# Patient Record
Sex: Female | Born: 1935
Health system: Southern US, Community
[De-identification: ages and names within clinical notes are randomized; demographics above are authoritative.]

## PROBLEM LIST (undated history)

## (undated) DIAGNOSIS — IMO0002 Reserved for concepts with insufficient information to code with codable children: Secondary | ICD-10-CM

## (undated) DIAGNOSIS — G4730 Sleep apnea, unspecified: Secondary | ICD-10-CM

## (undated) DIAGNOSIS — A048 Other specified bacterial intestinal infections: Secondary | ICD-10-CM

## (undated) DIAGNOSIS — K5790 Diverticulosis of intestine, part unspecified, without perforation or abscess without bleeding: Secondary | ICD-10-CM

## (undated) DIAGNOSIS — E039 Hypothyroidism, unspecified: Secondary | ICD-10-CM

## (undated) DIAGNOSIS — K219 Gastro-esophageal reflux disease without esophagitis: Secondary | ICD-10-CM

## (undated) DIAGNOSIS — I10 Essential (primary) hypertension: Secondary | ICD-10-CM

## (undated) DIAGNOSIS — K589 Irritable bowel syndrome without diarrhea: Secondary | ICD-10-CM

## (undated) DIAGNOSIS — J309 Allergic rhinitis, unspecified: Secondary | ICD-10-CM

## (undated) DIAGNOSIS — E782 Mixed hyperlipidemia: Secondary | ICD-10-CM

## (undated) DIAGNOSIS — G5600 Carpal tunnel syndrome, unspecified upper limb: Secondary | ICD-10-CM

## (undated) DIAGNOSIS — J45909 Unspecified asthma, uncomplicated: Secondary | ICD-10-CM

## (undated) DIAGNOSIS — K580 Irritable bowel syndrome with diarrhea: Secondary | ICD-10-CM

## (undated) DIAGNOSIS — M8580 Other specified disorders of bone density and structure, unspecified site: Secondary | ICD-10-CM

## (undated) DIAGNOSIS — R748 Abnormal levels of other serum enzymes: Secondary | ICD-10-CM

## (undated) DIAGNOSIS — R7989 Other specified abnormal findings of blood chemistry: Secondary | ICD-10-CM

## (undated) HISTORY — DX: Irritable bowel syndrome with diarrhea: K58.0

## (undated) HISTORY — DX: Hypothyroidism, unspecified: E03.9

## (undated) HISTORY — PX: UVULECTOMY: SHX2631

## (undated) HISTORY — DX: Gastro-esophageal reflux disease without esophagitis: K21.9

## (undated) HISTORY — DX: Essential (primary) hypertension: I10

## (undated) HISTORY — PX: DILATION AND CURETTAGE OF UTERUS: SHX78

## (undated) HISTORY — DX: Carpal tunnel syndrome, unspecified upper limb: G56.00

## (undated) HISTORY — PX: BREAST BIOPSY: SHX20

## (undated) HISTORY — DX: Other specified abnormal findings of blood chemistry: R79.89

## (undated) HISTORY — DX: Other specified disorders of bone density and structure, unspecified site: M85.80

## (undated) HISTORY — PX: TONSILLECTOMY AND ADENOIDECTOMY: SUR1326

## (undated) HISTORY — DX: Unspecified asthma, uncomplicated: J45.909

## (undated) HISTORY — DX: Irritable bowel syndrome, unspecified: K58.9

## (undated) HISTORY — DX: Mixed hyperlipidemia: E78.2

## (undated) HISTORY — DX: Other specified bacterial intestinal infections: A04.8

## (undated) HISTORY — PX: COLONOSCOPY: SHX174

## (undated) HISTORY — DX: Abnormal levels of other serum enzymes: R74.8

## (undated) HISTORY — DX: Diverticulosis of intestine, part unspecified, without perforation or abscess without bleeding: K57.90

## (undated) HISTORY — DX: Reserved for concepts with insufficient information to code with codable children: IMO0002

## (undated) HISTORY — DX: Sleep apnea, unspecified: G47.30

## (undated) HISTORY — PX: OTHER SURGICAL HISTORY: SHX169

## (undated) HISTORY — DX: Allergic rhinitis, unspecified: J30.9

---

## 1998-01-02 ENCOUNTER — Other Ambulatory Visit: Admission: RE | Admit: 1998-01-02 | Discharge: 1998-01-02 | Payer: Self-pay | Admitting: Obstetrics & Gynecology

## 1998-10-06 ENCOUNTER — Emergency Department (HOSPITAL_COMMUNITY): Admission: EM | Admit: 1998-10-06 | Discharge: 1998-10-06 | Payer: Self-pay | Admitting: Emergency Medicine

## 1999-02-13 ENCOUNTER — Other Ambulatory Visit: Admission: RE | Admit: 1999-02-13 | Discharge: 1999-02-13 | Payer: Self-pay | Admitting: Obstetrics & Gynecology

## 2000-10-27 ENCOUNTER — Other Ambulatory Visit: Admission: RE | Admit: 2000-10-27 | Discharge: 2000-10-27 | Payer: Self-pay | Admitting: Obstetrics & Gynecology

## 2001-12-18 ENCOUNTER — Other Ambulatory Visit: Admission: RE | Admit: 2001-12-18 | Discharge: 2001-12-18 | Payer: Self-pay | Admitting: Obstetrics & Gynecology

## 2003-02-15 ENCOUNTER — Other Ambulatory Visit: Admission: RE | Admit: 2003-02-15 | Discharge: 2003-02-15 | Payer: Self-pay | Admitting: Obstetrics & Gynecology

## 2004-01-31 ENCOUNTER — Encounter: Payer: Self-pay | Admitting: Internal Medicine

## 2004-04-24 ENCOUNTER — Encounter: Admission: RE | Admit: 2004-04-24 | Discharge: 2004-04-24 | Payer: Self-pay | Admitting: Obstetrics & Gynecology

## 2004-07-10 ENCOUNTER — Ambulatory Visit: Payer: Self-pay | Admitting: Internal Medicine

## 2004-07-24 ENCOUNTER — Ambulatory Visit: Payer: Self-pay | Admitting: Internal Medicine

## 2004-08-23 ENCOUNTER — Ambulatory Visit: Payer: Self-pay | Admitting: Internal Medicine

## 2004-10-19 ENCOUNTER — Ambulatory Visit: Payer: Self-pay | Admitting: Gastroenterology

## 2004-10-23 ENCOUNTER — Ambulatory Visit: Payer: Self-pay | Admitting: Internal Medicine

## 2004-10-31 ENCOUNTER — Ambulatory Visit: Payer: Self-pay | Admitting: Gastroenterology

## 2005-01-01 ENCOUNTER — Ambulatory Visit: Payer: Self-pay | Admitting: Internal Medicine

## 2005-02-15 ENCOUNTER — Ambulatory Visit: Payer: Self-pay | Admitting: Internal Medicine

## 2005-03-01 ENCOUNTER — Ambulatory Visit: Payer: Self-pay | Admitting: Internal Medicine

## 2005-06-19 ENCOUNTER — Other Ambulatory Visit: Admission: RE | Admit: 2005-06-19 | Discharge: 2005-06-19 | Payer: Self-pay | Admitting: Obstetrics & Gynecology

## 2005-09-05 ENCOUNTER — Ambulatory Visit: Payer: Self-pay | Admitting: Internal Medicine

## 2005-09-05 ENCOUNTER — Encounter: Admission: RE | Admit: 2005-09-05 | Discharge: 2005-09-05 | Payer: Self-pay | Admitting: Internal Medicine

## 2005-11-27 ENCOUNTER — Ambulatory Visit: Payer: Self-pay | Admitting: Internal Medicine

## 2005-12-22 ENCOUNTER — Emergency Department (HOSPITAL_COMMUNITY): Admission: EM | Admit: 2005-12-22 | Discharge: 2005-12-22 | Payer: Self-pay | Admitting: Family Medicine

## 2005-12-26 ENCOUNTER — Ambulatory Visit: Payer: Self-pay | Admitting: Internal Medicine

## 2006-06-19 ENCOUNTER — Ambulatory Visit: Payer: Self-pay | Admitting: Internal Medicine

## 2006-06-19 LAB — CONVERTED CEMR LAB
Basophils Relative: 0.8 % (ref 0.0–1.0)
Cholesterol: 168 mg/dL (ref 0–200)
HCT: 43.1 % (ref 36.0–46.0)
Hemoglobin: 14.2 g/dL (ref 12.0–15.0)
LDL Cholesterol: 90 mg/dL (ref 0–99)
MCHC: 33 g/dL (ref 30.0–36.0)
Monocytes Absolute: 0.4 10*3/uL (ref 0.2–0.7)
Neutro Abs: 3.4 10*3/uL (ref 1.4–7.7)
Neutrophils Relative %: 52.3 % (ref 43.0–77.0)
RBC: 4.61 M/uL (ref 3.87–5.11)
RDW: 12.9 % (ref 11.5–14.6)
WBC: 6.6 10*3/uL (ref 4.5–10.5)

## 2006-10-31 ENCOUNTER — Ambulatory Visit: Payer: Self-pay | Admitting: Internal Medicine

## 2006-11-05 ENCOUNTER — Ambulatory Visit: Payer: Self-pay | Admitting: Internal Medicine

## 2006-11-05 LAB — CONVERTED CEMR LAB
Basophils Absolute: 0 10*3/uL (ref 0.0–0.1)
Eosinophils Absolute: 0 10*3/uL (ref 0.0–0.6)
HCT: 42.5 % (ref 36.0–46.0)
Hemoglobin: 14.6 g/dL (ref 12.0–15.0)
Lymphocytes Relative: 8.2 % — ABNORMAL LOW (ref 12.0–46.0)
MCHC: 34.3 g/dL (ref 30.0–36.0)
MCV: 92.2 fL (ref 78.0–100.0)
Monocytes Relative: 4.8 % (ref 3.0–11.0)
Platelets: 307 10*3/uL (ref 150–400)
RBC: 4.61 M/uL (ref 3.87–5.11)

## 2006-11-06 ENCOUNTER — Encounter: Payer: Self-pay | Admitting: Internal Medicine

## 2006-11-24 DIAGNOSIS — G5600 Carpal tunnel syndrome, unspecified upper limb: Secondary | ICD-10-CM | POA: Insufficient documentation

## 2006-11-24 DIAGNOSIS — J45909 Unspecified asthma, uncomplicated: Secondary | ICD-10-CM

## 2006-12-09 ENCOUNTER — Ambulatory Visit: Payer: Self-pay | Admitting: Internal Medicine

## 2006-12-09 ENCOUNTER — Ambulatory Visit (HOSPITAL_BASED_OUTPATIENT_CLINIC_OR_DEPARTMENT_OTHER): Admission: RE | Admit: 2006-12-09 | Discharge: 2006-12-09 | Payer: Self-pay | Admitting: Internal Medicine

## 2006-12-16 ENCOUNTER — Ambulatory Visit: Payer: Self-pay | Admitting: Pulmonary Disease

## 2006-12-22 ENCOUNTER — Emergency Department (HOSPITAL_COMMUNITY): Admission: EM | Admit: 2006-12-22 | Discharge: 2006-12-22 | Payer: Self-pay | Admitting: Emergency Medicine

## 2006-12-24 ENCOUNTER — Ambulatory Visit: Payer: Self-pay | Admitting: Internal Medicine

## 2007-01-02 ENCOUNTER — Encounter: Admission: RE | Admit: 2007-01-02 | Discharge: 2007-02-09 | Payer: Self-pay | Admitting: Internal Medicine

## 2007-01-20 ENCOUNTER — Encounter: Payer: Self-pay | Admitting: Internal Medicine

## 2007-01-21 ENCOUNTER — Ambulatory Visit: Payer: Self-pay | Admitting: Internal Medicine

## 2007-01-21 DIAGNOSIS — IMO0002 Reserved for concepts with insufficient information to code with codable children: Secondary | ICD-10-CM

## 2007-01-25 ENCOUNTER — Encounter: Admission: RE | Admit: 2007-01-25 | Discharge: 2007-01-25 | Payer: Self-pay | Admitting: Internal Medicine

## 2007-01-28 ENCOUNTER — Telehealth (INDEPENDENT_AMBULATORY_CARE_PROVIDER_SITE_OTHER): Payer: Self-pay | Admitting: *Deleted

## 2007-01-28 DIAGNOSIS — R6889 Other general symptoms and signs: Secondary | ICD-10-CM | POA: Insufficient documentation

## 2007-02-05 ENCOUNTER — Encounter: Payer: Self-pay | Admitting: Internal Medicine

## 2007-02-06 ENCOUNTER — Telehealth (INDEPENDENT_AMBULATORY_CARE_PROVIDER_SITE_OTHER): Payer: Self-pay | Admitting: *Deleted

## 2007-02-09 ENCOUNTER — Telehealth (INDEPENDENT_AMBULATORY_CARE_PROVIDER_SITE_OTHER): Payer: Self-pay | Admitting: *Deleted

## 2007-02-18 ENCOUNTER — Telehealth (INDEPENDENT_AMBULATORY_CARE_PROVIDER_SITE_OTHER): Payer: Self-pay | Admitting: *Deleted

## 2007-02-19 ENCOUNTER — Encounter: Payer: Self-pay | Admitting: Internal Medicine

## 2007-03-18 ENCOUNTER — Encounter: Payer: Self-pay | Admitting: Internal Medicine

## 2007-03-31 ENCOUNTER — Ambulatory Visit: Payer: Self-pay | Admitting: Pulmonary Disease

## 2007-05-06 ENCOUNTER — Ambulatory Visit: Payer: Self-pay | Admitting: Pulmonary Disease

## 2007-05-14 ENCOUNTER — Ambulatory Visit: Payer: Self-pay | Admitting: Internal Medicine

## 2007-05-14 DIAGNOSIS — I10 Essential (primary) hypertension: Secondary | ICD-10-CM

## 2007-05-14 DIAGNOSIS — E039 Hypothyroidism, unspecified: Secondary | ICD-10-CM

## 2007-05-14 LAB — CONVERTED CEMR LAB
Cholesterol, target level: 200 mg/dL
LDL Goal: 130 mg/dL
TSH: 3.03 microintl units/mL (ref 0.35–5.50)

## 2007-05-15 ENCOUNTER — Encounter (INDEPENDENT_AMBULATORY_CARE_PROVIDER_SITE_OTHER): Payer: Self-pay | Admitting: *Deleted

## 2007-06-04 ENCOUNTER — Ambulatory Visit: Payer: Self-pay | Admitting: Internal Medicine

## 2007-06-09 ENCOUNTER — Encounter: Payer: Self-pay | Admitting: Internal Medicine

## 2007-06-09 DIAGNOSIS — G4730 Sleep apnea, unspecified: Secondary | ICD-10-CM | POA: Insufficient documentation

## 2007-07-03 ENCOUNTER — Encounter: Payer: Self-pay | Admitting: Internal Medicine

## 2007-07-06 ENCOUNTER — Telehealth (INDEPENDENT_AMBULATORY_CARE_PROVIDER_SITE_OTHER): Payer: Self-pay | Admitting: *Deleted

## 2007-07-24 ENCOUNTER — Ambulatory Visit: Payer: Self-pay | Admitting: Internal Medicine

## 2007-07-27 LAB — CONVERTED CEMR LAB
AST: 40 units/L — ABNORMAL HIGH (ref 0–37)
Cholesterol: 177 mg/dL (ref 0–200)
LDL Cholesterol: 96 mg/dL (ref 0–99)
Total CHOL/HDL Ratio: 3.7

## 2007-07-31 ENCOUNTER — Ambulatory Visit: Payer: Self-pay | Admitting: Internal Medicine

## 2007-07-31 DIAGNOSIS — E782 Mixed hyperlipidemia: Secondary | ICD-10-CM | POA: Insufficient documentation

## 2007-08-18 ENCOUNTER — Encounter (INDEPENDENT_AMBULATORY_CARE_PROVIDER_SITE_OTHER): Payer: Self-pay | Admitting: *Deleted

## 2007-09-03 ENCOUNTER — Ambulatory Visit: Payer: Self-pay | Admitting: Gastroenterology

## 2007-09-03 LAB — CONVERTED CEMR LAB
ALT: 21 units/L (ref 0–35)
AST: 27 units/L (ref 0–37)
Albumin: 4.1 g/dL (ref 3.5–5.2)
Alkaline Phosphatase: 68 units/L (ref 39–117)
Basophils Absolute: 0.1 10*3/uL (ref 0.0–0.1)
Basophils Relative: 0.9 % (ref 0.0–1.0)
Bilirubin, Direct: 0.1 mg/dL (ref 0.0–0.3)
Eosinophils Absolute: 0.1 10*3/uL (ref 0.0–0.6)
Eosinophils Relative: 1.4 % (ref 0.0–5.0)
Hemoglobin: 13.8 g/dL (ref 12.0–15.0)
Iron: 90 ug/dL (ref 42–145)
Lymphocytes Relative: 30.2 % (ref 12.0–46.0)
MCHC: 35.1 g/dL (ref 30.0–36.0)
Monocytes Absolute: 0.5 10*3/uL (ref 0.2–0.7)
Monocytes Relative: 7 % (ref 3.0–11.0)
Tissue Transglutaminase Ab, IgA: 0.4 units (ref <7)
Total Protein: 7.2 g/dL (ref 6.0–8.3)
Transferrin: 287.4 mg/dL (ref >=212.0)
Vitamin B-12: 315 pg/mL (ref 211–911)
WBC: 6.7 10*3/uL (ref 4.5–10.5)

## 2007-09-18 ENCOUNTER — Ambulatory Visit: Payer: Self-pay | Admitting: Gastroenterology

## 2007-12-07 ENCOUNTER — Ambulatory Visit: Payer: Self-pay | Admitting: Internal Medicine

## 2007-12-07 LAB — CONVERTED CEMR LAB
Albumin: 3.9 g/dL (ref 3.5–5.2)
Alkaline Phosphatase: 67 units/L (ref 39–117)
Bilirubin, Direct: 0.1 mg/dL (ref 0.0–0.3)
Cholesterol: 165 mg/dL (ref 0–200)
Creatinine, Ser: 0.8 mg/dL (ref 0.4–1.2)
GFR calc Af Amer: 90 mL/min
HDL: 45.3 mg/dL (ref >39.0)
Total Bilirubin: 0.7 mg/dL (ref 0.3–1.2)
Total CHOL/HDL Ratio: 3.6
Total Protein: 6.9 g/dL (ref 6.0–8.3)
VLDL: 28 mg/dL (ref 0–40)

## 2007-12-14 ENCOUNTER — Ambulatory Visit: Payer: Self-pay | Admitting: Internal Medicine

## 2007-12-24 ENCOUNTER — Telehealth: Payer: Self-pay | Admitting: Gastroenterology

## 2008-03-24 ENCOUNTER — Telehealth (INDEPENDENT_AMBULATORY_CARE_PROVIDER_SITE_OTHER): Payer: Self-pay | Admitting: *Deleted

## 2008-03-24 ENCOUNTER — Encounter: Payer: Self-pay | Admitting: Internal Medicine

## 2008-05-02 ENCOUNTER — Encounter (INDEPENDENT_AMBULATORY_CARE_PROVIDER_SITE_OTHER): Payer: Self-pay | Admitting: *Deleted

## 2008-05-30 ENCOUNTER — Telehealth (INDEPENDENT_AMBULATORY_CARE_PROVIDER_SITE_OTHER): Payer: Self-pay | Admitting: *Deleted

## 2008-06-07 ENCOUNTER — Ambulatory Visit: Payer: Self-pay | Admitting: Gastroenterology

## 2008-06-08 ENCOUNTER — Telehealth: Payer: Self-pay | Admitting: Gastroenterology

## 2008-06-22 ENCOUNTER — Telehealth: Payer: Self-pay | Admitting: Gastroenterology

## 2008-08-01 ENCOUNTER — Telehealth (INDEPENDENT_AMBULATORY_CARE_PROVIDER_SITE_OTHER): Payer: Self-pay | Admitting: *Deleted

## 2008-08-19 DIAGNOSIS — A048 Other specified bacterial intestinal infections: Secondary | ICD-10-CM

## 2008-08-19 HISTORY — DX: Other specified bacterial intestinal infections: A04.8

## 2008-08-24 ENCOUNTER — Telehealth (INDEPENDENT_AMBULATORY_CARE_PROVIDER_SITE_OTHER): Payer: Self-pay | Admitting: *Deleted

## 2008-08-25 ENCOUNTER — Telehealth (INDEPENDENT_AMBULATORY_CARE_PROVIDER_SITE_OTHER): Payer: Self-pay | Admitting: *Deleted

## 2008-08-26 ENCOUNTER — Ambulatory Visit: Payer: Self-pay | Admitting: Internal Medicine

## 2008-08-29 ENCOUNTER — Telehealth (INDEPENDENT_AMBULATORY_CARE_PROVIDER_SITE_OTHER): Payer: Self-pay | Admitting: *Deleted

## 2008-09-02 ENCOUNTER — Encounter: Payer: Self-pay | Admitting: Internal Medicine

## 2008-09-14 ENCOUNTER — Telehealth (INDEPENDENT_AMBULATORY_CARE_PROVIDER_SITE_OTHER): Payer: Self-pay | Admitting: *Deleted

## 2008-10-04 ENCOUNTER — Telehealth (INDEPENDENT_AMBULATORY_CARE_PROVIDER_SITE_OTHER): Payer: Self-pay | Admitting: *Deleted

## 2008-10-12 ENCOUNTER — Telehealth (INDEPENDENT_AMBULATORY_CARE_PROVIDER_SITE_OTHER): Payer: Self-pay | Admitting: *Deleted

## 2008-10-20 ENCOUNTER — Telehealth: Payer: Self-pay | Admitting: Gastroenterology

## 2008-10-21 ENCOUNTER — Ambulatory Visit: Payer: Self-pay | Admitting: Gastroenterology

## 2008-10-21 DIAGNOSIS — K589 Irritable bowel syndrome without diarrhea: Secondary | ICD-10-CM

## 2008-10-24 ENCOUNTER — Encounter: Payer: Self-pay | Admitting: Nurse Practitioner

## 2008-10-25 ENCOUNTER — Encounter: Payer: Self-pay | Admitting: Nurse Practitioner

## 2008-10-26 ENCOUNTER — Encounter: Payer: Self-pay | Admitting: Nurse Practitioner

## 2008-11-01 ENCOUNTER — Ambulatory Visit: Payer: Self-pay | Admitting: Internal Medicine

## 2008-11-02 LAB — CONVERTED CEMR LAB: TSH: 2.56 microintl units/mL (ref 0.35–5.50)

## 2008-11-03 ENCOUNTER — Encounter (INDEPENDENT_AMBULATORY_CARE_PROVIDER_SITE_OTHER): Payer: Self-pay | Admitting: *Deleted

## 2008-11-08 ENCOUNTER — Ambulatory Visit: Payer: Self-pay | Admitting: Gastroenterology

## 2008-11-08 DIAGNOSIS — K219 Gastro-esophageal reflux disease without esophagitis: Secondary | ICD-10-CM | POA: Insufficient documentation

## 2008-11-09 ENCOUNTER — Telehealth: Payer: Self-pay | Admitting: Gastroenterology

## 2008-11-14 ENCOUNTER — Encounter: Payer: Self-pay | Admitting: Gastroenterology

## 2008-11-14 ENCOUNTER — Ambulatory Visit: Payer: Self-pay | Admitting: Gastroenterology

## 2008-11-14 LAB — HM COLONOSCOPY

## 2008-11-16 ENCOUNTER — Encounter: Payer: Self-pay | Admitting: Gastroenterology

## 2008-11-22 ENCOUNTER — Telehealth: Payer: Self-pay | Admitting: Gastroenterology

## 2008-11-23 ENCOUNTER — Ambulatory Visit (HOSPITAL_COMMUNITY): Admission: RE | Admit: 2008-11-23 | Discharge: 2008-11-23 | Payer: Self-pay | Admitting: Gastroenterology

## 2008-11-28 ENCOUNTER — Encounter: Payer: Self-pay | Admitting: Gastroenterology

## 2008-12-05 ENCOUNTER — Telehealth: Payer: Self-pay | Admitting: Gastroenterology

## 2008-12-06 ENCOUNTER — Telehealth (INDEPENDENT_AMBULATORY_CARE_PROVIDER_SITE_OTHER): Payer: Self-pay | Admitting: *Deleted

## 2008-12-07 ENCOUNTER — Encounter: Payer: Self-pay | Admitting: Internal Medicine

## 2008-12-30 ENCOUNTER — Telehealth (INDEPENDENT_AMBULATORY_CARE_PROVIDER_SITE_OTHER): Payer: Self-pay | Admitting: *Deleted

## 2009-01-13 ENCOUNTER — Ambulatory Visit: Payer: Self-pay | Admitting: Internal Medicine

## 2009-01-13 DIAGNOSIS — R198 Other specified symptoms and signs involving the digestive system and abdomen: Secondary | ICD-10-CM

## 2009-01-27 ENCOUNTER — Telehealth (INDEPENDENT_AMBULATORY_CARE_PROVIDER_SITE_OTHER): Payer: Self-pay | Admitting: *Deleted

## 2009-02-10 ENCOUNTER — Telehealth (INDEPENDENT_AMBULATORY_CARE_PROVIDER_SITE_OTHER): Payer: Self-pay | Admitting: *Deleted

## 2009-03-08 ENCOUNTER — Telehealth: Payer: Self-pay | Admitting: Gastroenterology

## 2009-04-17 ENCOUNTER — Telehealth (INDEPENDENT_AMBULATORY_CARE_PROVIDER_SITE_OTHER): Payer: Self-pay | Admitting: *Deleted

## 2009-04-18 ENCOUNTER — Ambulatory Visit: Payer: Self-pay | Admitting: Internal Medicine

## 2009-04-18 DIAGNOSIS — J309 Allergic rhinitis, unspecified: Secondary | ICD-10-CM | POA: Insufficient documentation

## 2009-04-25 ENCOUNTER — Encounter (INDEPENDENT_AMBULATORY_CARE_PROVIDER_SITE_OTHER): Payer: Self-pay | Admitting: *Deleted

## 2009-04-25 LAB — CONVERTED CEMR LAB
Basophils Absolute: 0.1 10*3/uL (ref 0.0–0.1)
Basophils Relative: 0.8 % (ref 0.0–3.0)
Eosinophils Relative: 0.6 % (ref 0.0–5.0)
HCT: 40.3 % (ref 36.0–46.0)
Lymphs Abs: 2.2 10*3/uL (ref 0.7–4.0)
MCV: 94.6 fL (ref 78.0–100.0)
Monocytes Relative: 5.7 % (ref 3.0–12.0)
Neutro Abs: 4.5 10*3/uL (ref 1.4–7.7)
RDW: 13.3 % (ref 11.5–14.6)

## 2010-04-04 ENCOUNTER — Ambulatory Visit: Payer: Self-pay | Admitting: Internal Medicine

## 2010-04-12 ENCOUNTER — Telehealth (INDEPENDENT_AMBULATORY_CARE_PROVIDER_SITE_OTHER): Payer: Self-pay | Admitting: *Deleted

## 2010-04-20 ENCOUNTER — Telehealth: Payer: Self-pay | Admitting: Internal Medicine

## 2010-05-10 ENCOUNTER — Telehealth (INDEPENDENT_AMBULATORY_CARE_PROVIDER_SITE_OTHER): Payer: Self-pay | Admitting: *Deleted

## 2010-05-15 ENCOUNTER — Telehealth (INDEPENDENT_AMBULATORY_CARE_PROVIDER_SITE_OTHER): Payer: Self-pay | Admitting: *Deleted

## 2010-06-05 ENCOUNTER — Telehealth (INDEPENDENT_AMBULATORY_CARE_PROVIDER_SITE_OTHER): Payer: Self-pay | Admitting: *Deleted

## 2010-06-05 ENCOUNTER — Encounter: Payer: Self-pay | Admitting: Internal Medicine

## 2010-06-05 DIAGNOSIS — M949 Disorder of cartilage, unspecified: Secondary | ICD-10-CM

## 2010-06-05 DIAGNOSIS — M899 Disorder of bone, unspecified: Secondary | ICD-10-CM | POA: Insufficient documentation

## 2010-06-12 ENCOUNTER — Telehealth (INDEPENDENT_AMBULATORY_CARE_PROVIDER_SITE_OTHER): Payer: Self-pay | Admitting: *Deleted

## 2010-06-13 ENCOUNTER — Encounter: Payer: Self-pay | Admitting: Internal Medicine

## 2010-06-19 ENCOUNTER — Ambulatory Visit: Payer: Self-pay | Admitting: Internal Medicine

## 2010-06-25 LAB — CONVERTED CEMR LAB
AST: 32 units/L (ref 0–37)
Alkaline Phosphatase: 65 units/L (ref 39–117)
Bilirubin, Direct: 0.1 mg/dL (ref 0.0–0.3)
Total CHOL/HDL Ratio: 3
Total CK: 91 units/L (ref 7–177)
Triglycerides: 115 mg/dL (ref 0.0–149.0)

## 2010-06-26 ENCOUNTER — Ambulatory Visit: Payer: Self-pay | Admitting: Internal Medicine

## 2010-09-16 LAB — CONVERTED CEMR LAB
ALT: 17 units/L (ref 0–35)
Albumin: 4.3 g/dL (ref 3.5–5.2)
BUN: 14 mg/dL (ref 6–23)
Basophils Relative: 0.8 % (ref 0.0–3.0)
Bilirubin, Direct: 0.1 mg/dL (ref 0.0–0.3)
Eosinophils Absolute: 0 10*3/uL (ref 0.0–0.7)
GFR calc non Af Amer: 68.93 mL/min (ref >60)
Glucose, Bld: 95 mg/dL (ref 70–99)
HCT: 40.4 % (ref 36.0–46.0)
HDL: 49.7 mg/dL (ref >39.00)
Hemoglobin: 13.6 g/dL (ref 12.0–15.0)
Lymphs Abs: 2.3 10*3/uL (ref 0.7–4.0)
Monocytes Absolute: 0.4 10*3/uL (ref 0.1–1.0)
Monocytes Relative: 5.6 % (ref 3.0–12.0)
Neutro Abs: 3.8 10*3/uL (ref 1.4–7.7)
Neutrophils Relative %: 58.2 % (ref 43.0–77.0)
Platelets: 249 10*3/uL (ref 150.0–400.0)
RBC: 4.24 M/uL (ref 3.87–5.11)
Sodium: 143 meq/L (ref 135–145)
Total CHOL/HDL Ratio: 6
Total Protein: 7.2 g/dL (ref 6.0–8.3)
VLDL: 38.8 mg/dL (ref 0.0–40.0)

## 2010-09-18 ENCOUNTER — Other Ambulatory Visit: Payer: Self-pay | Admitting: Internal Medicine

## 2010-09-18 ENCOUNTER — Ambulatory Visit
Admission: RE | Admit: 2010-09-18 | Discharge: 2010-09-18 | Payer: Self-pay | Source: Home / Self Care | Attending: Internal Medicine | Admitting: Internal Medicine

## 2010-09-18 LAB — LIPID PANEL
Cholesterol: 192 mg/dL (ref 0–200)
HDL: 52 mg/dL (ref >39.00)
Triglycerides: 160 mg/dL — ABNORMAL HIGH (ref 0.0–149.0)

## 2010-09-18 LAB — HEPATIC FUNCTION PANEL
ALT: 19 U/L (ref 0–35)
AST: 26 U/L (ref 0–37)
Albumin: 4 g/dL (ref 3.5–5.2)
Alkaline Phosphatase: 57 U/L (ref 39–117)

## 2010-09-18 NOTE — Progress Notes (Signed)
Summary: Order request  Phone Note From Other Clinic   Summary of Call: Order Request for BMD, patient was at the breast center getting mammogram and requested Bone Density also.  Order faxed to [phone]  Shonna Chock CMA  June 05, 2010 2:52 PM   New Problems: OSTEOPENIA (ICD-733.90)   New Problems: OSTEOPENIA (ICD-733.90)

## 2010-09-18 NOTE — Progress Notes (Signed)
Summary: refill   Phone Note Refill Request Message from:  Fax from Pharmacy on May 10, 2010 11:57 AM  Refills Requested: Medication #1:  CARDIZEM CD 120 MG XR24H-CAP take 1 tab once daily [facility] - fax [phone]  Initial call taken by: Okey Regal Spring,  May 10, 2010 11:58 AM    Prescriptions: CARDIZEM CD 120 MG XR24H-CAP (DILTIAZEM HCL COATED BEADS) take 1 tab once daily  #90 Each x 3   Entered by:   Shonna Chock CMA   Authorized by:   Marga Melnick MD   Signed by:   Shonna Chock CMA on 05/10/2010   Method used:   Electronically to        [facility]* (retail)       [address]       [place], [place]  [address]       Ph: [phone]       Fax: [phone]   RxID:   [id number]

## 2010-09-18 NOTE — Progress Notes (Signed)
Summary: change med back  Phone Note Call from Patient   Caller: Patient Summary of Call: Pt states that she has found a way to take ZOCOR 40 MG TABLET and has been taking this med for the past few weeks with no adverse reaction. pt states that she is very hesitate to take the welchol due to some of the side effect it has list on the insert. pt also wanted to let dr hopper know that when previously on this med she was taking it in the morning which could have contribute to her adverse reaction. pls advise ok to change med back.............Marland KitchenFelecia Deloach CMA  April 20, 2010 10:17 AM    Follow-up for Phone Call        Instead of Welchol; continue Simvastatin 40 mg as she is doing. Check fasting labs after 10 weeks & see me 2-3 days later(272.4,995.20; lipids, hepatic panel, CPK) Follow-up by: Marga Melnick MD,  April 20, 2010 11:06 AM  Additional Follow-up for Phone Call Additional follow up Details #1::        pt aware, appt scheduled already............Marland KitchenFelecia Deloach CMA  April 20, 2010 11:49 AM     New/Updated Medications: ZOCOR 40 MG TABS (SIMVASTATIN) Take 1 tab at bedtime

## 2010-09-18 NOTE — Progress Notes (Signed)
Summary: refill  Phone Note Call from Patient Call back at Home Phone [phone]   Caller: Patient Summary of Call: pt left vm that she just received labs in mail but wanted to let dr hopper know that she was not fasting when labs were drawn. pt is however interesting in starting the welchol. dr hopper pls advise...........Marland KitchenFelecia Deloach CMA  April 12, 2010 10:54 AM   Follow-up for Phone Call        check FASTNG lipids, CK & hepatic panel 10 weeks after meds started(272.4,995.20) Follow-up by: Marga Melnick MD,  April 12, 2010 1:01 PM  Additional Follow-up for Phone Call Additional follow up Details #1::        patient is scheduled for lab [id number] -  patient said she didnt get rx for welchol wants called in to [facility] .Marland KitchenOkey Regal Spring  April 12, 2010 4:08 PM      Prescriptions: WELCHOL 3.75 GM PACK (COLESEVELAM HCL) 1 pack once daily in water  #1 month x 5   Entered by:   Shonna Chock CMA   Authorized by:   Marga Melnick MD   Signed by:   Shonna Chock CMA on 04/12/2010   Method used:   Electronically to        [facility]* (retail)       [address]       [place], [place]  [id number]       Ph: [phone]       Fax: [phone]   RxID:   [id number]

## 2010-09-18 NOTE — Progress Notes (Signed)
Summary: REFILL  Phone Note Refill Request Message from:  Fax from Pharmacy on June 12, 2010 11:22 AM  Refills Requested: Medication #1:  LEVOTHROID 25 MCG TABS 1 qd [facility] - FAX [phone]  Initial call taken by: Okey Regal Spring,  June 12, 2010 11:23 AM    Prescriptions: LEVOTHROID 25 MCG TABS (LEVOTHYROXINE SODIUM) 1 qd  #90 Each x 2   Entered by:   Shonna Chock CMA   Authorized by:   Marga Melnick MD   Signed by:   Shonna Chock CMA on 06/12/2010   Method used:   Electronically to        [facility]* (retail)       [address]       [place], [place]  [address]       Ph: [phone]       Fax: [phone]   RxID:   [id number]

## 2010-09-18 NOTE — Assessment & Plan Note (Signed)
Summary: med refill/cbs   Vital Signs:  Patient profile:   77 year old female Height:      64 inches (162.56 cm) Weight:      146.25 pounds (66.48 kg) BMI:     25.19 Temp:     98.2 degrees F (36.78 degrees C) oral Resp:     15 per minute BP sitting:   130 / 60  (left arm) Cuff size:   large  Vitals Entered By: Lucious Groves CMA (April 04, 2010 10:56 AM) CC: Med refill./kb, Lipid Management Is Patient Diabetic? No Pain Assessment Patient in pain? no      Comments Refill Lisinopril, patient is unsure about Diltiazem./kb   Primary Care Provider:  Marga Melnick, MD  CC:  Med refill./kb and Lipid Management.  History of Present Illness: Here for Medicare AWV: 1.Risk factors based on Past M, S, F history:Asthma;hypothyroidism; HTN; hyperlipidemia; IBS(chart updated) 2.Physical Activities: see data 3.Depression/mood:anxious about IBS  4.Hearing: Hearing decreased to whisper @ 6 ft, L> R 5.ADL's: no limitations 6.Fall Risk: denied 7.Home Safety: none  8.Height, weight, &visual acuity:wall chart read @ 6 ft 9.Counseling: none requested; POA / Living Will up to date 10.Labs ordered based on risk factors: see Orders 11 Referral Coordination:none requested ; I recommended Audiology evaluation 12.Care Plan: see Instructions 13. Cognitive Assessment: Orientation X 3 ; memory & recall excellent  ; "WORLD " spelled backwards; affect & mood normal. Hyperlipidemia Follow-Up      This is a 77 year old woman who presents for Hyperlipidemia follow-up.Statin D/Ced due to IBS flare.  Other symptoms include dypsnea due to asthma  and  occasional pedal edema.  The patient denies the following symptoms: chest pain/pressure, exercise intolerance, palpitations, and syncope.  Dietary compliance has been fair.  The patient reports exercising occasionally.  Adjunctive measures currently used by the patient include fiber.  Hypertension Follow-Up      The patient also presents for Hypertension  follow-up.  The patient reports urinary frequency and fatigue( improved with B12 by mouth), but denies lightheadedness and headaches.  Compliance with medications (by patient report) has been near 100%.  Adjunctive measures currently used by the patient include salt restriction.  BP not checked @ home.  Lipid Management History:      Positive NCEP/ATP III risk factors include female age 34 years old or older, early menopause without estrogen hormone replacement, and hypertension.  Negative NCEP/ATP III risk factors include non-diabetic, no family history for ischemic heart disease, non-tobacco-user status, no ASHD (atherosclerotic heart disease), no prior stroke/TIA, no peripheral vascular disease, and no history of aortic aneurysm.     Preventive Screening-Counseling & Management  Alcohol-Tobacco     Alcohol drinks/day: <1     Smoking Status: never  Caffeine-Diet-Exercise     Caffeine use/day: decaf coffee , 2 cups/ day     Diet Comments: heart healthy diet     Does Patient Exercise: yes     Type of exercise: walking      Times/week: 2X  Hep-HIV-STD-Contraception     Dental Visit-last 6 months yes     Sun Exposure-Excessive: no  Safety-Violence-Falls     Seat Belt Use: yes     Firearms in the Home: firearms in the home     Firearm Counseling: not indicated; uses recommended firearm safety measures     Smoke Detectors: yes     Violence in the Home: no risk noted     Sexual Abuse: no     Fall Risk:  none      Sexual History:  currently monogamous.        Drug Use:  never.        Blood Transfusions:  no.        Travel History:  none in > 12 months.    Current Medications (verified): 1)  Cardizem Cd 120 Mg Xr24h-Cap (Diltiazem Hcl Coated Beads) .... Take 1 Tab Once Daily 2)  Xopenex 1.25 Mg/92ml Nebu (Levalbuterol Hcl) .... Two Times A Day Prn 3)  Pulmicort 0.5 Mg/3ml Susp (Budesonide) .... Use 1 Ampule in Nebulizer Two Times A Day As Needed 4)  Lisinopril 10 Mg  Tabs  (Lisinopril) .Marland Kitchen.. 1 Once Daily **appointment Due** 5)  Levothroid 25 Mcg Tabs (Levothyroxine Sodium) .Marland Kitchen.. 1 Qd 6)  Proair Hfa 108 (90 Base) Mcg/act Aers (Albuterol Sulfate) .Marland Kitchen.. 1-2 Pufss Two Times A Day As Needed 7)  Symbicort 160-4.5 Mcg/act Aero (Budesonide-Formoterol Fumarate) .Marland Kitchen.. 1-2 Puffs Two Times A Day As Needed 8)  Calcium 500/d 500-200 Mg-Unit Tabs (Calcium Carbonate-Vitamin D) .Marland Kitchen.. 1 By Mouth Once Daily (When Patient Thinks About It) 9)  Coq10 100 Mg Caps (Coenzyme Q10) .Marland Kitchen.. 1 By Mouth Once Daily 10)  Vitamin D 1000 Unit Tabs (Cholecalciferol) .Marland Kitchen.. 1 By Mouth Once Daily  Allergies (verified): 1)  ! Zocor 2)  ! Lipitor 3)  ! Norvasc 4)  ! * Niaspan 5)  ! * Hctz  Past History:  Past Medical History: Asthma Hyperlipidemia: Framingham Study LDL goal = < 130. Hypertension Hypothyroidism elevated homocysteine levl with high normal C reactive protein sleep apnea, borderline, CPAP intolerant IBS ; +CloTest 10/2008 radiculopathy carpal tunnel syndrome Allergic rhinitis, Dr [place] Callas  Past Surgical History: breast biopsy D AND C Tonsillectomy colonoscopy 1995,2000,2006,2010; Endoscopy  2010: H pylori G 2 P 2 otic cysts uvulectomy age 77  Family History: mother: liver failure, htn; father: health hx unknown; M aunts :COPD; M aunt : HTN, CVA No FH of Colon Cancer:  Social History: Caffeine use/day:  decaf coffee , 2 cups/ day Does Patient Exercise:  yes Dental Care w/in 6 mos.:  yes Sun Exposure-Excessive:  no Seat Belt Use:  yes Fall Risk:  none Sexual History:  currently monogamous Drug Use:  never Blood Transfusions:  no  Review of Systems General:  Denies sleep disorder and weight loss. Eyes:  Complains of blurring; denies double vision and vision loss-both eyes; Ophth exam due. ENT:  Denies difficulty swallowing and hoarseness. GI:  Complains of constipation; denies diarrhea; Alternating  bowel changes. GU:  Denies discharge, dysuria, and hematuria. MS:   Complains of low back pain; denies joint pain, mid back pain, and thoracic pain. Derm:  Complains of lesion(s); denies dryness and hair loss; Scalp lesion which appeared 2 weeks ago   has shrunken  after slight bloody discharge; scab persists. Neuro:  Denies disturbances in coordination, numbness, and tingling. Endo:  Complains of cold intolerance; denies heat intolerance. Heme:  Denies abnormal bruising and bleeding. Allergy:  Dr [place] Callas treating asthma & allergies.  Physical Exam  General:  well-nourished,appears younger than age; alert,appropriate and cooperative throughout examination Head:  Normocephalic and atraumatic without obvious abnormalities. 4X4 mm eschar over crown Eyes:  No corneal or conjunctival inflammation noted. EOMI. Perrla.No lid lag Ears:  External ear exam shows no significant lesions or deformities.  Otoscopic examination reveals clear canals, tympanic membranes are intact bilaterally without bulging, retraction, inflammation or discharge. Hearing is grossly normal bilaterally. Nose:  External nasal examination shows no deformity or inflammation. Nasal mucosa  are pink and moist without lesions or exudates. Mouth:  Oral mucosa and oropharynx without lesions or exudates.  Teeth in good repair. Neck:  No deformities, masses, or tenderness noted. Lungs:  Normal respiratory effort, chest expands symmetrically. Lungs are clear to auscultation, no crackles or wheezes. Heart:  Normal rate and regular rhythm. S1 and S2 normal without gallop, murmur, click, rub. S4 Abdomen:  Bowel sounds positive,abdomen soft and non-tender without masses, organomegaly or hernias noted. Genitalia:  Dr Arlyce Dice Msk:  No deformity or scoliosis noted of thoracic or lumbar spine.   Pulses:  R and L carotid,radia,dorsalis pedis and posterior tibial pulses are full and equal bilaterally Extremities:  No clubbing, cyanosis, edema. Minor OA hand changes Neurologic:  alert & oriented X3 and DTRs symmetrical  and normal.   Skin:  See scalp  Cervical Nodes:  No lymphadenopathy noted Axillary Nodes:  No palpable lymphadenopathy Psych:  memory intact for recent and remote, normally interactive, good eye contact, not anxious appearing, and not depressed appearing.     Impression & Recommendations:  Problem # 1:  PREVENTIVE HEALTH CARE (ICD-V70.0)  Orders: [facility] -Subsequent Annual Wellness Visit [phone])  Problem # 2:  HYPERLIPIDEMIA (ICD-272.2)  Orders: Venipuncture (98119) TLB-Lipid Panel (80061-LIPID) TLB-Hepatic/Liver Function Pnl (80076-HEPATIC)  Problem # 3:  HYPERTENSION, ESSENTIAL NOS (ICD-401.9)  Her updated medication list for this problem includes:    Cardizem Cd 120 Mg Xr24h-cap (Diltiazem hcl coated beads) .Marland Kitchen... Take 1 tab once daily    Lisinopril 10 Mg Tabs (Lisinopril) .Marland Kitchen... 1 once daily  Orders: EKG w/ Interpretation (93000) Venipuncture (14782) TLB-BMP (Basic Metabolic Panel-BMET) (80048-METABOL)  Problem # 4:  HYPOTHYROIDISM (ICD-244.9)  Her updated medication list for this problem includes:    Levothroid 25 Mcg Tabs (Levothyroxine sodium) .Marland Kitchen... 1 qd  Orders: Venipuncture (95621) TLB-TSH (Thyroid Stimulating Hormone) (84443-TSH)  Problem # 5:  IRRITABLE BOWEL SYNDROME (ICD-564.1)  Orders: Venipuncture (30865) TLB-CBC Platelet - w/Differential (85025-CBCD)  Problem # 6:  ASTHMA (ICD-493.90) as per Dr [place] Callas Her updated medication list for this problem includes:    Xopenex 1.25 Mg/15ml Nebu (Levalbuterol hcl) .Marland Kitchen..Marland Kitchen Two times a day prn    Pulmicort 0.5 Mg/37ml Susp (Budesonide) ..... Use 1 ampule in nebulizer two times a day as needed    Proair Hfa 108 (90 Base) Mcg/act Aers (Albuterol sulfate) .Marland Kitchen... 1-2 pufss two times a day as needed    Symbicort 160-4.5 Mcg/act Aero (Budesonide-formoterol fumarate) .Marland Kitchen... 1-2 puffs two times a day as needed  Complete Medication List: 1)  Cardizem Cd 120 Mg Xr24h-cap (Diltiazem hcl coated beads) .... Take 1 tab once daily 2)   Xopenex 1.25 Mg/92ml Nebu (Levalbuterol hcl) .... Two times a day prn 3)  Pulmicort 0.5 Mg/38ml Susp (Budesonide) .... Use 1 ampule in nebulizer two times a day as needed 4)  Lisinopril 10 Mg Tabs (Lisinopril) .Marland Kitchen.. 1 once daily 5)  Levothroid 25 Mcg Tabs (Levothyroxine sodium) .Marland Kitchen.. 1 qd 6)  Proair Hfa 108 (90 Base) Mcg/act Aers (Albuterol sulfate) .Marland Kitchen.. 1-2 pufss two times a day as needed 7)  Symbicort 160-4.5 Mcg/act Aero (Budesonide-formoterol fumarate) .Marland Kitchen.. 1-2 puffs two times a day as needed 8)  Calcium 500/d 500-200 Mg-unit Tabs (Calcium carbonate-vitamin d) .Marland Kitchen.. 1 by mouth once daily (when patient thinks about it) 9)  Coq10 100 Mg Caps (Coenzyme q10) .Marland Kitchen.. 1 by mouth once daily 10)  Vitamin D 1000 Unit Tabs (Cholecalciferol) .Marland Kitchen.. 1 by mouth once daily  Lipid Assessment/Plan:      Based on NCEP/ATP III,  the patient's risk factor category is "0-1 risk factors".  The patient's lipid goals are as follows: Total cholesterol goal is 200; LDL cholesterol goal is 130; HDL cholesterol goal is 40; Triglyceride goal is 150.  Her LDL cholesterol goal has been met.    Patient Instructions: 1)  Trial of Align once daily as needed for IBS.Consider Audiology evaluation for hearing assessment. 2)  Schedule your mammogram & Bone Density as discussed. 3)  Check your Blood Pressure regularly. If it is above: 140/90 ON AVERAGE you should make an appointment. Prescriptions: LISINOPRIL 10 MG  TABS (LISINOPRIL) 1 once daily  #90 x 3   Entered by:   Lucious Groves CMA   Authorized by:   Marga Melnick MD   Signed by:   Lucious Groves CMA on 04/04/2010   Method used:   Print then Give to Patient   RxID:   [id number]

## 2010-09-18 NOTE — Letter (Signed)
Summary: [facility]  [facility]   Imported By: Lanelle Bal 07/02/2010 12:46:03  _____________________________________________________________________  External Attachment:    Type:   Image     Comment:   External Document

## 2010-09-18 NOTE — Assessment & Plan Note (Signed)
Summary: review lab/cbs   Vital Signs:  Patient profile:   77 year old female Weight:      145 pounds BMI:     24.98 Pulse rate:   72 / minute Resp:     16 per minute BP sitting:   128 / 70  (left arm) Cuff size:   large  Vitals Entered By: Shonna Chock CMA (June 26, 2010 2:56 PM) CC: Follow-up visit: discuss labs-copy given, Lipid Management   Primary Care Provider:  Marga Melnick, MD  CC:  Follow-up visit: discuss labs-copy given and Lipid Management.  History of Present Illness: Hyperlipidemia Follow-Up      This is a 77 year old woman who presents for Hyperlipidemia follow-up.  The patient reports muscle  cramps with stretching in am  and fatigue, but denies GI upset, abdominal pain, flushing, itching, constipation, and diarrhea.  Other symptoms include dypsnea from asthma  and  occasional palpitations.  The patient denies the following symptoms: chest pain/pressure, exercise intolerance, syncope, and pedal edema.  Compliance with medications (by patient report) has been near 100%.  Dietary compliance has been good.  The patient reports exercising 2-3 X per week.  Adjunctive measures currently used by the patient include ASA and fish oil supplements.   She is on Simvastatin 40 mg & Diltiazem 120 mg.  Lipid Management History:      Positive NCEP/ATP III risk factors include female age 62 years old or older, early menopause without estrogen hormone replacement, and hypertension.  Negative NCEP/ATP III risk factors include non-diabetic, no family history for ischemic heart disease, non-tobacco-user status, no ASHD (atherosclerotic heart disease), no prior stroke/TIA, no peripheral vascular disease, and no history of aortic aneurysm.     Current Medications (verified): 1)  Cardizem Cd 120 Mg Xr24h-Cap (Diltiazem Hcl Coated Beads) .... Take 1 Tab Once Daily 2)  Xopenex 1.25 Mg/47ml Nebu (Levalbuterol Hcl) .... Two Times A Day Prn 3)  Pulmicort 0.5 Mg/8ml Susp (Budesonide) .... Use  1 Ampule in Nebulizer Two Times A Day As Needed 4)  Lisinopril 10 Mg  Tabs (Lisinopril) .Marland Kitchen.. 1 Once Daily 5)  Levothroid 25 Mcg Tabs (Levothyroxine Sodium) .Marland Kitchen.. 1 Qd 6)  Proair Hfa 108 (90 Base) Mcg/act Aers (Albuterol Sulfate) .Marland Kitchen.. 1-2 Pufss Two Times A Day As Needed 7)  Symbicort 160-4.5 Mcg/act Aero (Budesonide-Formoterol Fumarate) .Marland Kitchen.. 1-2 Puffs Two Times A Day 8)  Calcium 500/d 500-200 Mg-Unit Tabs (Calcium Carbonate-Vitamin D) .Marland Kitchen.. 1 By Mouth Once Daily (When Patient Thinks About It) 9)  Coq10 100 Mg Caps (Coenzyme Q10) .Marland Kitchen.. 1 By Mouth Once Daily 10)  Vitamin D 1000 Unit Tabs (Cholecalciferol) .Marland Kitchen.. 1 By Mouth Once Daily 11)  Zocor 40 Mg Tabs (Simvastatin) .... Take 1 Tab At Bedtime  Allergies: 1)  ! Lipitor 2)  ! Norvasc 3)  ! * Niaspan 4)  ! * Hctz  Past History:  Past Medical History: Asthma Hyperlipidemia: Framingham Study LDL goal = < 130. NMR Lipoprofile 2005: LDL 69( 1233/852), TG  154. LDL goal = , 80. Hypertension Hypothyroidism elevated homocysteine level with high normal C reactive protein sleep apnea, borderline, CPAP intolerant IBS ; +CloTest 10/2008 radiculopathy Carpal tunnel syndrome Allergic rhinitis, Dr [place] Callas  Family History: mother: liver failure, htn; father: health hx unknown; M aunts :COPD; M aunt : HTN, CVA No FH of Colon Cancer: No premature MI  Physical Exam  General:  well-nourished; appears much younger than age; alert,appropriate and cooperative throughout examination Heart:  Normal rate and regular  rhythm. S1 and S2 normal without gallop, murmur, click, rub . Pulses:  R and L carotid,radial,dorsalis pedis and posterior tibial pulses are full and equal bilaterally Extremities:  No clubbing, cyanosis, edema.  Psych:  memory intact for recent and remote, normally interactive, and good eye contact. Focused & intelligent   Impression & Recommendations:  Problem # 1:  HYPERLIPIDEMIA (ICD-272.2)  The following medications were removed from the  medication list:    Zocor 40 Mg Tabs (Simvastatin) .Marland Kitchen... Take 1 tab at bedtime Her updated medication list for this problem includes:    Pravastatin Sodium 40 Mg Tabs (Pravastatin sodium) .Marland Kitchen... 1 at bedtime  Complete Medication List: 1)  Cardizem Cd 120 Mg Xr24h-cap (Diltiazem hcl coated beads) .... Take 1 tab once daily 2)  Xopenex 1.25 Mg/35ml Nebu (Levalbuterol hcl) .... Two times a day prn 3)  Pulmicort 0.5 Mg/57ml Susp (Budesonide) .... Use 1 ampule in nebulizer two times a day as needed 4)  Lisinopril 10 Mg Tabs (Lisinopril) .Marland Kitchen.. 1 once daily 5)  Levothroid 25 Mcg Tabs (Levothyroxine sodium) .Marland Kitchen.. 1 qd 6)  Proair Hfa 108 (90 Base) Mcg/act Aers (Albuterol sulfate) .Marland Kitchen.. 1-2 pufss two times a day as needed 7)  Symbicort 160-4.5 Mcg/act Aero (Budesonide-formoterol fumarate) .Marland Kitchen.. 1-2 puffs two times a day 8)  Calcium 500/d 500-200 Mg-unit Tabs (Calcium carbonate-vitamin d) .Marland Kitchen.. 1 by mouth once daily (when patient thinks about it) 9)  Coq10 100 Mg Caps (Coenzyme q10) .Marland Kitchen.. 1 by mouth once daily 10)  Vitamin D 1000 Unit Tabs (Cholecalciferol) .Marland Kitchen.. 1 by mouth once daily 11)  Pravastatin Sodium 40 Mg Tabs (Pravastatin sodium) .Marland Kitchen.. 1 at bedtime  Lipid Assessment/Plan:      Based on NCEP/ATP III, the patient's risk factor category is "2 or more risk factors and a calculated 10 year CAD risk of < 20%".  The patient's lipid goals are as follows: Total cholesterol goal is 200; LDL cholesterol goal is 130; HDL cholesterol goal is 40; Triglyceride goal is 150.  Her LDL cholesterol goal has been met.    Patient Instructions: 1)  Please schedule a follow-up appointment in 3 months. 2)  Hepatic Panel prior to visit, ICD-9:995.20 3)  Lipid Panel prior to visit, ICD-9:272.4 Prescriptions: PRAVASTATIN SODIUM 40 MG TABS (PRAVASTATIN SODIUM) 1 at bedtime  #90 x 1   Entered and Authorized by:   Marga Melnick MD   Signed by:   Marga Melnick MD on 06/26/2010   Method used:   Print then Give to Patient   RxID:    [id number]    Orders Added: 1)  Est. Patient Level III [14782]

## 2010-09-18 NOTE — Progress Notes (Signed)
Summary: Refill Request  Phone Note Refill Request Call back at [phone] Message from:  Pharmacy on May 15, 2010 11:06 AM  Refills Requested: Medication #1:  ZOCOR 40 MG TABS Take 1 tab at bedtime.   Dosage confirmed as above?Dosage Confirmed   Brand Name Necessary? No   Supply Requested: 1 month   Last Refilled: 10/14/2008 [facility]  Next Appointment Scheduled: 11.1.11 for labs Initial call taken by: Harold Barban,  May 15, 2010 11:07 AM    Prescriptions: ZOCOR 40 MG TABS (SIMVASTATIN) Take 1 tab at bedtime  #90 x 0   Entered by:   Shonna Chock CMA   Authorized by:   Marga Melnick MD   Signed by:   Shonna Chock CMA on 05/15/2010   Method used:   Electronically to        [facility]* (retail)       [address]       [place], [place]  [address]       Ph: [phone]       Fax: [phone]   RxID:   [id number]

## 2010-09-25 ENCOUNTER — Ambulatory Visit (INDEPENDENT_AMBULATORY_CARE_PROVIDER_SITE_OTHER): Payer: Medicare Other | Admitting: Internal Medicine

## 2010-09-25 ENCOUNTER — Encounter: Payer: Self-pay | Admitting: Internal Medicine

## 2010-09-25 DIAGNOSIS — E785 Hyperlipidemia, unspecified: Secondary | ICD-10-CM

## 2010-09-25 DIAGNOSIS — I10 Essential (primary) hypertension: Secondary | ICD-10-CM

## 2010-10-04 NOTE — Assessment & Plan Note (Signed)
Summary: 3 MONTH FOLLOWUP///[facility]  Nurse Visit   Vital Signs:  Patient profile:   77 year old female Weight:      144.2 pounds Pulse rate:   72 / minute Resp:     14 per minute BP sitting:   124 / 72  (left arm) Cuff size:   large  Vitals Entered By: Shonna Chock CMA (September 25, 2010 10:23 AM)  Primary Care Provider:  Marga Melnick, MD  CC:  3 month follow-up (copy of labs given)  and Lipid Management.  History of Present Illness:  Hyperlipidemia Follow-Up      This is a 77 year old woman who presents for Hyperlipidemia follow-up. Labs reviewed & risks discussed. The patient denies muscle aches, GI upset, abdominal pain, flushing, itching, constipation, diarrhea, and fatigue.  Other symptoms include dypsnea.  The patient denies the following symptoms: chest pain/pressure, exercise intolerance, palpitations, syncope, and pedal edema.  Compliance with medications (by patient report) has been near 100%.  Dietary compliance has been good.  The patient reports exercising occasionally.  Adjunctive measures currently used by the patient include fiber & CoQ 10.    Lipid Management History:      Positive NCEP/ATP III risk factors include female age 29 years old or older, early menopause without estrogen hormone replacement, and hypertension.  Negative NCEP/ATP III risk factors include non-diabetic, no family history for ischemic heart disease, non-tobacco-user status, no ASHD (atherosclerotic heart disease), no prior stroke/TIA, no peripheral vascular disease, and no history of aortic aneurysm.      Past History:  Past Medical History: Asthma Hyperlipidemia: Framingham Study LDL goal = < 130. NMR Lipoprofile 2005: LDL 69( 1233/852), TG  154. LDL goal = <100. Hypertension Hypothyroidism elevated homocysteine level with high normal C reactive protein sleep apnea, borderline, CPAP intolerant IBS ; +CloTest 10/2008 radiculopathy Carpal tunnel syndrome Allergic rhinitis, Dr   Callas   Physical Exam  General:  Appears younger than age,well-nourished; alert,appropriate and cooperative throughout examination Heart:  Normal rate and regular rhythm. S1 and S2 normal without gallop, murmur, click, rub. S4 Pulses:  R and L carotid,radial,dorsalis pedis and posterior tibial pulses are full and equal bilaterally Extremities:  No clubbing, cyanosis, edema. Minor OA finger changes Psych:  memory intact for recent and remote, normally interactive, and good eye contact. Focused & motivated    Impression & Recommendations:  Problem # 1:  HYPERTENSION, ESSENTIAL NOS (ICD-401.9) controlled Her updated medication list for this problem includes:    Cardizem Cd 120 Mg Xr24h-cap (Diltiazem hcl coated beads) .Marland Kitchen... Take 1 tab once daily    Lisinopril 10 Mg Tabs (Lisinopril) .Marland Kitchen... 1 by mouth once daily  Problem # 2:  HYPERLIPIDEMIA (ICD-272.2) improved Her updated medication list for this problem includes:    Pravastatin Sodium 40 Mg Tabs (Pravastatin sodium) .Marland Kitchen... 1 at bedtime  Complete Medication List: 1)  Cardizem Cd 120 Mg Xr24h-cap (Diltiazem hcl coated beads) .... Take 1 tab once daily 2)  Xopenex 1.25 Mg/98ml Nebu (Levalbuterol hcl) .... Two times a day prn 3)  Pulmicort 0.5 Mg/61ml Susp (Budesonide) .... Use 1 ampule in nebulizer two times a day as needed 4)  Levothroid 25 Mcg Tabs (Levothyroxine sodium) .Marland Kitchen.. 1 qd 5)  Proair Hfa 108 (90 Base) Mcg/act Aers (Albuterol sulfate) .Marland Kitchen.. 1-2 pufss two times a day as needed 6)  Symbicort 160-4.5 Mcg/act Aero (Budesonide-formoterol fumarate) .Marland Kitchen.. 1-2 puffs two times a day 7)  Calcium 630mg   .... 1/2 by mouth two times a day 8)  Coq10 100 Mg Caps (Coenzyme q10) .Marland Kitchen.. 1 by mouth once daily 9)  Vitamin D 1000 Unit Tabs (Cholecalciferol) .Marland Kitchen.. 1 by mouth once daily 10)  Pravastatin Sodium 40 Mg Tabs (Pravastatin sodium) .Marland Kitchen.. 1 at bedtime 11)  Probiotic 10-50 Billion  .Marland Kitchen.. 1 by mouth once daily 12)  Flonase 50 Mcg/act Susp  (Fluticasone propionate) .... 2 sprays once daily 13)  Lisinopril 10 Mg Tabs (Lisinopril) .Marland Kitchen.. 1 by mouth once daily 14)  Levocetirizine Dihydrochloride 5 Mg Tabs (Levocetirizine dihydrochloride) .Marland Kitchen.. 1 by mouth at bedtime 15)  Vitamin B-12 1000 Mcg Tabs (Cyanocobalamin) .Marland Kitchen.. 1 by mouth once daily  Lipid Assessment/Plan:      Based on NCEP/ATP III, the patient's risk factor category is "2 or more risk factors and a calculated 10 year CAD risk of < 20%".  The patient's lipid goals are as follows: Total cholesterol goal is 200; LDL cholesterol goal is 130; HDL cholesterol goal is 40; Triglyceride goal is 150.  Her LDL cholesterol goal has been met.     Patient Instructions: 1)  Avoid High Fructose Corn Syrup sugar as discused. 2)  Check your Blood Pressure regularly. If it is above:  or as needed.  CC: 3 month follow-up (copy of labs given) , Lipid Management   Current Medications (verified): 1)  Cardizem Cd 120 Mg Xr24h-Cap (Diltiazem Hcl Coated Beads) .... Take 1 Tab Once Daily 2)  Xopenex 1.25 Mg/85ml Nebu (Levalbuterol Hcl) .... Two Times A Day Prn 3)  Pulmicort 0.5 Mg/35ml Susp (Budesonide) .... Use 1 Ampule in Nebulizer Two Times A Day As Needed 4)  Levothroid 25 Mcg Tabs (Levothyroxine Sodium) .Marland Kitchen.. 1 Qd 5)  Proair Hfa 108 (90 Base) Mcg/act Aers (Albuterol Sulfate) .Marland Kitchen.. 1-2 Pufss Two Times A Day As Needed 6)  Symbicort 160-4.5 Mcg/act Aero (Budesonide-Formoterol Fumarate) .Marland Kitchen.. 1-2 Puffs Two Times A Day 7)  Calcium 630mg  .... 1/2 By Mouth Two Times A Day 8)  Coq10 100 Mg Caps (Coenzyme Q10) .Marland Kitchen.. 1 By Mouth Once Daily 9)  Vitamin D 1000 Unit Tabs (Cholecalciferol) .Marland Kitchen.. 1 By Mouth Once Daily 10)  Pravastatin Sodium 40 Mg Tabs (Pravastatin Sodium) .Marland Kitchen.. 1 At Bedtime 11)  Probiotic 10-50 Billion .Marland Kitchen.. 1 By Mouth Once Daily 12)  Flonase 50 Mcg/act Susp (Fluticasone Propionate) .... 2 Sprays Once Daily 13)  Lisinopril 10 Mg Tabs (Lisinopril) .Marland Kitchen.. 1 By Mouth Once Daily 14)  Levocetirizine  Dihydrochloride 5 Mg Tabs (Levocetirizine Dihydrochloride) .Marland Kitchen.. 1 By Mouth At Bedtime 15)  Vitamin B-12 1000 Mcg Tabs (Cyanocobalamin) .Marland Kitchen.. 1 By Mouth Once Daily  Allergies: 1)  ! Lipitor 2)  ! Norvasc 3)  ! * Niaspan 4)  ! * Hctz  Orders Added: 1)  Est. Patient Level III [52841]

## 2010-10-05 ENCOUNTER — Encounter: Payer: Self-pay | Admitting: Internal Medicine

## 2010-10-30 NOTE — Consult Note (Signed)
Summary: [facility]  [facility]   Imported By: Maryln Gottron 10/23/2010 15:41:10  _____________________________________________________________________  External Attachment:    Type:   Image     Comment:   External Document

## 2010-12-18 ENCOUNTER — Other Ambulatory Visit: Payer: Self-pay | Admitting: Internal Medicine

## 2011-01-01 NOTE — Assessment & Plan Note (Signed)
[facility]                                 ON-CALL NOTE   JESSIECA, RHEM                       MRN:          [id number]  DATE:11/18/2008                            DOB:          12-12-1930    Mr. Ed Parkerson called regarding his wife, Valerie Decker who is a  patient of Dr. Norval Gable with IBS.  She is having severe diarrhea  today.  She was started on a Prevpac several days ago.  Recent  colonoscopy apparently was normal.   She was instructed to begin Imodium 2 tablets every 6 hours as needed,  provided that she does not get distended.  She was also instructed to  begin Florastor one tablet daily.     Barbette Hair. Arlyce Dice, MD,FACG  Electronically Signed    RDK/MedQ  DD: 11/18/2008  DT: 11/19/2008  Job #: [id number]   cc:   Vania Rea. Jarold Motto, MD, Caleen Essex, FAGA

## 2011-01-01 NOTE — Assessment & Plan Note (Signed)
[facility]                             PULMONARY OFFICE NOTE   YANNET, RINCON                     MRN:          [id number]  DATE:05/06/2007                            DOB:          1930-10-16    SUBJECTIVE:  Ms. Gilmore comes in today for followup after starting on  CPAP.  The patient has quite a few complaints about this and in fact has  not been using the CPAP.  She was most troubled by the headgear messing  up her hair but also feels a little bit of claustrophobia associated  with the mask.  In the interim the patient has started using wedge  pillows in order to stay on her side and has also used Breathe Right  strips and feels that this has helped her sleep.  She is a little better  rested, feels that she has improved sleep at night.   PHYSICAL EXAMINATION:  Blood pressure is 146/76, pulse 71, temperature  97.7, weight is 143 pounds, O2 saturation on room air is 97%.  There is  no evidence of skin breakdown or partial necrosis from the CPAP mask.   IMPRESSION:  Moderate obstructive sleep apnea.  The patient having great  difficulty with continuous positive airway pressure that primarily is  centered around inconveniences to her and her hairdo.  Patient is really  uncertain whether she wants her hair to look like it does each morning  whenever she arises.  She feels that she is doing better with the wedge  and Breathe Right strips and this may be reducing her apnea-hypopnea  index to some degree.  I have also re-discussed with her the  cardiovascular health benefits of treating her obstructive sleep apnea  as well.  I think at this point in time the patient really needs to  decide whether continuous positive airway pressure is something that she  can stick with long term.  I am more than willing to work with her with  respect to desensitization as well as optimizing pressure.  We have  really not done a lot to trouble shoot her device as I  am just seeing  her back after continuous positive airway pressure initiation.  The  patient is unsure whether she really wants to stick with the continuous  positive airway pressure or not.  Also discussed with her briefly about  the possibility of oral appliance.   PLAN:  The patient is going to think about whether she can stick with  CPAP long term and get with me within the next 1 week.  If she decides  to stick with CPAP, we can work on desensitization and other things.  If  she decides to discontinue the CPAP, she will either stick with her  wedge and Breathe Right strips versus considering oral appliance.     Barbaraann Share, MD,FCCP  Electronically Signed    [facility]/MedQ  DD: 05/07/2007  DT: 05/07/2007  Job #: [id number]   cc:   Titus Dubin. Alwyn Ren, MD,FACP,FCCP

## 2011-01-01 NOTE — Assessment & Plan Note (Signed)
[facility]                         GASTROENTEROLOGY OFFICE NOTE   NAME:MARTINAnica, Alcaraz                     MRN:          [id number]  DATE:09/18/2007                            DOB:          May 01, 1931    [name] is 100% better on a high-fiber diet with Citrucel.  She is  avoiding nonabsorbable carbohydrates and is using Lactaid  supplementation with good response.  She continues to have three soft  bowel movements each morning, despite taking daily Citrucel.  All of her  lab data was unremarkable, including celiac panel.   I have asked [name] to continue her current regimen, but have added  Pamine 2 mg after breakfast and twice a day as tolerated for her  diverticulosis and IBS.  I will see her on a p.r.n. basis in the future  as needed.  She will continue her medical followup with Dr. Alwyn Ren as  previously planned.  She is up to date on her colonoscopy and endoscopic  exams.     Vania Rea. Jarold Motto, MD, Caleen Essex, FAGA  Electronically Signed    DRP/MedQ  DD: 09/18/2007  DT: 09/18/2007  Job #: [id number]   cc:   Titus Dubin. Alwyn Ren, MD,FACP,FCCP

## 2011-01-01 NOTE — Assessment & Plan Note (Signed)
[facility]                             PULMONARY OFFICE NOTE   NAME:Valerie Decker, Valerie Decker                     MRN:          [id number]  DATE:03/31/2007                            DOB:          [birth year]    HISTORY OF PRESENT ILLNESS:  Patient is a 77 year old female whom I have  been asked to see for obstructive sleep apnea.  Patient has undergone  nocturnal polysomnography on December 09, 2006, where she was found to have  an apnea hypopnea index of 29 events per hour, and O2 desaturation as  low as 86%.  The patient typically goes to bed between 10 and midnight,  and gets up between 8 and 9 to start her day.  She is not rested upon  arising.  She has been noted to have snoring, as well as choking  arousals, and pauses in her breathing during sleep.  The patient is very  fatigued during the day, and has decreased alertness with periods of  inactivity.  She will often take a nap during the day, and may sleep for  2 to 3 hours.  She has definite sleepiness while driving.   PAST MEDICAL HISTORY:  1. Hypertension.  2. History of asthma.  3. History of dyslipidemia.  4. History of hypothyroidism, which is controlled on medications.   CURRENT MEDICATIONS:  Include:  1. Armour Thyroid 30 mcg 1 daily.  2. Vytorin 10/20 daily.  3. Diltiazem 120 daily.  4. Folic acid 1 mg daily.  5. Xopenex nebulizer 1.25 mg p.r.n.  6. Pulmicort 1 puff b.i.d.  7. Celebrex 100 mg daily.   PATIENT HAS NO KNOWN DRUG ALLERGIES.   SOCIAL HISTORY:  She has never smoked.  She is married and has children.   FAMILY HISTORY:  Noncontributory in 1st-degree relatives.   REVIEW OF SYSTEMS:  As per history of present illness.  Also, see  patient intake form documented in the chart.   PHYSICAL EXAMINATION:  GENERAL:  She is a well-developed female in no  acute distress.  Blood pressure is 128/82.  Pulse 87.  Temperature is 97.7.  She is 143  pounds, and is 5 feet 5 inches tall.  Her  O2 saturation on room air is  95%.  HEENT:  Pupils equal, round, and reactive to light and accommodation.  Extraocular muscles are intact.  Nares are narrowed, right greater than  left, but patent.  Oropharynx does show what appears to be a trimmed  uvula and palate.  NECK:  Supple without JVD or lymphadenopathy.  There is no palpable  thyromegaly.  CHEST:  Totally clear.  CARDIAC:  Exam reveals regular rate and rhythm.  No murmurs, rubs, or  gallops.  ABDOMEN:  Soft and nontender with good bowel sounds.  GENITAL EXAM:  Not done, and not indicated.  RECTAL EXAM:  Not done, and not indicated.  BREAST EXAM:  Not done, and not indicated.  LOWER EXTREMITIES:  Without edema.  Pulses are intact distally.  NEUROLOGIC:  Alert and oriented with no obvious motor deficits.   IMPRESSION:  Moderate obstructive sleep apnea with definite  poor sleep  efficiency and daytime sleepiness.  At this point in time, I really  think her best options are to consider either an oral appliance or  possibly CPAP.  She is agreeable to trying CPAP at this time.   PLAN:  1. We will initiate CPAP at 10 cm of water pressure.  She will      ultimately need pressure optimization.  2. Work on weight loss.  3. The patient will follow up in 4 weeks, or sooner if there are      problems.     Barbaraann Share, MD,FCCP  Electronically Signed    [facility]MC/MedQ  DD: 05/07/2007  DT: 05/07/2007  Job #: [id number]   cc:   Titus Dubin. Alwyn Ren, MD,FACP,FCCP

## 2011-01-01 NOTE — Assessment & Plan Note (Signed)
[facility]                         GASTROENTEROLOGY OFFICE NOTE   Valerie Decker, Valerie Decker                     MRN:          [id number]  DATE:09/03/2007                            DOB:          1930/10/09    HISTORY:  Valerie Decker is a 77 year old white female referred for  evaluation of abdominal gas, bloating, alternating diarrhea and  constipation.   Valerie Decker has a long history of IBS and diverticulosis E. coli.  Her last  colonoscopy was in March 2006.  It was a fairly unremarkable exam.  For  years, she has had alternating diarrhea and constipation, now mostly has  incomplete rectal emptying with several bowel movements in the morning,  gas and bloating, all made worse by recent increase in p.o. lactose  products.  She does also use a lot of breath mints that may be  contributing to her symptoms.  She has had no melena or hematochezia and  recently had negative Hemoccult cards.  She denies upper GI or  hepatobiliary complaints.  She does complain of a lot of abdominal gas,  but denies passage of foul-smelling flatus, anorexia or weight loss.  She has had no fever, chills or other systemic complaints.   PAST MEDICAL HISTORY:  1. Remarkable for hypertensive cardiovascular disease.  2. Hypercholesterolemia.  3. Chronic thyroid dysfunction.  4. Asthmatic bronchitis.  5. She has had previous hemorrhoidectomy.  6. Removal of benign breast lumps in the late 1950s.  7. She recently had a complete checkup by Dr. Alwyn Ren on July 31, 2007.  Other medical problems listed at that time were history of      carpal tunnel syndrome and sleep apnea.  At that time, liver      function tests were normal except for SGOT of 40.   MEDICATIONS:  1. Thyroid replacement 30 mg a day.  2. Diltiazem 120 mg a day.  3. Folic acid 1 mg a day.  4. Calcium 600 mg a day.  5. Xopenex nebulizer twice a day.  6. Pulmicort twice a day.  7. Celebrex 200 mg a day.  8.  Coenzyme daily.  9. Fish oil 1,000 mg a day.  10.Simvastatin 40 mg at bedtime.   DRUG ALLERGIES:  DENIES.   FAMILY HISTORY:  Remarkable for a mother who apparently died of  alcoholic cirrhosis.  There are no known gastrointestinal malignancies  in her family.   SOCIAL HISTORY:  She is on her second marriage and lives with her  husband.  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  She  does not smoke.  Uses ethanol in the form of wine with dinner, but  denies problems with ethanol dependency or abuse.   REVIEW OF SYSTEMS:  Remarkable for arthralgias in her hands, chronic  insomnia, low-grade peripheral edema, sciatica related to degenerative  disk disease of the spine, history of asthma, shortness of breath with  exertion.  Otherwise, review of systems noncontributory.   PHYSICAL EXAMINATION:  GENERAL:  She is a healthy-appearing attractive  white female in no distress.  I could not appreciate  stigmata of chronic  liver disease.  VITAL SIGNS:  She is 5 feet 5 inches, weighs 144 pounds, blood pressure  154/76, pulse 80 and regular.  CHEST:  Clear.  HEART:  She is in a regular rhythm without murmurs, rubs or gallops.  ABDOMEN:  I could not appreciate hepatosplenomegaly, abdominal masses or  tenderness.  Bowel sounds were normal.  EXTREMITIES:  Unremarkable.  MENTAL STATUS:  Clear.  RECTAL:  Deferred.   ASSESSMENT:  1. Irritable bowel syndrome with alternating diarrhea and      constipation, gas and bloating.  2. History of lactose intolerance and probable malabsorption of      nondigestible carbohydrates such as sorbitol and fructose.  3. Rule out celiac disease.  4. Hypertensive cardiovascular disease with hypercholesterolemia.  5. History of asthmatic bronchitis.  6. Chronic thyroid dysfunction on thyroid replacement therapy.  7. Status post hemorrhoidectomy.   RECOMMENDATIONS:  1. Trial of high-fiber diet with daily Citrucel and liberal p.o.      fluids.   2. Avoidance of sorbitol and fructose in her diet.  3. Lactaid use with major lactose products.  4. Check celiac panel and follow up liver function tests, although her      slight increase in liver enzyme elevation is probably from her      simvastatin.  She does have a vague family history of liver      disease, however, it may need further hepatic workup.  5. Continue other medications as per Dr. Alwyn Ren.    Vania Rea. Jarold Motto, MD, Caleen Essex, FAGA  Electronically Signed   DRP/MedQ  DD: 09/03/2007  DT: 09/03/2007  Job #: [id number]   cc:   Titus Dubin. Alwyn Ren, MD,FACP,FCCP

## 2011-01-01 NOTE — Assessment & Plan Note (Signed)
[facility]                        [place] OFFICE NOTE   NAME:[name], [name]                     MRN:          [id number]  DATE:12/24/2006                            DOB:          March 02, 1931    Valerie Decker was seen Dec 24, 2006 to evaluate sciatica.  She has  intermittent pain extending from the right posterior thigh into the calf  over the last 2 weeks.  She has been taking up to 3 ibuprofen at a time  with some relief, but unfortunately this caused some diarrhea.   She was seen in the [facility] on May 6 and given  hydrocodone and a prescription for steroids.  She declined to fill the  steroids because she had several courses in the recent past for her  reactive airway disease.   She denies fever, chills, sweats or weight loss.  She has had no  herpetic rash.  She has no urinary or stool incontinence.  There has  been no inguinal paresthesia.   On exam, weight is essentially stable at 147, pulse is 56, respiratory  rate 15 and blood pressure 138/78.  With tiptoe walking she limps on the  right; she is able to complete heel walking without difficulty.  Deep  tendon reflexes are equal.  She has negative straight leg raising  bilaterally.  Strength and range of motion is good.  She is able to sit  up on the table without help.   She does have some sciatica without evidence of ruptured disk.   Gabapentin 100 mg every 8 hours will be recommended, titrating the dose  up 3 every 8 hours if needed. Generic muscle relaxant at bedtime will be  Rxed PRN.  Because of the severity of the pain, physical therapy is  recommended and will be arranged.     Titus Dubin. Alwyn Ren, MD,FACP,FCCP  Electronically Signed    [facility]/MedQ  DD: 12/24/2006  DT: 12/24/2006  Job #: [id number]

## 2011-01-04 NOTE — Procedures (Signed)
Valerie Decker, Valerie Decker NO.:  [id number]   MEDICAL RECORD NO.:  [id number]          PATIENT TYPE:  OUT   LOCATION:  SLEEP CENTER                 FACILITY[facility]   PHYSICIAN:  Barbaraann Share, MD,FCCPDATE OF BIRTH:  November 29, 1930   DATE OF STUDY:  12/09/2006                            NOCTURNAL POLYSOMNOGRAM   REFERRING PHYSICIAN:   INDICATION FOR STUDY:  Hypersomnia with sleep apnea.   EPWORTH SLEEPINESS SCORE:  Seven.   SLEEP ARCHITECTURE:  The patient had total sleep time of 298 minutes  with adequate slow-wave sleep for age and decreased REM.  Sleep onset  latency was prolonged at 39 minutes, and REM onset was fairly rapid.  Sleep efficiency was very decreased at 65%.   RESPIRATORY DATA:  The patient was found to have 62 hypopneas and 93  apneas for an apnea/hypopnea index of 29 events per hour.  The events  were not positional, and there was light to moderate snoring noted  throughout.  The patient did not meet split-night protocol secondary to  the majority of her events occurring after 1 a.m.  This did not leave  enough time for adequate titration.   OXYGEN DATA:  The patient had O2 desaturation as low as 86% with her  obstructive events.   CARDIAC DATA:  No clinically significant cardiac arrhythmias were noted.   MOVEMENT-PARASOMNIA:  The patient was found to have 9 leg jerks with  very little sleep disruption.   IMPRESSION/RECOMMENDATION:  Moderate obstructive sleep apnea/hypopnea  syndrome with an apnea/hypopnea index of 29 events per hour and O2  desaturation as low as 86%.  Treatment for this degree of sleep apnea  should focus on weight loss alone if applicable, upper airway surgery,  or appliance, and also continuous positive airway pressure.  Clinical  correlation is suggested.      Barbaraann Share, MD,FCCP  Diplomate, American Board of Sleep  Medicine  Electronically Signed     KMC/MEDQ  D:  12/16/2006 16:20:16  T:  12/16/2006  18:47:54  Job:  [id number]

## 2011-01-10 ENCOUNTER — Other Ambulatory Visit: Payer: Self-pay | Admitting: Dermatology

## 2011-03-18 ENCOUNTER — Telehealth: Payer: Self-pay | Admitting: Internal Medicine

## 2011-03-18 MED ORDER — LEVOTHYROXINE SODIUM 25 MCG PO TABS: 25.0000 ug | ORAL_TABLET | Freq: Every day | ORAL | Status: DC

## 2011-03-18 NOTE — Telephone Encounter (Signed)
Sent in

## 2011-03-20 ENCOUNTER — Other Ambulatory Visit: Payer: Self-pay | Admitting: Internal Medicine

## 2011-04-09 ENCOUNTER — Encounter: Payer: Self-pay | Admitting: Internal Medicine

## 2011-04-09 ENCOUNTER — Ambulatory Visit (INDEPENDENT_AMBULATORY_CARE_PROVIDER_SITE_OTHER): Payer: Medicare Other | Admitting: Internal Medicine

## 2011-04-09 DIAGNOSIS — D179 Benign lipomatous neoplasm, unspecified: Secondary | ICD-10-CM

## 2011-04-09 DIAGNOSIS — E039 Hypothyroidism, unspecified: Secondary | ICD-10-CM

## 2011-04-09 DIAGNOSIS — F3289 Other specified depressive episodes: Secondary | ICD-10-CM

## 2011-04-09 DIAGNOSIS — IMO0002 Reserved for concepts with insufficient information to code with codable children: Secondary | ICD-10-CM

## 2011-04-09 DIAGNOSIS — R5383 Other fatigue: Secondary | ICD-10-CM

## 2011-04-09 DIAGNOSIS — R5381 Other malaise: Secondary | ICD-10-CM

## 2011-04-09 DIAGNOSIS — G4730 Sleep apnea, unspecified: Secondary | ICD-10-CM

## 2011-04-09 DIAGNOSIS — F329 Major depressive disorder, single episode, unspecified: Secondary | ICD-10-CM

## 2011-04-09 DIAGNOSIS — M792 Neuralgia and neuritis, unspecified: Secondary | ICD-10-CM

## 2011-04-09 LAB — CBC WITH DIFFERENTIAL/PLATELET
Basophils Absolute: 0.1 10*3/uL (ref 0.0–0.1)
Basophils Relative: 0.8 % (ref 0.0–3.0)
Eosinophils Absolute: 0.1 10*3/uL (ref 0.0–0.7)
Hemoglobin: 14.2 g/dL (ref 12.0–15.0)
Lymphocytes Relative: 32 % (ref 12.0–46.0)
MCHC: 33.5 g/dL (ref 30.0–36.0)
MCV: 95.7 fl (ref 78.0–100.0)
Monocytes Absolute: 0.4 10*3/uL (ref 0.1–1.0)
Neutro Abs: 4.6 10*3/uL (ref 1.4–7.7)
Neutrophils Relative %: 60.8 % (ref 43.0–77.0)
RBC: 4.44 Mil/uL (ref 3.87–5.11)
RDW: 14.4 % (ref 11.5–14.6)

## 2011-04-09 LAB — HEPATIC FUNCTION PANEL
Albumin: 4.2 g/dL (ref 3.5–5.2)
Total Bilirubin: 0.7 mg/dL (ref 0.3–1.2)

## 2011-04-09 LAB — TSH: TSH: 2.09 u[IU]/mL (ref 0.35–5.50)

## 2011-04-09 LAB — BASIC METABOLIC PANEL
BUN: 15 mg/dL (ref 6–23)
Creatinine, Ser: 0.8 mg/dL (ref 0.4–1.2)
GFR: 73.73 mL/min (ref >60.00)

## 2011-04-09 MED ORDER — CITALOPRAM HYDROBROMIDE 20 MG PO TABS: 20.0000 mg | ORAL_TABLET | Freq: Every day | ORAL | Status: DC

## 2011-04-09 NOTE — Patient Instructions (Signed)

## 2011-04-09 NOTE — Progress Notes (Signed)
  Subjective:    Patient ID: Valerie Decker, female    DOB: 1931-04-03, 77 y.o.   MRN: [id number]  HPI  #1 Depression: Onset:1 month ago w/o trigger; she is concerned about her son who lost his job. No marital issues Anxiety:yes Loss of interest (Anhedonia):yes Panic attacks:no but having crying spells Insomnia:variable Anorexia:no Fatigue:yes Neurologic signs/symptoms: no  numbness and tingling, weakness Endocrinologic signs and symptoms: no  weight change, vision change, bowel changes (on Probiotic), skin/hair,/nail changes Family history of mental health issues, alcoholism or drug abuse:mother alcoholism Medications/efficacy:no treatment  #2"LUMP"  Onset:2 nights ago L biceps area Trigger/injury:no Pain, redness swelling:no Constitutional: Fever, chills, sweats:no Heme: Abnormal bruising or clotting, lymphadenopathy:no Treatment/response:none           Review of Systems she has had intolerance to  cold. During this period time she has had intermittent sharp headaches which last seconds; these have not been treated.     Objective:   Physical Exam  Gen.:  well-nourished; in no acute distress Eyes: Extraocular motion intact; no lid lag or proptosis Neck:  No deformities, thyromegaly, masses, or tenderness noted.   Supple with full range of motion without pain.  Heart: Normal rhythm and rate without significant murmur, gallop, or extra heart sounds Lungs: Chest clear to auscultation without rales,rales, wheezes Neuro:Deep tendon reflexes are equal and within normal limits; no tremor  Skin: Warm and dry without significant lesions or rashes; no onycholysis. She has a pea-sized lipoma over the left biceps which transilluminates Abdomen: bowel sounds normal, soft and non-tender without masses, organomegaly or hernias noted.  No guarding or rebound . Aorta palpable w/o AAA or bruits Lymphatic: No lymphadenopathy is noted about the head, neck, axilla Psych: Normally communicative  and interactive; slightly tearful         Assessment & Plan:  #1 lipoma left biceps area; no organomegaly or lymphadenopathy present  #2 fatigue   #3 depression without specific trigger  #4 headache, neuralgia type.  Plan: See orders and recommendations. Pathophysiology of serotonin deficiency discussed.

## 2011-05-17 ENCOUNTER — Other Ambulatory Visit: Payer: Self-pay | Admitting: Internal Medicine

## 2011-06-17 ENCOUNTER — Encounter: Payer: Medicare Other | Admitting: Internal Medicine

## 2011-06-18 ENCOUNTER — Other Ambulatory Visit: Payer: Self-pay | Admitting: Internal Medicine

## 2011-06-18 ENCOUNTER — Ambulatory Visit (INDEPENDENT_AMBULATORY_CARE_PROVIDER_SITE_OTHER): Payer: Medicare Other | Admitting: Internal Medicine

## 2011-06-18 ENCOUNTER — Encounter: Payer: Self-pay | Admitting: Internal Medicine

## 2011-06-18 ENCOUNTER — Telehealth: Payer: Self-pay

## 2011-06-18 DIAGNOSIS — R040 Epistaxis: Secondary | ICD-10-CM

## 2011-06-18 DIAGNOSIS — I10 Essential (primary) hypertension: Secondary | ICD-10-CM

## 2011-06-18 LAB — CBC WITH DIFFERENTIAL/PLATELET
Basophils Relative: 0.6 % (ref 0.0–3.0)
Eosinophils Relative: 0.2 % (ref 0.0–5.0)
HCT: 42.2 % (ref 36.0–46.0)
Hemoglobin: 14 g/dL (ref 12.0–15.0)
Lymphs Abs: 2 10*3/uL (ref 0.7–4.0)
MCV: 96.8 fl (ref 78.0–100.0)
Monocytes Relative: 4.4 % (ref 3.0–12.0)
Neutro Abs: 5.1 10*3/uL (ref 1.4–7.7)
RBC: 4.36 Mil/uL (ref 3.87–5.11)
WBC: 7.6 10*3/uL (ref 4.5–10.5)

## 2011-06-18 MED ORDER — LISINOPRIL 10 MG PO TABS: 10.0000 mg | ORAL_TABLET | Freq: Every day | ORAL | Status: DC

## 2011-06-18 MED ORDER — LEVOTHYROXINE SODIUM 25 MCG PO TABS: 25.0000 ug | ORAL_TABLET | Freq: Every day | ORAL | Status: DC

## 2011-06-18 NOTE — Progress Notes (Signed)
Addended by: Marga Melnick F on: 06/18/2011 11:00 AM   Modules accepted: Orders

## 2011-06-18 NOTE — Telephone Encounter (Signed)
Tried to contact Pt several times line is busy will try again later.

## 2011-06-18 NOTE — Progress Notes (Signed)
Addended byMarga Melnick F on: 06/18/2011 05:15 PM   Modules accepted: Orders

## 2011-06-18 NOTE — Telephone Encounter (Signed)
I put in a referral for ENT evaluation. If the nosebleeds persists tonight, go to the [facility]  urgent care on [facility].

## 2011-06-18 NOTE — Telephone Encounter (Signed)
RXs sent.

## 2011-06-18 NOTE — Patient Instructions (Signed)
Use Eucerin or another non perfumed  moisturizing agent  twice a day  for the drying. Blood Pressure Goal  Ideally is an AVERAGE < 135/85. This AVERAGE should be calculated from @ least 5-7 BP readings taken @ different times of day on different days of week. You should not respond to isolated BP readings , but rather the AVERAGE for that week  See your Ophthalmologist

## 2011-06-18 NOTE — Progress Notes (Signed)
  Subjective:    Patient ID: Valerie Decker, female    DOB: 20-Jul-1931, 77 y.o.   MRN: [id number]  HPI EPISTAXIS in context of CHRONIC HYPERTENSION: Epistaxis woke her this am  & lasted 15 min. No trigger or injury.EMT checked her & called for F/U.  Disease Monitoring  Blood pressure : 178/76 as per EMT. BP @ drug store 130s/80s typically  Chest pain: no,    Dyspnea: no   Claudication: no but am cramping  Medication compliance: yes, but ACE-I missed 10/28 due to nausea &  Loose stool with frequency Medication Side Effects  Lightheadedness: no   Urinary frequency: no   Edema: no    Preventitive Healthcare:  Exercise: no   Diet Pattern: no plan  Salt Restriction: yes      Review of Systems she denies any personal or family history or bleeding or clotting dyscrasias. Specifically she has not had hemoptysis, hematemesis, melena, rectal bleeding, or abnormal bruising or bleeding. She denies any difficulty with wound healing.     Objective:   Physical Exam Gen.: Healthy and well-nourished in appearance. Alert, appropriate and cooperative throughout exam. Eyes: No corneal or conjunctival inflammation noted. Pupils equal round but small.  Nose: External nasal exam reveals no deformity or inflammation. Nasal mucosa are dry w/o bleeding. No lesions or exudates noted.  Mouth: Oral mucosa and oropharynx reveal no lesions or exudates. Teeth in good repair. S/P uvulectomyLungs: Normal respiratory effort; chest expands symmetrically. Lungs are clear to auscultation without rales, wheezes, or increased work of breathing. Heart: Normal rate and rhythm. Normal S1 and S2. No gallop, click, or rub. S 4 with slurring murmur. Abdomen: Bowel sounds normal; abdomen soft and nontender. No masses, organomegaly or hernias noted. Aorta palpable w/o AAA                                                                                    Musculoskeletal/extremities:  No clubbing, cyanosis, edema, or significant  deformity noted.  Nail health  good. Vascular: Carotid, radial artery, dorsalis pedis and  posterior tibial pulses are full and equal. No bruits present. Neurologic: Alert and oriented x3.  Skin: Intact without suspicious lesions or rashes. Lymph: No cervical, axillary  lymphadenopathy present. Psych: Mood and affect are normal. Normally interactive                                                                                            Assessment & Plan:  #1 epistaxis, self-limited. No histologic or clinical evidence of bleeding or clotting dyscrasias  #2 hypertension, controlled by history  #3 pinpoint pupils; ophthalmologic followup indicated  Plan: see orders and recommendations.

## 2011-06-18 NOTE — Telephone Encounter (Signed)
Pt was seen in office today. Pt's husband left message of c/o pt having another nose bleed when they got home today and feeling bad. They would like a call back today to know what to do. Please Advise

## 2011-06-18 NOTE — Telephone Encounter (Signed)
Discuss with patient husband

## 2011-08-19 ENCOUNTER — Emergency Department (HOSPITAL_COMMUNITY)
Admission: EM | Admit: 2011-08-19 | Discharge: 2011-08-19 | Disposition: A | Discharge status: Home / Self Care | Payer: Medicare Other | Attending: Emergency Medicine | Admitting: Emergency Medicine

## 2011-08-19 ENCOUNTER — Encounter (HOSPITAL_COMMUNITY): Payer: Self-pay | Admitting: *Deleted

## 2011-08-19 ENCOUNTER — Other Ambulatory Visit: Payer: Self-pay

## 2011-08-19 DIAGNOSIS — R197 Diarrhea, unspecified: Secondary | ICD-10-CM | POA: Insufficient documentation

## 2011-08-19 DIAGNOSIS — J45909 Unspecified asthma, uncomplicated: Secondary | ICD-10-CM | POA: Insufficient documentation

## 2011-08-19 DIAGNOSIS — R42 Dizziness and giddiness: Secondary | ICD-10-CM | POA: Insufficient documentation

## 2011-08-19 DIAGNOSIS — K589 Irritable bowel syndrome without diarrhea: Secondary | ICD-10-CM | POA: Insufficient documentation

## 2011-08-19 DIAGNOSIS — R110 Nausea: Secondary | ICD-10-CM | POA: Insufficient documentation

## 2011-08-19 DIAGNOSIS — E039 Hypothyroidism, unspecified: Secondary | ICD-10-CM | POA: Insufficient documentation

## 2011-08-19 DIAGNOSIS — I10 Essential (primary) hypertension: Secondary | ICD-10-CM | POA: Insufficient documentation

## 2011-08-19 DIAGNOSIS — K219 Gastro-esophageal reflux disease without esophagitis: Secondary | ICD-10-CM | POA: Insufficient documentation

## 2011-08-19 DIAGNOSIS — E782 Mixed hyperlipidemia: Secondary | ICD-10-CM | POA: Insufficient documentation

## 2011-08-19 DIAGNOSIS — R002 Palpitations: Secondary | ICD-10-CM | POA: Insufficient documentation

## 2011-08-19 LAB — COMPREHENSIVE METABOLIC PANEL
ALT: 15 U/L (ref 0–35)
Alkaline Phosphatase: 67 U/L (ref 39–117)
BUN: 10 mg/dL (ref 6–23)
CO2: 25 mEq/L (ref 19–32)
GFR calc Af Amer: >90 mL/min (ref >90)
GFR calc non Af Amer: 81 mL/min — ABNORMAL LOW (ref >90)
Glucose, Bld: 108 mg/dL — ABNORMAL HIGH (ref 70–99)
Potassium: 3.9 mEq/L (ref 3.5–5.1)
Total Bilirubin: 0.3 mg/dL (ref 0.3–1.2)
Total Protein: 7.4 g/dL (ref 6.0–8.3)

## 2011-08-19 LAB — CBC
Hemoglobin: 14 g/dL (ref 12.0–15.0)
MCH: 31 pg (ref 26.0–34.0)
MCV: 92.7 fL (ref 78.0–100.0)
RBC: 4.51 MIL/uL (ref 3.87–5.11)
WBC: 8.5 10*3/uL (ref 4.0–10.5)

## 2011-08-19 LAB — URINALYSIS, ROUTINE W REFLEX MICROSCOPIC
Ketones, ur: NEGATIVE mg/dL
Nitrite: NEGATIVE
Protein, ur: NEGATIVE mg/dL
Urobilinogen, UA: 0.2 mg/dL (ref 0.0–1.0)
pH: 5.5 (ref 5.0–8.0)

## 2011-08-19 LAB — DIFFERENTIAL
Eosinophils Absolute: 0 10*3/uL (ref 0.0–0.7)
Lymphocytes Relative: 25 % (ref 12–46)
Lymphs Abs: 2.2 10*3/uL (ref 0.7–4.0)
Monocytes Relative: 6 % (ref 3–12)
Neutrophils Relative %: 68 % (ref 43–77)

## 2011-08-19 LAB — URINE MICROSCOPIC-ADD ON

## 2011-08-19 MED ORDER — ONDANSETRON HCL 4 MG/2ML IJ SOLN
4.0000 mg | Freq: Once | INTRAMUSCULAR | Status: AC
  Administered 2011-08-19: 4 mg via INTRAVENOUS
  Filled 2011-08-19: qty 2

## 2011-08-19 MED ORDER — SODIUM CHLORIDE 0.9 % IV BOLUS (SEPSIS)
1000.0000 mL | Freq: Once | INTRAVENOUS | Status: AC
  Administered 2011-08-19: 1000 mL via INTRAVENOUS

## 2011-08-19 NOTE — ED Notes (Signed)
Pt multiple complaints. HTN, intermittent all over body burning sensation, and a unknown ingested substance after washing and a ice scream scooper in the dishwasher

## 2011-08-19 NOTE — ED Provider Notes (Signed)
History     CSN: [id number]  Arrival date & time 08/19/11  4098   First MD Initiated Contact with Patient 08/19/11 [phone]      Chief Complaint  Patient presents with  . Hypertension    (Consider location/radiation/quality/duration/timing/severity/associated sxs/prior treatment) HPI Comments: Patient reports lightheadedness, palpitations, abdominal discomfort, nausea, diarrhea, and "hot flashes" x 4 days.  States the symptoms began 4 days ago with lightheadedness whenever she stood up.  She has also had intermittent palpitations, lasting 20-30 seconds at a time, described as "fluttering."  States her abdomen has been "queasy" without pain and has had multiple loose stools every day.  Notes she has not been able to urinate very much today, which is unusual for her.  Pt is concerned because she ate ice cream 4 days ago off of a scoop that is not dishwasher safe but had been put through the dishwasher and formed a black film over it.    Patient is a 77 y.o. female presenting with hypertension. The history is provided by the patient.  Hypertension Pertinent negatives include no chest pain, coughing, fever or vomiting.    Past Medical History  Diagnosis Date  . Disorder of bone and cartilage, unspecified   . Allergic rhinitis, cause unspecified   . Esophageal reflux   . Irritable bowel syndrome   . Mixed hyperlipidemia   . Unspecified sleep apnea   . Unspecified essential hypertension   . Unspecified hypothyroidism   . Neuralgia, neuritis, and radiculitis, unspecified   . Carpal tunnel syndrome   . Unspecified asthma     History reviewed. No pertinent past surgical history.  History reviewed. No pertinent family history.  History  Substance Use Topics  . Smoking status: Never Smoker   . Smokeless tobacco: Not on file  . Alcohol Use: Yes     Wine    OB History    Grav Para Term Preterm Abortions TAB SAB Ect Mult Living                  Review of Systems    Constitutional: Negative for fever, activity change and appetite change.  Respiratory: Negative for cough and shortness of breath.   Cardiovascular: Negative for chest pain.  Gastrointestinal: Negative for vomiting.  Genitourinary: Negative for dysuria and frequency.  All other systems reviewed and are negative.    Allergies  Amlodipine besylate; Atorvastatin; and Niacin  Home Medications   Current Outpatient Rx  Name Route Sig Dispense Refill  . SYMBICORT IN Inhalation Inhale 2 puffs into the lungs 2 (two) times daily. Symbicort-strength unknown     . CALCIUM 600 PO Oral Take by mouth. 1/2 by mouth two times daily      . CARDIZEM CD 120 MG PO CP24  TAKE 1 CAPSULE EVERY DAY. 90 each 1  . VITAMIN D 1000 UNITS PO TABS Oral Take 1,000 Units by mouth daily.      Marland Kitchen CITALOPRAM HYDROBROMIDE 20 MG PO TABS Oral Take 20 mg by mouth daily as needed. For depression     . COQ10 100 MG PO CAPS Oral Take by mouth daily.      . B-12 1000 MCG PO CAPS Oral Take by mouth daily. Chewable    . FLUTICASONE PROPIONATE 50 MCG/ACT NA SUSP Nasal Place 2 sprays into the nose 2 (two) times daily.     Marland Kitchen LEVALBUTEROL HCL 1.25 MG/3ML IN NEBU Nebulization Take 1 ampule by nebulization 2 (two) times daily.      Marland Kitchen  LEVOCETIRIZINE DIHYDROCHLORIDE 5 MG PO TABS Oral Take 5 mg by mouth every evening.      Marland Kitchen LEVOTHYROXINE SODIUM 25 MCG PO TABS Oral Take 1 tablet (25 mcg total) by mouth daily. 90 tablet 2    Due for fasting labs and Cpx for further refills  . LISINOPRIL 10 MG PO TABS Oral Take 1 tablet (10 mg total) by mouth daily. 90 tablet 2  . PRAVASTATIN SODIUM 40 MG PO TABS  TAKE  ONE TABLET BY MOUTH NIGHTLY AT BEDTIME 90 tablet 2  . PROBIOTIC PO Oral Take by mouth daily.        BP 169/84  Pulse 75  Temp(Src) 98.4 F (36.9 C) (Oral)  Resp 18  SpO2 98%  Physical Exam  Nursing note and vitals reviewed. Constitutional: She is oriented to person, place, and time. She appears well-developed and well-nourished.   HENT:  Head: Normocephalic and atraumatic.  Neck: Neck supple.  Cardiovascular: Normal rate, regular rhythm, normal heart sounds and normal pulses.   Pulmonary/Chest: Breath sounds normal. No respiratory distress. She has no wheezes. She has no rales. She exhibits no tenderness.  Abdominal: Soft. Bowel sounds are normal. She exhibits no distension and no mass. There is generalized tenderness. There is no rebound and no guarding.  Musculoskeletal: She exhibits no edema and no tenderness.  Neurological: She is alert and oriented to person, place, and time.  Psychiatric: She has a normal mood and affect.    ED Course  Procedures (including critical care time)  8:00 AM Per tech, Thayer Ohm, patient gave urine sample by clean catch.  However, in and out cath was accidentally marked as completed in the system.    Pt also seen by Dr Denton Lank.   Labs Reviewed  COMPREHENSIVE METABOLIC PANEL - Abnormal; Notable for the following:    Glucose, Bld 108 (*)    GFR calc non Af Amer 81 (*)    All other components within normal limits  URINALYSIS, ROUTINE W REFLEX MICROSCOPIC - Abnormal; Notable for the following:    Bilirubin Urine SMALL (*)    Leukocytes, UA SMALL (*)    All other components within normal limits  URINE MICROSCOPIC-ADD ON - Abnormal; Notable for the following:    Casts HYALINE CASTS (*)    All other components within normal limits  CBC  DIFFERENTIAL  TROPONIN I  URINE CULTURE     Date: 08/19/2011  Rate: 67  Rhythm: normal sinus rhythm  QRS Axis: normal  Intervals: normal  ST/T Wave abnormalities: normal  Conduction Disutrbances:none  Narrative Interpretation:   Old EKG Reviewed: none available   No results found.   1. Lightheaded   2. Diarrhea       MDM  Healthy appearing 77 year old with diarrhea, lightheadedness, occasional palpitations.  Workup unremarkable.  Urine sent for culture.  Discussed all results and plan with patient.  Patient d/c home with PCP follow  up.          Rise Patience, [place] 08/19/11 1049

## 2011-08-19 NOTE — ED Notes (Signed)
Pt c/o HTN, and a intermittent burning sensation all over her body. Pt also concerned about unknown (Black stuff) ingested substance from a ice cream scooper.

## 2011-08-21 ENCOUNTER — Encounter: Payer: Self-pay | Admitting: Internal Medicine

## 2011-08-21 ENCOUNTER — Ambulatory Visit (INDEPENDENT_AMBULATORY_CARE_PROVIDER_SITE_OTHER): Payer: Medicare Other | Admitting: Internal Medicine

## 2011-08-21 DIAGNOSIS — Z0000 Encounter for general adult medical examination without abnormal findings: Secondary | ICD-10-CM

## 2011-08-21 DIAGNOSIS — M8580 Other specified disorders of bone density and structure, unspecified site: Secondary | ICD-10-CM

## 2011-08-21 DIAGNOSIS — M899 Disorder of bone, unspecified: Secondary | ICD-10-CM

## 2011-08-21 NOTE — Progress Notes (Signed)
Subjective:    Patient ID: Valerie Decker, female    DOB: 06-12-1931, 78 y.o.   MRN: [id number]  HPI Medicare Wellness Visit:  The following psychosocial & medical history were reviewed as required by Medicare.   Social history: caffeine: decaffeinated coffee , alcohol:  Glass of wine with meals ,  tobacco use : never  & exercise : no.   Home & personal  safety / fall risk: no issues, activities of daily living: no limitations  , seatbelt use : yes , and smoke alarm employment : yes .  Power of Attorney/Living Will status : in place  Vision ( as recorded per Nurse) & Hearing  evaluation :  Ophth seen 1 month ago; no issues. Decreased hearing to whisper @ 6 ft. Orientation :oriented x3 , memory & recall :good, spelling  testing: excellent ,and mood & affect : normal . Depression / anxiety: no issues Travel history : 45 [place] , immunization status :Shingles needed , transfusion history:  no, and preventive health surveillance ( colonoscopies, BMD , etc as per protocol/ SOC): up to date, Dental care:  Seen every 6 mos . Chart reviewed &  Updated. Active issues reviewed & addressed.       Review of Systems Emergency room records 08/19/2011 were reviewed. She was seen with a constellation of symptoms attributed to possible  "viral infection". The symptoms included nausea, loose to watery stool, dizziness, and marked hypertension with systolic blood pressure up to 409. She received IV fluids; no new medications were prescribed. Her BUN was actually 10 and creatinine 0.7. GFR was minimally reduced at 81. Glucose was minimally elevated at 108. All other labs were normal. EKG revealed small Q waves inferiorly and laterally with no ischemic ST-T wave changes.     Objective:   Physical Exam Gen.: Healthy and well-nourished in appearance. Alert, appropriate and cooperative throughout exam.Appears younger than stated age  Head: Normocephalic without obvious abnormalities Eyes: No corneal or  conjunctival inflammation noted. Pupils equal round reactive to light and accommodation.  Extraocular motion intact w/o nystagmus. Ears: External  ear exam reveals no significant lesions or deformities. Canals :some wax blaterally.  Nose: External nasal exam reveals no deformity or inflammation. Nasal mucosa are pink and moist. No lesions or exudates noted. Mouth: Oral mucosa and oropharynx reveal no lesions or exudates. Teeth in good repair. Neck: No deformities, masses, or tenderness noted. Range of motion &. Thyroid normal. Lungs: Normal respiratory effort; chest expands symmetrically. Lungs are clear to auscultation without rales, wheezes, or increased work of breathing. Heart: Normal rate and rhythm. Normal S1 and S2. No gallop, click, or rub. No murmur. Abdomen: Bowel sounds normal; abdomen soft and nontender. No masses, organomegaly or hernias noted.Aorta palpable ; no AAA  Genitalia: Dr Arlyce Dice  .                                                                                   Musculoskeletal/extremities: No deformity or scoliosis noted of  the thoracic or lumbar spine. No clubbing, cyanosis, edema, or deformity noted. Range of motion  normal .Tone & strength  normal.Joints normal. Nail health  good. Vascular: Carotid, radial artery, dorsalis pedis  and  posterior tibial pulses are full and equal. No bruits present. Neurologic: Alert and oriented x3. Deep tendon reflexes symmetrical and normal.          Skin: Intact without suspicious lesions or rashes. Lymph: No cervical, axillary lymphadenopathy present. Psych: Mood and affect are normal. Normally interactive                                                                                         Assessment & Plan:  #1 Medicare Wellness Exam; criteria met ; data entered  #2 probable viral gastroenteritis, essentially resolved. No significant abnormalities on labs as noted #3 Problem List reviewed ; Assessment/ Recommendations  made Plan: see Orders

## 2011-08-21 NOTE — ED Provider Notes (Signed)
Medical screening examination/treatment/procedure(s) were conducted as a shared visit with non-physician practitioner(s) and myself.  I personally evaluated the patient during the encounter   Suzi Roots, MD 08/21/11 [phone]

## 2011-08-21 NOTE — Patient Instructions (Signed)
Preventive Health Care: Exercise  30-45  minutes a day, 3-4 days a week. Walking is especially valuable in preventing Osteoporosis. Eat a low-fat diet with lots of fruits and vegetables, up to 7-9 servings per day. Consume less than 30 grams of sugar per day from foods & drinks with High Fructose Corn Syrup as # 1,2,3 or #4 on label. Please consider Audiology evaluation to assess the hearing deficit suggested on exam.

## 2011-08-22 LAB — VITAMIN D 25 HYDROXY (VIT D DEFICIENCY, FRACTURES): Vit D, 25-Hydroxy: 71 ng/mL (ref 30–89)

## 2011-09-17 ENCOUNTER — Other Ambulatory Visit: Payer: Self-pay | Admitting: Internal Medicine

## 2011-09-17 NOTE — Telephone Encounter (Signed)
Lipid/Hep 272.4/995.20  

## 2011-10-08 ENCOUNTER — Encounter: Payer: Self-pay | Admitting: Internal Medicine

## 2011-11-18 ENCOUNTER — Other Ambulatory Visit: Payer: Self-pay | Admitting: Internal Medicine

## 2011-12-18 ENCOUNTER — Other Ambulatory Visit: Payer: Self-pay | Admitting: Allergy

## 2011-12-18 ENCOUNTER — Other Ambulatory Visit: Payer: Self-pay | Admitting: Internal Medicine

## 2011-12-18 ENCOUNTER — Ambulatory Visit
Admission: RE | Admit: 2011-12-18 | Discharge: 2011-12-18 | Disposition: A | Discharge status: Home / Self Care | Payer: Medicare Other | Source: Ambulatory Visit | Attending: Allergy | Admitting: Allergy

## 2011-12-18 DIAGNOSIS — J45909 Unspecified asthma, uncomplicated: Secondary | ICD-10-CM

## 2011-12-18 NOTE — Telephone Encounter (Signed)
Refill done.  

## 2012-01-04 ENCOUNTER — Emergency Department (HOSPITAL_COMMUNITY)
Admission: EM | Admit: 2012-01-04 | Discharge: 2012-01-04 | Disposition: A | Discharge status: Home / Self Care | Payer: Medicare Other | Attending: Emergency Medicine | Admitting: Emergency Medicine

## 2012-01-04 ENCOUNTER — Emergency Department (HOSPITAL_COMMUNITY): Payer: Medicare Other

## 2012-01-04 ENCOUNTER — Encounter (HOSPITAL_COMMUNITY): Payer: Self-pay | Admitting: Neurology

## 2012-01-04 ENCOUNTER — Emergency Department (INDEPENDENT_AMBULATORY_CARE_PROVIDER_SITE_OTHER)
Admission: EM | Admit: 2012-01-04 | Discharge: 2012-01-04 | Disposition: A | Discharge status: Nursing Home (Includes ICF) | Payer: Medicare Other | Source: Home / Self Care | Attending: Emergency Medicine | Admitting: Emergency Medicine

## 2012-01-04 ENCOUNTER — Encounter (HOSPITAL_COMMUNITY): Payer: Self-pay

## 2012-01-04 DIAGNOSIS — E039 Hypothyroidism, unspecified: Secondary | ICD-10-CM | POA: Insufficient documentation

## 2012-01-04 DIAGNOSIS — K589 Irritable bowel syndrome without diarrhea: Secondary | ICD-10-CM | POA: Insufficient documentation

## 2012-01-04 DIAGNOSIS — W07XXXA Fall from chair, initial encounter: Secondary | ICD-10-CM | POA: Insufficient documentation

## 2012-01-04 DIAGNOSIS — R110 Nausea: Secondary | ICD-10-CM | POA: Insufficient documentation

## 2012-01-04 DIAGNOSIS — E782 Mixed hyperlipidemia: Secondary | ICD-10-CM | POA: Insufficient documentation

## 2012-01-04 DIAGNOSIS — Z79899 Other long term (current) drug therapy: Secondary | ICD-10-CM | POA: Insufficient documentation

## 2012-01-04 DIAGNOSIS — S060X0A Concussion without loss of consciousness, initial encounter: Secondary | ICD-10-CM | POA: Insufficient documentation

## 2012-01-04 DIAGNOSIS — I10 Essential (primary) hypertension: Secondary | ICD-10-CM | POA: Insufficient documentation

## 2012-01-04 DIAGNOSIS — R413 Other amnesia: Secondary | ICD-10-CM

## 2012-01-04 DIAGNOSIS — R51 Headache: Secondary | ICD-10-CM | POA: Insufficient documentation

## 2012-01-04 DIAGNOSIS — F29 Unspecified psychosis not due to a substance or known physiological condition: Secondary | ICD-10-CM | POA: Insufficient documentation

## 2012-01-04 DIAGNOSIS — R55 Syncope and collapse: Secondary | ICD-10-CM

## 2012-01-04 DIAGNOSIS — K219 Gastro-esophageal reflux disease without esophagitis: Secondary | ICD-10-CM | POA: Insufficient documentation

## 2012-01-04 DIAGNOSIS — J45909 Unspecified asthma, uncomplicated: Secondary | ICD-10-CM | POA: Insufficient documentation

## 2012-01-04 DIAGNOSIS — W19XXXA Unspecified fall, initial encounter: Secondary | ICD-10-CM

## 2012-01-04 LAB — DIFFERENTIAL
Basophils Absolute: 0 10*3/uL (ref 0.0–0.1)
Eosinophils Absolute: 0.1 10*3/uL (ref 0.0–0.7)
Eosinophils Relative: 1 % (ref 0–5)
Lymphocytes Relative: 17 % (ref 12–46)
Lymphs Abs: 1.8 10*3/uL (ref 0.7–4.0)
Neutrophils Relative %: 76 % (ref 43–77)

## 2012-01-04 LAB — BASIC METABOLIC PANEL
CO2: 26 mEq/L (ref 19–32)
Calcium: 9.2 mg/dL (ref 8.4–10.5)
GFR calc non Af Amer: 61 mL/min — ABNORMAL LOW (ref >90)
Glucose, Bld: 108 mg/dL — ABNORMAL HIGH (ref 70–99)
Potassium: 3.6 mEq/L (ref 3.5–5.1)
Sodium: 133 mEq/L — ABNORMAL LOW (ref 135–145)

## 2012-01-04 LAB — CBC
MCV: 92.3 fL (ref 78.0–100.0)
Platelets: 204 10*3/uL (ref 150–400)
RBC: 4.39 MIL/uL (ref 3.87–5.11)
RDW: 14.7 % (ref 11.5–15.5)
WBC: 10.6 10*3/uL — ABNORMAL HIGH (ref 4.0–10.5)

## 2012-01-04 MED ORDER — OXYCODONE-ACETAMINOPHEN 5-325 MG PO TABS
1.0000 | ORAL_TABLET | Freq: Once | ORAL | Status: AC
  Administered 2012-01-04: 1 via ORAL
  Filled 2012-01-04: qty 1

## 2012-01-04 MED ORDER — ONDANSETRON HCL 4 MG/2ML IJ SOLN
4.0000 mg | Freq: Once | INTRAMUSCULAR | Status: AC
  Administered 2012-01-04: 4 mg via INTRAVENOUS
  Filled 2012-01-04: qty 2

## 2012-01-04 MED ORDER — ONDANSETRON HCL 4 MG/2ML IJ SOLN
4.0000 mg | Freq: Once | INTRAMUSCULAR | Status: AC
  Administered 2012-01-04: 4 mg via INTRAVENOUS

## 2012-01-04 MED ORDER — SODIUM CHLORIDE 0.9 % IV SOLN: Freq: Once | INTRAVENOUS | Status: DC

## 2012-01-04 MED ORDER — OXYCODONE-ACETAMINOPHEN 5-325 MG PO TABS: 1.0000 | ORAL_TABLET | ORAL | Status: AC | PRN

## 2012-01-04 MED ORDER — ONDANSETRON HCL 4 MG/2ML IJ SOLN
INTRAMUSCULAR | Status: AC
  Filled 2012-01-04: qty 2

## 2012-01-04 NOTE — ED Notes (Signed)
Pt a x 4. C/o headache 4/10. Unsure details around fall, doesn't remember what happened after fall. Remembers husband driving to [facility]. No injuries noted, skin intact. No neuro deficits.

## 2012-01-04 NOTE — Discharge Instructions (Signed)
Your CAT scan showed a small nodule in the right lung. You should have a CAT scan of your chest repeated in one year to make sure that the nodule is not growing. Dr. Alwyn Ren in order that for you.  Concussion and Brain Injury A blow or jolt to the head can disrupt the normal function of the brain. This type of brain injury is often called a "concussion" or a "closed head injury." Concussions are usually not life-threatening. Even so, the effects of a concussion can be serious.  CAUSES  A concussion is caused by a blunt blow to the head. The blow might be direct or indirect as described below.  Direct blow (running into another player during a soccer game, being hit in a fight, or hitting your head on a hard surface).   Indirect blow (when your head moves rapidly and violently back and forth like in a car crash).  SYMPTOMS  The brain is very complex. Every head injury is different. Some symptoms may appear right away. Other symptoms may not show up for days or weeks after the concussion. The signs of concussion can be hard to notice. Early on, problems may be missed by patients, family members, and caregivers. You may look fine even though you are acting or feeling differently.  These symptoms are usually temporary, but may last for days, weeks, or even longer. Symptoms include:  Mild headaches that will not go away.   Having more trouble than usual with:   Remembering things.   Paying attention or concentrating.   Organizing daily tasks.   Making decisions and solving problems.   Slowness in thinking, acting, speaking, or reading.   Getting lost or easily confused.   Feeling tired all the time or lacking energy (fatigue).   Feeling drowsy.   Sleep disturbances.   Sleeping more than usual.   Sleeping less than usual.   Trouble falling asleep.   Trouble sleeping (insomnia).   Loss of balance or feeling lightheaded or dizzy.   Nausea or vomiting.   Numbness or tingling.     Increased sensitivity to:   Sounds.   Lights.   Distractions.  Other symptoms might include:  Vision problems or eyes that tire easily.   Diminished sense of taste or smell.   Ringing in the ears.   Mood changes such as feeling sad, anxious, or listless.   Becoming easily irritated or angry for little or no reason.   Lack of motivation.  DIAGNOSIS  Your caregiver can usually diagnose a concussion or mild brain injury based on your description of your injury and your symptoms.  Your evaluation might include:  A brain scan to look for signs of injury to the brain. Even if the test shows no injury, you may still have a concussion.   Blood tests to be sure other problems are not present.  TREATMENT   People with a concussion need to be examined and evaluated. Most people with concussions are treated in an emergency department, urgent care, or clinic. Some people must stay in the hospital overnight for further treatment.   Your caregiver will send you home with important instructions to follow. Be sure to carefully follow them.   Tell your caregiver if you are already taking any medicines (prescription, over-the-counter, or natural remedies), or if you are drinking alcohol or taking illegal drugs. Also, talk with your caregiver if you are taking blood thinners (anticoagulants) or aspirin. These drugs may increase your chances of complications. All  of this is important information that may affect treatment.   Only take over-the-counter or prescription medicines for pain, discomfort, or fever as directed by your caregiver.  PROGNOSIS  How fast people recover from brain injury varies from person to person. Although most people have a good recovery, how quickly they improve depends on many factors. These factors include how severe their concussion was, what part of the brain was injured, their age, and how healthy they were before the concussion.  Because all head injuries are  different, so is recovery. Most people with mild injuries recover fully. Recovery can take time. In general, recovery is slower in older persons. Also, persons who have had a concussion in the past or have other medical problems may find that it takes longer to recover from their current injury. Anxiety and depression may also make it harder to adjust to the symptoms of brain injury. HOME CARE INSTRUCTIONS  Return to your normal activities slowly, not all at once. You must give your body and brain enough time for recovery.  Get plenty of sleep at night, and rest during the day. Rest helps the brain to heal.   Avoid staying up late at night.   Keep the same bedtime hours on weekends and weekdays.   Take daytime naps or rest breaks when you feel tired.   Limit activities that require a lot of thought or concentration (brain or cognitive rest). This includes:   Homework or job-related work.   Watching TV.   Computer work.   Avoid activities that could lead to a second brain injury, such as contact or recreational sports, until your caregiver says it is okay. Even after your brain injury has healed, you should protect yourself from having another concussion.   Ask your caregiver when you can return to your normal activities such as driving, bicycling, or operating heavy equipment. Your ability to react may be slower after a brain injury.   Talk with your caregiver about when you can return to work or school.   Inform your teachers, school nurse, school counselor, coach, Event organiser, or work Production designer, theatre/television/film about your injury, symptoms, and restrictions. They should be instructed to report:   Increased problems with attention or concentration.   Increased problems remembering or learning new information.   Increased time needed to complete tasks or assignments.   Increased irritability or decreased ability to cope with stress.   Increased symptoms.   Take only those medicines that your  caregiver has approved.   Do not drink alcohol until your caregiver says you are well enough to do so. Alcohol and certain other drugs may slow your recovery and can put you at risk of further injury.   If it is harder than usual to remember things, write them down.   If you are easily distracted, try to do one thing at a time. For example, do not try to watch TV while fixing dinner.   Talk with family members or close friends when making important decisions.   Keep all follow-up appointments. Repeated evaluation of your symptoms is recommended for your recovery.  PREVENTION  Protect your head from future injury. It is very important to avoid another head or brain injury before you have recovered. In rare cases, another injury has lead to permanent brain damage, brain swelling, or death. Avoid injuries by using:  Seatbelts when riding in a car.   Alcohol only in moderation.   A helmet when biking, skiing, skateboarding, skating, or doing  similar activities.   Safety measures in your home.   Remove clutter and tripping hazards from floors and stairways.   Use grab bars in bathrooms and handrails by stairs.   Place non-slip mats on floors and in bathtubs.   Improve lighting in dim areas.  SEEK MEDICAL CARE IF:  A head injury can cause lingering symptoms. You should seek medical care if you have any of the following symptoms for more than 3 weeks after your injury or are planning to return to sports:  Chronic headaches.   Dizziness or balance problems.   Nausea.   Vision problems.   Increased sensitivity to noise or light.   Depression or mood swings.   Anxiety or irritability.   Memory problems.   Difficulty concentrating or paying attention.   Sleep problems.   Feeling tired all the time.  SEEK IMMEDIATE MEDICAL CARE IF:  You have had a blow or jolt to the head and you (or your family or friends) notice:  Severe or worsening headaches.   Weakness (even if only  in one hand or one leg or one part of the face), numbness, or decreased coordination.   Repeated vomiting.   Increased sleepiness or passing out.   One black center of the eye (pupil) is larger than the other.   Convulsions (seizures).   Slurred speech.   Increasing confusion, restlessness, agitation, or irritability.   Lack of ability to recognize people or places.   Neck pain.   Difficulty being awakened.   Unusual behavior changes.   Loss of consciousness.  Older adults with a brain injury may have a higher risk of serious complications such as a blood clot on the brain. Headaches that get worse or an increase in confusion are signs of this complication. If these signs occur, see a caregiver right away. MAKE SURE YOU:   Understand these instructions.   Will watch your condition.   Will get help right away if you are not doing well or get worse.  FOR MORE INFORMATION  Several groups help people with brain injury and their families. They provide information and put people in touch with local resources. These include support groups, rehabilitation services, and a variety of health care professionals. Among these groups, the Brain Injury Association (BIA, www.biausa.org) has a Secretary/administrator that gathers scientific and educational information and works on a national level to help people with brain injury.  Document Released: 10/26/2003 Document Revised: 07/25/2011 Document Reviewed: 03/23/2008 [facility] Patient Information 2012 [place], [place].   Home Safety and Preventing Falls Falls are a leading cause of injury and while they affect all age groups, falls have greater short-term and long-term impact on older age groups. However, falls should not be a part of life or aging. It is possible for individuals and their families to use preventive measures to significantly decrease the likelihood that anyone, especially an older adult, will fall. There are many simple measures which  can make your home safer with respect to preventing falls. The following actions can help reduce falls among all members of your family and are especially important as you age, when your balance, lower limb strength, coordination, and eyesight may be declining. The use of preventive measures will help to reduce you and your family's risk of falls and serious medical consequences. OUTDOORS  Repair cracks and edges of walkways and driveways.   Remove high doorway thresholds and trim shrubbery on the main path into your home.   Ensure there is good  outside lighting at main entrances and along main walkways.   Clear walkways of tools, rocks, debris, and clutter.   Check that handrails are not broken and are securely fastened. Both sides of steps should have handrails.   In the garage, be attentive to and clean up grease or oil spills on the cement. This can make the surface extremely slippery.   In winter, have leaves, snow, and ice cleared regularly.   Use sand or salt on walkways during winter months.  BATHROOM  Install grab bars by the toilet and in the tub and shower.   Use non-skid mats or decals in the tub or shower.   If unable to easily stand unsupported while showering, place a plastic non slip stool in the shower to sit on when needed.   Install night lights.   Keep floors dry and clean up all water on the floor immediately.   Remove soap buildup in tub or shower on a regular basis.   Secure bath mats with non-slip, double-sided rug tape.   Remove tripping hazards from the floors.  BEDROOMS  Install night lights.   Do not use oversized bedding.   Make sure a bedside light is easy to reach.   Keep a telephone by your bedside.   Make sure that you can get in and out of your bed easily.   Have a firm chair, with side arms, to use for getting dressed.   Remove clutter from around closets.   Store clothing, bed coverings, and other household items where you can  reach them comfortably.   Remove tripping hazards from the floor.  LIVING AREAS AND STAIRWAYS  Turn on lights to avoid having to walk through dark areas.   Keep lighting uniform in each room. Place brighter lightbulbs in darker areas, including stairways.   Replace lightbulbs that burn out in stairways immediately.   Arrange furniture to provide for clear pathways.   Keep furniture in the same place.   Eliminate or tape down electrical cables in high traffic areas.   Place handrails on both sides of stairways. Use handrails when going up or down stairs.   Most falls occur on the top or bottom 3 steps.   Fix any loose handrails. Make sure handrails on both sides of the stairways are as long as the stairs.   Remove all walkway obstacles.   Coil or tape electrical cords off to the side of walking areas and out of the way. If using many extension cords, have an electrician put in a new wall outlet to reduce or eliminate them.   Make sure spills are cleaned up quickly and allow time for drying before walking on freshly cleaned floors.   Firmly attach carpet with non-skid or two-sided tape.   Keep frequently used items within easy reach.   Remove tripping hazards such as throw rugs and clutter in walkways. Never leave objects on stairs.   Get rid of throw rugs elsewhere if possible.   Eliminate uneven floor surfaces.   Make sure couches and chairs are easy to get into and out of.   Check carpeting to make sure it is firmly attached along stairs.   Make repairs to worn or loose carpet promptly.   Select a carpet pattern that does not visually hide the edge of steps.   Avoid placing throw rugs or scatter rugs at the top or bottom of stairways, or properly secure with carpet tape to prevent slippage.   Have an  electrician put in a light switch at the top and bottom of the stairs.   Get light switches that glow.   Avoid the following practices: hurrying, inattention,  obscured vision, carrying large loads, and wearing slip-on shoes.   Be aware of all pets.  KITCHEN  Place items that are used frequently, such as dishes and food, within easy reach.   Keep handles on pots and pans toward the center of the stove. Use back burners when possible.   Make sure spills are cleaned up quickly and allow time for drying.   Avoid walking on wet floors.   Avoid hot utensils and knives.   Position shelves so they are not too high or low.   Place commonly used objects within easy reach.   If necessary, use a sturdy step stool with a grab bar when reaching.   Make sure electrical cables are out of the way.   Do not use floor polish or wax that makes floors slippery.  OTHER HOME FALL PREVENTION STRATEGIES  Wear low heel or rubber sole shoes that are supportive and fit well.   Wear closed toe shoes.   Know and watch for side effects of medications. Have your caregiver or pharmacist look at all your medicines, even over-the-counter medicines. Some medicines can make you sleepy or dizzy.   Exercise regularly. Exercise makes you stronger and improves your balance and coordination.   Limit use of alcohol.   Use eyeglasses if necessary and keep them clean. Have your vision checked every year.   Organize your household in a manner that minimizes the need to walk distances when hurried, or go up and down stairs unnecessarily. For example, have a phone placed on at least each floor of your home. If possible, have a phone beside each sitting or lying area where you spend the most time at home. Keep emergency numbers posted at all phones.   Use non-skid floor wax.   When using a ladder, make sure:   The base is firm.   All ladder feet are on level ground.   The ladder is angled against the wall properly.   When climbing a ladder, face the ladder and hold the ladder rungs firmly.   If reaching, always keep your hips and body weight centered between the rails.    When using a stepladder, make sure it is fully opened and both spreaders are firmly locked.   Do not climb a closed stepladder.   Avoid climbing beyond the second step from the top of a stepladder and the 4th rung from the top of an extension ladder.   Learn and use mobility aids as needed.   Change positions slowly. Arise slowly from sitting and lying positions. Sit on the edge of your bed before getting to your feet.   If you have a history of falls, ask someone to add color or contrast paint or tape to grab bars and handrails in your home.   If you have a history of falls, ask someone to place contrasting color strips on first and last steps.   Install an electrical emergency response system if you need one, and know how to use it.   If you have a medical or other condition that causes you to have limited physical strength, it is important that you reach out to family and friends for occasional help.  FOR CHILDREN:  If young children are in the home, use safety gates. At the top of stairs use screw-mounted  gates; use pressure-mounted gates for the bottom of the stairs and doorways between rooms.   Young children should be taught to descend stairs on their stomachs, feet first, and later using the handrail.   Keep drawers fully closed to prevent them from being climbed on or pulled out entirely.   Move chairs, cribs, beds and other furniture away from windows.   Consider installing window guards on windows ground floor and up, unless they are emergency fire exits. Make sure they have easy release mechanisms.   Consider installing special locks that only allow the window to be opened to a certain height.   Never rely on window screens to prevent falls.   Never leave babies alone on changing tables, beds or sofas. Use a changing table that has a restraining strap.   When a child can pull to a standing position, the crib mattress should be adjusted to its lowest position. There  should be at least 26 inches between the top rails of the crib drop side and the mattress. Toys, bumper pads, and other objects that can be used as steps to climb out should be removed from the crib.   On bunk beds never allow a child under age 47 to sleep on the top bunk. For older children, if the upper bunk is not against a wall, use guard rails on both sides. No matter how old a child is, keep the guard rails in place on the top bunk since children roll during sleep. Do not permit horseplay on bunks.   Grass and soil surfaces beneath backyard playground equipment should be replaced with hardwood chips, shredded wood mulch, sand, pea gravel, rubber, crushed stone, or another safer material at depths of at least 9 to 12 inches.   When riding bikes or using skates, skateboards, skis, or snowboards, require children to wear helmets. Look for those that have stickers stating that they meet or exceed safety standards.   Vertical posts or pickets in deck, balcony, and stairway railings should be no more than 3 1/2 inches apart if a young baby will have access to the area. The space between horizontal rails or bars, and between the floor and the first horizontal rail or bar, should be no more than 3 1/2 inches.  Document Released: 07/26/2002 Document Revised: 07/25/2011 Document Reviewed: 05/25/2009 [facility] Patient Information 2012 [place], [place].  Pulmonary Nodule A pulmonary (lung) nodule is small, round growth in the lung. The size of a pulmonary nodule can be as small as a pencil eraser (1/5 inch or 4 millmeters) to a little bigger than your biggest toenail (1 inch or 25 millimeters). A pulmonary nodule is usually an unplanned finding. It may be found on a chest X-ray or a computed tomography (CT) scan when you have imaging tests of your lungs done. When a pulmonary nodule is found, tests will be done to determine if the nodule is benign (not cancerous) or malignant (cancerous). Follow-up treatment or  testing is based on the size of the pulmonary nodule and your risk of getting lung cancer.  CAUSES Causes of pulmonary nodules can vary.  Benign pulmonary nodules  can be caused from different things. Some of these things include:  Infection. This can be a common cause of a benign pulmonary nodule. The infection may be active (a current infection) or an old infection that is no longer active. Three types of infections can cause a pulmonary nodule. These are:   Bacterial Infection.   Fungal infection.  Viral Infections.   Hematoma. This is a bruise in the lung. A hematoma can happen from an injury to your chest.   Some common diseases can lead to benign pulmonary nodules. For example, rheumatoid arthritis can be a cause of a pulmonary nodule.   Other unusual things can cause a benign pulmonary nodule. These can include:   Having had tuberculosis.   Rare diseases, such as a lung cyst.  Malignant pulmonary nodules.  These are cancerous growths. The cancer may have:  Started in the lung. Some lung cancers first detected as a pulmonary nodule.   Spread to the lung from cancer somewhere else in the body. This is called metastatic cancer.   Certain risk factors make a cancerous pulmonary nodule more likely. They include:   Age. As people get older, a pulmonary nodule is more likely to be cancerous.   Cancer history. If one of your immediate family members has had cancer, you have a higher risk of developing cancer.   Smoking. This includes people who currently smoke and those who have quit.  DIAGNOSIS To diagnose whether a pulmonary nodule is benign or malignant, a variety of tests will be done. This includes things such as:  Health history. Questions regarding your current health, past health, and family health will be asked.   Blood tests. Results of blood work can show:   Tumor markers for cancer.   Any type of infection.   A skin test called a tuberculin (TB) test may be  done. This test can tell if you have been exposed to the germ that causes tuberculosis.   Imaging tests. These take pictures of your lungs. Types of imaging tests include:   Chest X-ray. This can help in several ways. An X-ray gives a close-up look at the pulmonary nodule. A new X-ray can be compared with any X-rays you have had in the past.    Computed tomography  (CT) scan. This test shows smaller pulmonary nodules more clearly than an X-ray.   Positron emission tomography  (PET) scan. This is a test that uses a radioactive substance to identify a pulmonary nodule. A safe amount of radioactive substance is injected into the blood stream. Then, the scan takes a picture of the pulmonary nodule. A malignant pulmonary nodule will absorb the substance faster than a benign pulmonary nodule. The radioactive substance is eliminated from your body in your urine.   Biopsy.  This removes a tiny piece of the pulmonary nodule so it can be checked under a microscope. Medicine will be given to help keep you relaxed and pain free when a biopsy is done. Types of biopsies include:   Bronchoscopy . This is a surgical procedure. It can be used for pulmonary nodules that are close to the airways in the lung. It uses a scope (a thin tube) with a tiny camera and light on the end. The scope is put in the windpipe. Your caregiver can then see inside the lung. A tiny tool put through the scope is used to take a small sample of the pulmonary nodule tissue.   Transthoracic needle aspiration . This method is used if the pulmonary nodule is far away from the air passages in the lung. A long, thin needle is put through the chest into the lung nodule. A CT scan is done at the same time which can make it easier to locate the pulmonary nodule.   Surgical lung biopsy . This is a surgical procedure in which the  pulmonary nodule is removed. This is usually recommended when the pulmonary nodule is most likely malignant or a biopsy  cannot be obtained by either bronchoscopy or transthoracic needle aspiration.  PULMONARY NODULE FOLLOW-UP RECOMMENDATIONS The frequency of pulmonary nodule follow-up is based on your risk factors and size of the pulmonary nodule. If your caregiver suspects the pulmonary nodule is cancerous or the pulmonary nodule changes during any of the follow-up CT scans, additional testing or biopsies will be done.   If you have no or low risk of getting lung cancer (non-smoker, no personal cancer history), recommended follow-up is based on the following pulmonary nodule size:   A pulmonary nodule that is < 4 mm does not require any follow-up.   A pulmonary nodule that is 4 to 6 mm should be re-imaged by CT scan in 12 months.   A pulmonary nodule that is 6 to 8 mm should be re-imaged by CT scan at 6 to 12 months and then again at 18 to 24 months if no change in size.   A pulmonary nodule > 8 mm in size should be followed closely and re-imaged by CT scan at 3, 9, and 24 months.    If you are at risk of getting lung cancer (current or former smoker, family history of cancer), recommended follow-up is based on the following pulmonary nodule size:   A pulmonary nodule that is < 4 mm in size should be re-imaged by CT scan in 12 months.   A pulmonary nodule that is 4 to 6 mm in size should be re-imaged by CT scan at 6 to 12 months and again at 18 to 24 months.   A pulmonary nodule that is 6 to 8 mm in size should be re-imaged by CT scan at 3, 9, and 24 months.   A pulmonary nodule > 8 mm in size should be followed closely and re-imaged by CT scan at 3, 9, and 24 months.  SEEK MEDICAL CARE IF: While waiting for test results to determine what type of pulmonary nodule you have, be sure to contact your caregiver if you:  Have trouble breathing when you are active.   Feel sick or unusually tired.   Do not feel like eating.   Lose weight without trying to.   Develop chills or night sweats.   Mild or  moderate fevers generally have no long-term effects and often do not require treatment. There are a few exceptions (see below).  SEEK IMMEDIATE MEDICAL CARE IF:  You cannot catch your breath or you begin wheezing.   You cannot stop coughing.   You cough up blood.   You feel like you are going to pass out or become dizzy.   You have sudden chest pain.   You have a fever or persistent symptoms for more than 72 hours.   You have a fever and your symptoms suddenly get worse.  MAKE SURE YOU   Understand these instructions.   Will watch your condition.   Will get help right away if you are not doing well or get worse.  Document Released: 06/02/2009 Document Revised: 07/25/2011 Document Reviewed: 06/02/2009 [facility] Patient Information 2012 [place], [place].  Acetaminophen; Oxycodone tablets What is this medicine? ACETAMINOPHEN; OXYCODONE (a set a MEE noe fen; ox i KOE done) is a pain reliever. It is used to treat mild to moderate pain. This medicine may be used for other purposes; ask your health care provider or pharmacist if you have questions. What should  I tell my health care provider before I take this medicine? They need to know if you have any of these conditions: -brain tumor -Crohn's disease, inflammatory bowel disease, or ulcerative colitis -drink more than 3 alcohol containing drinks per day -drug abuse or addiction -head injury -heart or circulation problems -kidney disease or problems going to the bathroom -liver disease -lung disease, asthma, or breathing problems -an unusual or allergic reaction to acetaminophen, oxycodone, other opioid analgesics, other medicines, foods, dyes, or preservatives -pregnant or trying to get pregnant -breast-feeding How should I use this medicine? Take this medicine by mouth with a full glass of water. Follow the directions on the prescription label. Take your medicine at regular intervals. Do not take your medicine more often than  directed. Talk to your pediatrician regarding the use of this medicine in children. Special care may be needed. Patients over 26 years old may have a stronger reaction and need a smaller dose. Overdosage: If you think you have taken too much of this medicine contact a poison control center or emergency room at once. NOTE: This medicine is only for you. Do not share this medicine with others. What if I miss a dose? If you miss a dose, take it as soon as you can. If it is almost time for your next dose, take only that dose. Do not take double or extra doses. What may interact with this medicine? -alcohol or medicines that contain alcohol -antihistamines -barbiturates like amobarbital, butalbital, butabarbital, methohexital, pentobarbital, phenobarbital, thiopental, and secobarbital -benztropine -drugs for bladder problems like solifenacin, trospium, oxybutynin, tolterodine, hyoscyamine, and methscopolamine -drugs for breathing problems like ipratropium and tiotropium -drugs for certain stomach or intestine problems like propantheline, homatropine methylbromide, glycopyrrolate, atropine, belladonna, and dicyclomine -general anesthetics like etomidate, ketamine, nitrous oxide, propofol, desflurane, enflurane, halothane, isoflurane, and sevoflurane -medicines for depression, anxiety, or psychotic disturbances -medicines for pain like codeine, morphine, pentazocine, buprenorphine, butorphanol, nalbuphine, tramadol, and propoxyphene -medicines for sleep -muscle relaxants -naltrexone -phenothiazines like perphenazine, thioridazine, chlorpromazine, mesoridazine, fluphenazine, prochlorperazine, promazine, and trifluoperazine -scopolamine -trihexyphenidyl This list may not describe all possible interactions. Give your health care provider a list of all the medicines, herbs, non-prescription drugs, or dietary supplements you use. Also tell them if you smoke, drink alcohol, or use illegal drugs. Some  items may interact with your medicine. What should I watch for while using this medicine? Tell your doctor or health care professional if your pain does not go away, if it gets worse, or if you have new or a different type of pain. You may develop tolerance to the medicine. Tolerance means that you will need a higher dose of the medication for pain relief. Tolerance is normal and is expected if you take this medicine for a long time. Do not suddenly stop taking your medicine because you may develop a severe reaction. Your body becomes used to the medicine. This does NOT mean you are addicted. Addiction is a behavior related to getting and using a drug for a nonmedical reason. If you have pain, you have a medical reason to take pain medicine. Your doctor will tell you how much medicine to take. If your doctor wants you to stop the medicine, the dose will be slowly lowered over time to avoid any side effects. You may get drowsy or dizzy. Do not drive, use machinery, or do anything that needs mental alertness until you know how this medicine affects you. Do not stand or sit up quickly, especially if you are an older  patient. This reduces the risk of dizzy or fainting spells. Alcohol may interfere with the effect of this medicine. Avoid alcoholic drinks. The medicine will cause constipation. Try to have a bowel movement at least every 2 to 3 days. If you do not have a bowel movement for 3 days, call your doctor or health care professional. Do not take Tylenol (acetaminophen) or medicines that have acetaminophen with this medicine. Too much acetaminophen can be very dangerous. Many nonprescription medicines contain acetaminophen. Always read the labels carefully to avoid taking more acetaminophen. What side effects may I notice from receiving this medicine? Side effects that you should report to your doctor or health care professional as soon as possible: -allergic reactions like skin rash, itching or hives,  swelling of the face, lips, or tongue -breathing difficulties, wheezing -confusion -light headedness or fainting spells -severe stomach pain -yellowing of the skin or the whites of the eyes Side effects that usually do not require medical attention (report to your doctor or health care professional if they continue or are bothersome): -dizziness -drowsiness -nausea -vomiting This list may not describe all possible side effects. Call your doctor for medical advice about side effects. You may report side effects to FDA at 1-800-FDA-1088. Where should I keep my medicine? Keep out of the reach of children. This medicine can be abused. Keep your medicine in a safe place to protect it from theft. Do not share this medicine with anyone. Selling or giving away this medicine is dangerous and against the law. Store at room temperature between 20 and 25 degrees C (68 and 77 degrees F). Keep container tightly closed. Protect from light. Flush any unused medicines down the toilet. Do not use the medicine after the expiration date. NOTE: This sheet is a summary. It may not cover all possible information. If you have questions about this medicine, talk to your doctor, pharmacist, or health care provider.  2012, [employer]. (07/04/2008 10:01:21 AM)

## 2012-01-04 NOTE — ED Provider Notes (Addendum)
History     CSN: [id number]  Arrival date & time 01/04/12  1245   First MD Initiated Contact with Patient 01/04/12 1302      Chief Complaint  Patient presents with  . Head Injury  . Nausea  . Altered Mental Status    (Consider location/radiation/quality/duration/timing/severity/associated sxs/prior treatment) HPI Comments: Patient had a sudden syncopal episode at 12:30 PM today. Husband states he heard a "thud " found her on the carpeted floor of the closet, confused. Patient has complete amnesia for the event, and does not remember anything from yesterday.  Husband states that the patient was her normal self up until he heard her fall. He thinks she may have gotten her foot entangled in a piece of clothing, but is not sure. Patient currently complains of nausea. No headache, chest pain, neck pain, back pain, abdominal pain, shortness of breath. No dysarthria, extremity weakness facial droop. Patient denies taking any extra medications. Denies any excessive alcohol intake. No illicit drug use. Husband denies presence of any sedative hypnotics or other sedatives, narcotics in the house. Patient has a history of hypertension and asthma. No history of stroke, diabetes, arrhythmia. She is not on any blood thinners. No recent history of travel, prolonged immobilization, history of DVT or PE, calf swelling, tenderness.   ROS as noted in HPI. All other ROS negative.   Patient is a 78 y.o. female presenting with syncope. The history is provided by the patient. No language interpreter was used.  Loss of Consciousness This is a new problem. The current episode started less than 1 hour ago. The problem has not changed since onset.Pertinent negatives include no chest pain, no abdominal pain and no headaches. The symptoms are aggravated by nothing. The symptoms are relieved by nothing. She has tried nothing for the symptoms. The treatment provided no relief.    Past Medical History  Diagnosis Date    . Osteopenia   . Allergic rhinitis, cause unspecified   . Esophageal reflux   . Irritable bowel syndrome   . Mixed hyperlipidemia   . Unspecified sleep apnea     marginal; intolerant to CPAP  . Unspecified essential hypertension   . Unspecified hypothyroidism   . Neuralgia, neuritis, and radiculitis, unspecified   . Carpal tunnel syndrome   . Unspecified asthma   . Diverticulosis     Past Surgical History  Procedure Date  . Facial plastic surgery   . Tonsillectomy and adenoidectomy     ? uvulectomy  . Colonoscopy     Tics    Family History  Problem Relation Age of Onset  . Heart failure Mother     liver disease  . Stroke Maternal Aunt   . COPD Maternal Aunt   . Lung cancer Maternal Aunt   . Asthma Maternal Grandfather     History  Substance Use Topics  . Smoking status: Never Smoker   . Smokeless tobacco: Not on file  . Alcohol Use: 4.2 oz/week    7 Glasses of wine per week     Wine    OB History    Grav Para Term Preterm Abortions TAB SAB Ect Mult Living                  Review of Systems  Cardiovascular: Positive for syncope. Negative for chest pain.  Gastrointestinal: Negative for abdominal pain.  Neurological: Negative for headaches.    Allergies  Amlodipine besylate; Atorvastatin; and Niacin  Home Medications   Current Outpatient Rx  Name Route Sig Dispense Refill  . ALBUTEROL SULFATE HFA 108 (90 BASE) MCG/ACT IN AERS Inhalation Inhale 2 puffs into the lungs as needed.      . BUDESONIDE 0.5 MG/2ML IN SUSP Nebulization Take 0.5 mg by nebulization as needed.      [place] IN Inhalation Inhale 2 puffs into the lungs 2 (two) times daily. Symbicort-strength unknown     . CALCIUM 600 PO Oral Take by mouth. 1/2 by mouth two times daily      . CARDIZEM CD 120 MG PO CP24  TAKE 1 CAPSULE EVERY DAY. 90 each 1  . VITAMIN D 1000 UNITS PO TABS Oral Take 1,000 Units by mouth daily.      Marland Kitchen CITALOPRAM HYDROBROMIDE 20 MG PO TABS Oral Take 20 mg by mouth  daily as needed. For depression     . COQ10 100 MG PO CAPS Oral Take by mouth daily.      . B-12 1000 MCG PO CAPS Oral Take by mouth daily. Chewable    . FLUTICASONE PROPIONATE 50 MCG/ACT NA SUSP Nasal Place 2 sprays into the nose 2 (two) times daily.     Marland Kitchen LEVALBUTEROL HCL 1.25 MG/3ML IN NEBU Nebulization Take 1 ampule by nebulization 2 (two) times daily.      Marland Kitchen LEVOCETIRIZINE DIHYDROCHLORIDE 5 MG PO TABS Oral Take 5 mg by mouth every evening.      Marland Kitchen LEVOTHYROXINE SODIUM 25 MCG PO TABS Oral Take 1 tablet (25 mcg total) by mouth daily. 90 tablet 2    Due for fasting labs and Cpx for further refills  . LISINOPRIL 10 MG PO TABS Oral Take 1 tablet (10 mg total) by mouth daily. 90 tablet 2  . PRAVASTATIN SODIUM 40 MG PO TABS  TAKE  ONE TABLET BY MOUTH NIGHTLY AT BEDTIME 90 tablet 0  . PROBIOTIC PO Oral Take by mouth daily.        BP 165/80  Pulse 69  Temp(Src) 98.5 F (36.9 C) (Oral)  Resp 16  Physical Exam  Nursing note and vitals reviewed. Constitutional: She is oriented to person, place, and time. She appears well-developed and well-nourished.  HENT:  Head: Normocephalic and atraumatic.  Nose: Nose normal.  Mouth/Throat: Uvula is midline, oropharynx is clear and moist and mucous membranes are normal.  Eyes: Conjunctivae and EOM are normal. Pupils are equal, round, and reactive to light.       Pinpoint b/l   Neck: Normal range of motion. Neck supple. No spinous process tenderness present. Normal range of motion present.  Cardiovascular: Normal rate, regular rhythm, normal heart sounds, intact distal pulses and normal pulses.   No murmur heard. Pulmonary/Chest: Effort normal and breath sounds normal. She exhibits no tenderness.       No bruising  Abdominal: Soft. Bowel sounds are normal. She exhibits no distension. There is no tenderness. There is no rebound and no guarding.  Musculoskeletal: Normal range of motion. She exhibits no edema and no tenderness.       Thoracic back:  Normal.       Lumbar back: Normal.  Neurological: She is alert and oriented to person, place, and time. She has normal strength. No cranial nerve deficit or sensory deficit.       Oriented to self. Responded properly to questions and commands. Speech fluent. Patient able to transfer self from wheelchair to bed.  Skin: Skin is warm and dry.  Psychiatric: She has a normal mood and affect. Her behavior is normal. Judgment  and thought content normal.    ED Course  Procedures (including critical care time)  Labs Reviewed  GLUCOSE, CAPILLARY - Abnormal; Notable for the following:    Glucose-Capillary 111 (*)    All other components within normal limits   No results found.   1. Syncope   2. Amnesia    EKG is normal sinus rhythm, rate 70. Normal intervals, normal axis. No hypertrophy. No ST T-wave changes compared to previous EKG from December 2012.   MDM  Patient seen immediately on arrival.  Patient is awake, alert, speech fluent, responds appropriately, moving all extremities. No facial droop. Pin point pupils bilaterally. No C-spine, T-spine, L-spine tenderness. Heart regular, lungs clear. Fingerstick 111. No STEMI or ischemic changes on EKG. questionable history of trauma. Concern for stroke, arrhythmia causing syncope. Transferring to the ED.    Luiz Blare, MD 01/04/12 1323  Luiz Blare, MD 01/04/12 [phone]

## 2012-01-04 NOTE — ED Provider Notes (Signed)
History     CSN: [id number]  Arrival date & time 01/04/12  1338   First MD Initiated Contact with Patient 01/04/12 1340      Chief Complaint  Patient presents with  . Fall    (Consider location/radiation/quality/duration/timing/severity/associated sxs/prior treatment) Patient is a 78 y.o. female presenting with fall. The history is provided by the patient and the spouse.  Fall  She was in her closet on a step stool to getting some clothes down when she apparently fell and hit her head. Her husband was in the next room and heard a side and immediately found her on the floor. She will is days and with her eyes closed but was able to talk to him immediately. He states that she was confused but her memory is improving. She remembers being on the stool and then the next thing she remembers was in the car going to the hospital. Her husband [facility] her [facility] where she was evaluated and transferred here by ambulance. EMS did place the patient in a long spine board with cervical collar immobilization. Only complaint is pain in the back of her head and nausea. Denies any weakness, numbness, tingling. She denies back, chest, abdomen, extremity injury.  Past Medical History  Diagnosis Date  . Osteopenia   . Allergic rhinitis, cause unspecified   . Esophageal reflux   . Irritable bowel syndrome   . Mixed hyperlipidemia   . Unspecified sleep apnea     marginal; intolerant to CPAP  . Unspecified essential hypertension   . Unspecified hypothyroidism   . Neuralgia, neuritis, and radiculitis, unspecified   . Carpal tunnel syndrome   . Unspecified asthma   . Diverticulosis     Past Surgical History  Procedure Date  . Facial plastic surgery   . Tonsillectomy and adenoidectomy     ? uvulectomy  . Colonoscopy     Tics    Family History  Problem Relation Age of Onset  . Heart failure Mother     liver disease  . Stroke Maternal Aunt   . COPD Maternal Aunt   . Lung  cancer Maternal Aunt   . Asthma Maternal Grandfather     History  Substance Use Topics  . Smoking status: Never Smoker   . Smokeless tobacco: Not on file  . Alcohol Use: 4.2 oz/week    7 Glasses of wine per week     Wine    OB History    Grav Para Term Preterm Abortions TAB SAB Ect Mult Living                  Review of Systems  All other systems reviewed and are negative.    Allergies  Amlodipine besylate; Atorvastatin; and Niacin  Home Medications   Current Outpatient Rx  Name Route Sig Dispense Refill  . ALBUTEROL SULFATE HFA 108 (90 BASE) MCG/ACT IN AERS Inhalation Inhale 2 puffs into the lungs as needed.      . BUDESONIDE 0.5 MG/2ML IN SUSP Nebulization Take 0.5 mg by nebulization as needed.      [place] IN Inhalation Inhale 2 puffs into the lungs 2 (two) times daily. Symbicort-strength unknown     . CALCIUM 600 PO Oral Take by mouth. 1/2 by mouth two times daily      . CARDIZEM CD 120 MG PO CP24  TAKE 1 CAPSULE EVERY DAY. 90 each 1  . VITAMIN D 1000 UNITS PO TABS Oral Take 1,000 Units  by mouth daily.      Marland Kitchen CITALOPRAM HYDROBROMIDE 20 MG PO TABS Oral Take 20 mg by mouth daily as needed. For depression     . COQ10 100 MG PO CAPS Oral Take by mouth daily.      . B-12 1000 MCG PO CAPS Oral Take by mouth daily. Chewable    . FLUTICASONE PROPIONATE 50 MCG/ACT NA SUSP Nasal Place 2 sprays into the nose 2 (two) times daily.     Marland Kitchen LEVALBUTEROL HCL 1.25 MG/3ML IN NEBU Nebulization Take 1 ampule by nebulization 2 (two) times daily.      Marland Kitchen LEVOCETIRIZINE DIHYDROCHLORIDE 5 MG PO TABS Oral Take 5 mg by mouth every evening.      Marland Kitchen LEVOTHYROXINE SODIUM 25 MCG PO TABS Oral Take 1 tablet (25 mcg total) by mouth daily. 90 tablet 2    Due for fasting labs and Cpx for further refills  . LISINOPRIL 10 MG PO TABS Oral Take 1 tablet (10 mg total) by mouth daily. 90 tablet 2  . PRAVASTATIN SODIUM 40 MG PO TABS  TAKE  ONE TABLET BY MOUTH NIGHTLY AT BEDTIME 90 tablet 0  . PROBIOTIC PO  Oral Take by mouth daily.        There were no vitals taken for this visit.  Physical Exam  Nursing note and vitals reviewed.  78 year old female is on a long spine board with stiff cervical collar in place. She is in no acute distress. Vital signs are significant for moderate systolic hypertension with blood pressure 165/80. Oxygen saturation is 96% which is normal. Head is normocephalic and atraumatic. PERRLA, EOMI. Neck is nontender. Back is nontender. Lungs are clear without rales, wheezes, or rhonchi. Heart has regular rate and rhythm without murmur. His no chest wall tenderness. Abdomen is soft, flat, nontender without masses or hepatosplenomegaly. Pelvis is nontender and stable. Extremities have full range of motion of all joints without pain. Skin is warm and dry without rash. Neurologic: She is awake, alert, oriented x3. Speech is fluent and without any dysarthria and has normal content. Cranial nerves are intact. There are no focal motor or sensory deficits.  ED Course  Procedures (including critical care time)   Labs Reviewed  CBC  DIFFERENTIAL  BASIC METABOLIC PANEL   No results found.  ECG shows normal sinus rhythm with a rate of 62, no ectopy. Normal axis. Normal P wave. Normal QRS. Normal intervals. Normal ST and T waves. Impression: normal ECG. When compared with ECG of 08/19/2011, no significant changes are seen.   1. Fall   2. Concussion       MDM  Concussion secondary to fall of off of a step stool. Nothing in the history suggests stroke or arrhythmia. Pattern of headache, nausea, and amnesia around the event is very typical of a concussion. CT scan will be obtained and x-rays will be checked. Her record from her urgent care visit was reviewed and the treating physician was worried about stroke and arrhythmia. However, as noted, her symptoms are well explained by fall with concussion.  CT is significant for incidental finding of a lung nodule. Patient is informed of  this and including the need for followup CT in 12 months. Her husband states that her mental state continues to improve. She is complaining of a headache and is given a prescription for Percocet for pain.      Dione Booze, MD 01/04/12 [phone]

## 2012-01-04 NOTE — ED Notes (Signed)
Pt fell and struck the back of her head 25 minutes ago, husband heard her fall and found her on her back with her eyes closed.  She opened her eyes and responded to him and he brought her here.  Pt knows dob and doesn't recall any details before the fall.  She doesn't know the day of the week.  Husband states she was her normal self prior to the fall.  Pt complaints of nausea.

## 2012-01-04 NOTE — ED Notes (Signed)
Per [facility], pt fell today, hit head on carpet, No LOC, after fall pt confused, taken to [facility] by family. 20 L. Forearm. A x 4. Denying any neck pain, 4/10 head pain. No neuro deficits. No blood thinners. 172/91, 69, 96% RA Pupils 2+.

## 2012-01-07 ENCOUNTER — Telehealth: Payer: Self-pay | Admitting: Gastroenterology

## 2012-01-07 NOTE — Telephone Encounter (Signed)
Pt states she has taken one dose of miralax and has just passed a few "balls" of stool. Let pt know she can take up to 3 doses of miralax a day. Pt to try this and she was instructed to call if this does not work. Pt verbalized understanding.

## 2012-02-27 ENCOUNTER — Encounter: Payer: Self-pay | Admitting: Family Medicine

## 2012-02-27 ENCOUNTER — Telehealth: Payer: Self-pay | Admitting: Internal Medicine

## 2012-02-27 ENCOUNTER — Ambulatory Visit (INDEPENDENT_AMBULATORY_CARE_PROVIDER_SITE_OTHER): Payer: Medicare Other | Admitting: Family Medicine

## 2012-02-27 VITALS — BP 130/86 | HR 89 | Temp 98.7°F | Wt 134.4 lb

## 2012-02-27 DIAGNOSIS — R2689 Other abnormalities of gait and mobility: Secondary | ICD-10-CM

## 2012-02-27 DIAGNOSIS — R42 Dizziness and giddiness: Secondary | ICD-10-CM

## 2012-02-27 DIAGNOSIS — E039 Hypothyroidism, unspecified: Secondary | ICD-10-CM

## 2012-02-27 DIAGNOSIS — IMO0001 Reserved for inherently not codable concepts without codable children: Secondary | ICD-10-CM

## 2012-02-27 DIAGNOSIS — I10 Essential (primary) hypertension: Secondary | ICD-10-CM

## 2012-02-27 DIAGNOSIS — N390 Urinary tract infection, site not specified: Secondary | ICD-10-CM

## 2012-02-27 DIAGNOSIS — R269 Unspecified abnormalities of gait and mobility: Secondary | ICD-10-CM

## 2012-02-27 DIAGNOSIS — X58XXXS Exposure to other specified factors, sequela: Secondary | ICD-10-CM

## 2012-02-27 LAB — CBC WITH DIFFERENTIAL/PLATELET
Basophils Relative: 0.5 % (ref 0.0–3.0)
Eosinophils Absolute: 0 10*3/uL (ref 0.0–0.7)
Eosinophils Relative: 0.3 % (ref 0.0–5.0)
HCT: 43.6 % (ref 36.0–46.0)
Lymphs Abs: 2.3 10*3/uL (ref 0.7–4.0)
MCHC: 32.9 g/dL (ref 30.0–36.0)
MCV: 95.8 fl (ref 78.0–100.0)
Monocytes Absolute: 0.4 10*3/uL (ref 0.1–1.0)
RBC: 4.55 Mil/uL (ref 3.87–5.11)
WBC: 10.3 10*3/uL (ref 4.5–10.5)

## 2012-02-27 LAB — BASIC METABOLIC PANEL
BUN: 13 mg/dL (ref 6–23)
CO2: 26 mEq/L (ref 19–32)
Chloride: 101 mEq/L (ref 96–112)
Potassium: 3.5 mEq/L (ref 3.5–5.1)

## 2012-02-27 LAB — POCT URINALYSIS DIPSTICK
Nitrite, UA: NEGATIVE
Protein, UA: NEGATIVE
Urobilinogen, UA: 0.2

## 2012-02-27 LAB — HEPATIC FUNCTION PANEL
ALT: 13 U/L (ref 0–35)
Albumin: 4.1 g/dL (ref 3.5–5.2)
Total Protein: 7.8 g/dL (ref 6.0–8.3)

## 2012-02-27 NOTE — Patient Instructions (Addendum)

## 2012-02-27 NOTE — Telephone Encounter (Signed)
Noted, patient with pending appointment  

## 2012-02-27 NOTE — Progress Notes (Signed)
  Subjective:    Patient ID: Valerie Decker, female    DOB: Mar 25, 1931, 78 y.o.   MRN: [id number]  HPI Pt here c/o dizziness for several days -- she had a headache the first 2 days and diarrhea but now she is only has the dizziness.   Pt had a fall and hit her head in May-- see CT -- she has had no more issues since concussion.      Review of Systems As above    Objective:   Physical Exam  Constitutional: She is oriented to person, place, and time. She appears well-developed and well-nourished.  Cardiovascular: Normal rate, regular rhythm and normal heart sounds.   No murmur heard. Musculoskeletal: Normal range of motion. She exhibits no edema and no tenderness.  Neurological: She is alert and oriented to person, place, and time. No cranial nerve deficit. She exhibits normal muscle tone. Coordination normal.  Psychiatric: She has a normal mood and affect. Her behavior is normal. Judgment and thought content normal.          Assessment & Plan:   Subjective:

## 2012-02-27 NOTE — Telephone Encounter (Signed)
Caller: Valerie Decker/Patient; PCP: Marga Melnick; CB#: [phone]; ; ; Call regarding Dizziness;  Onset- 02/25/12  Afebrile. Pt is having dizziness and nausea. Symptoms are worse with turning the head.  Emergent s/s of Dizziness or Vertigo protocol r/o. Pt to see provider within 24hrs. Appt scheduled for today at 2:00pm. With Dr. Laury Axon.

## 2012-02-28 ENCOUNTER — Encounter: Payer: Self-pay | Admitting: Family Medicine

## 2012-02-28 DIAGNOSIS — R42 Dizziness and giddiness: Secondary | ICD-10-CM | POA: Insufficient documentation

## 2012-02-28 LAB — TSH: TSH: 2.55 u[IU]/mL (ref 0.35–5.50)

## 2012-02-28 NOTE — Assessment & Plan Note (Signed)
Check labs 

## 2012-02-28 NOTE — Assessment & Plan Note (Signed)
Slightly elevated today Watch salt  Recheck in 2-3 weeks

## 2012-02-28 NOTE — Assessment & Plan Note (Signed)
Improved already Check labs

## 2012-03-01 LAB — URINE CULTURE

## 2012-03-12 ENCOUNTER — Ambulatory Visit (INDEPENDENT_AMBULATORY_CARE_PROVIDER_SITE_OTHER): Payer: Medicare Other | Admitting: Internal Medicine

## 2012-03-12 VITALS — BP 138/70 | HR 77 | Wt 136.0 lb

## 2012-03-12 DIAGNOSIS — R911 Solitary pulmonary nodule: Secondary | ICD-10-CM | POA: Insufficient documentation

## 2012-03-12 DIAGNOSIS — T887XXA Unspecified adverse effect of drug or medicament, initial encounter: Secondary | ICD-10-CM | POA: Insufficient documentation

## 2012-03-12 DIAGNOSIS — R42 Dizziness and giddiness: Secondary | ICD-10-CM

## 2012-03-12 DIAGNOSIS — M47812 Spondylosis without myelopathy or radiculopathy, cervical region: Secondary | ICD-10-CM

## 2012-03-12 NOTE — Progress Notes (Signed)
Subjective:    Patient ID: Valerie Decker, female    DOB: [birth year], 78 y.o.   MRN: [id number]  HPI She sustained a concussion falling from a stool while working with her arms over head & neck hyperextended in her closet 01/04/12. She is not remember why she fell.In the ER CAT scan of the head and cervical spine did not show any acute change. Lab studies on that date were essentially negative except for GFR 61, glucose 108, sodium 133 and white count of 10,600. On 7/11 sodium was 137 and GFR 63.39. Approximately one month later she began to have some dizzy spells. 2 episodes occurred while in a reclining position  at the beauty parlor  7/19 and at her dentist office 7/9. As she  was raised , she began to have severe dizziness. The third episode occurred 03/08/12 as she stood  from a hot bath. She had hyperextended her neck soaking in tub Her dizziness has been associated with nausea but no diaphoresis. She states that the room appeared to be moving with these episodes. She has not taken medications for these symptoms   Review of Systems She has not had definite cardiac or neurologic prodrome prior to any of these events. Specifically she denied chest pain, palpitations, heart rhythm or rate change, headache, numbness or tingling ,or weakness in the limbs. She denies benign positional vertigo symptoms. There's been no prodrome prior to these events such as straining or pain. She has not had loss of consciousness or seizure activity. She's had no associated blurred vision, double vision, or loss of vision. There was no hearing loss; tinnitus chronic issue. She also denies dyspnea with any of the events.  Her blood pressure has averaged 131/71 at home. Checking her electronic cuff against the office cuff; it appeared to be reading approximately 10 points higher both systolically and diastolically          Objective:   Physical Exam  Gen. appearance: Well-nourished, in no distress.Appears younger  than stated age  Eyes: Extraocular motion intact, field of vision normal, vision grossly intact, no nystagmus. Pupils small ENT: Canals clear, tympanic membranes normal, tuning fork exam normal, hearing grossly decreased bilaterally Neck: Normal range of motion, no masses, small thyroid Cardiovascular: Rate and rhythm normal; no murmurs, gallops . S 4 Muscle skeletal:  tone &  strength normal. She has osteoarthritic change in her hands. No spinal malalignment is clinically apparent Neuro:no cranial nerve deficit, deep tendon  reflexes normal, gait normal. Finger to nose and Romberg testing is negative Lymph: No cervical or axillary LA  Vascular: No carotid or subclavicular bruits. Radial artery pulses are normal and equal Skin: Warm and dry without suspicious lesions or rashes Psych: no anxiety or mood change. Normally interactive and cooperative.         Assessment & Plan:  #1 fall with concussion 01/04/12. The description suggests again neck hyperextension vertebrobasilar insufficiency. Clinically is no evidence of subclavian steal syndrome  #2 dizziness, positional x3. All episodes of dizziness historically have been in the context of hyper extension of her neck  ; this suggests possibility of positional vertebrobasilar compromise related to cervical osteoarthritis. The most recent episode was also associated with a hot bath which could have also  resulted in vasodilatation and some hypotension.  #3 insignificant 5 mm right apical pulmonary nodule in a nonsmoker  #4 osteoarthritis of the hands  Plan: Neurologic evaluation be pursued for completeness; the most important maneuver  appears to be voiding the cervical  hyperextension. I will asked her to perform isometric exercises prior to standing also. She'll continue blood pressure monitor in the context that her health is measuring higher than the office cuff

## 2012-03-12 NOTE — Patient Instructions (Signed)
Repeat the isometric exercises discussed 4- 5 times prior to standing if you've been seated for a period of time.  

## 2012-03-17 ENCOUNTER — Telehealth: Payer: Self-pay | Admitting: Internal Medicine

## 2012-03-17 MED ORDER — LEVOTHYROXINE SODIUM 25 MCG PO TABS: 25.0000 ug | ORAL_TABLET | Freq: Every day | ORAL | Status: DC

## 2012-03-17 NOTE — Telephone Encounter (Signed)
Refill: Levothyroxine 25 mcg tab. Take 1 tablet each day. Qty 90. Last fill 5.1.13

## 2012-03-17 NOTE — Telephone Encounter (Signed)
RX sent, patient with recent TSH level

## 2012-03-25 ENCOUNTER — Telehealth: Payer: Self-pay | Admitting: Internal Medicine

## 2012-03-25 MED ORDER — LISINOPRIL 10 MG PO TABS: 10.0000 mg | ORAL_TABLET | Freq: Every day | ORAL | Status: DC

## 2012-03-25 NOTE — Telephone Encounter (Signed)
Refill: lisinopril 10mg  tablet. Take 1 tablet once daily. Qty 90. Last fill 12-18-11

## 2012-04-27 ENCOUNTER — Other Ambulatory Visit: Payer: Self-pay | Admitting: Neurology

## 2012-04-27 DIAGNOSIS — S060X9A Concussion with loss of consciousness of unspecified duration, initial encounter: Secondary | ICD-10-CM

## 2012-04-27 DIAGNOSIS — H9190 Unspecified hearing loss, unspecified ear: Secondary | ICD-10-CM

## 2012-04-27 DIAGNOSIS — I10 Essential (primary) hypertension: Secondary | ICD-10-CM

## 2012-04-27 DIAGNOSIS — E785 Hyperlipidemia, unspecified: Secondary | ICD-10-CM

## 2012-04-27 DIAGNOSIS — R42 Dizziness and giddiness: Secondary | ICD-10-CM

## 2012-05-01 ENCOUNTER — Ambulatory Visit
Admission: RE | Admit: 2012-05-01 | Discharge: 2012-05-01 | Disposition: A | Discharge status: Home / Self Care | Payer: Medicare Other | Source: Ambulatory Visit | Attending: Neurology | Admitting: Neurology

## 2012-05-01 DIAGNOSIS — H9190 Unspecified hearing loss, unspecified ear: Secondary | ICD-10-CM

## 2012-05-01 DIAGNOSIS — S060X9A Concussion with loss of consciousness of unspecified duration, initial encounter: Secondary | ICD-10-CM

## 2012-05-01 DIAGNOSIS — I10 Essential (primary) hypertension: Secondary | ICD-10-CM

## 2012-05-01 DIAGNOSIS — R42 Dizziness and giddiness: Secondary | ICD-10-CM

## 2012-05-01 DIAGNOSIS — E785 Hyperlipidemia, unspecified: Secondary | ICD-10-CM

## 2012-05-01 MED ORDER — GADOBENATE DIMEGLUMINE 529 MG/ML IV SOLN
12.0000 mL | Freq: Once | INTRAVENOUS | Status: AC | PRN
  Administered 2012-05-01: 12 mL via INTRAVENOUS

## 2012-05-09 ENCOUNTER — Other Ambulatory Visit: Payer: Self-pay | Admitting: Internal Medicine

## 2012-05-11 NOTE — Telephone Encounter (Signed)
Will need labs prior to more refills.

## 2012-06-09 ENCOUNTER — Other Ambulatory Visit: Payer: Self-pay | Admitting: Internal Medicine

## 2012-06-09 NOTE — Telephone Encounter (Signed)
Lipid/272.4/995.20 

## 2012-06-17 ENCOUNTER — Other Ambulatory Visit (INDEPENDENT_AMBULATORY_CARE_PROVIDER_SITE_OTHER): Payer: Medicare Other

## 2012-06-17 DIAGNOSIS — T887XXA Unspecified adverse effect of drug or medicament, initial encounter: Secondary | ICD-10-CM

## 2012-06-17 DIAGNOSIS — E785 Hyperlipidemia, unspecified: Secondary | ICD-10-CM

## 2012-06-17 LAB — LIPID PANEL
HDL: 51.6 mg/dL (ref >39.00)
Total CHOL/HDL Ratio: 4
VLDL: 41.4 mg/dL — ABNORMAL HIGH (ref 0.0–40.0)

## 2012-06-17 LAB — LDL CHOLESTEROL, DIRECT: Direct LDL: 152.1 mg/dL

## 2012-06-21 ENCOUNTER — Encounter: Payer: Self-pay | Admitting: Internal Medicine

## 2012-07-01 ENCOUNTER — Ambulatory Visit (INDEPENDENT_AMBULATORY_CARE_PROVIDER_SITE_OTHER): Payer: Medicare Other | Admitting: Internal Medicine

## 2012-07-01 ENCOUNTER — Encounter: Payer: Self-pay | Admitting: Internal Medicine

## 2012-07-01 ENCOUNTER — Telehealth: Payer: Self-pay

## 2012-07-01 VITALS — BP 128/70 | HR 70 | Wt 139.4 lb

## 2012-07-01 DIAGNOSIS — E782 Mixed hyperlipidemia: Secondary | ICD-10-CM

## 2012-07-01 DIAGNOSIS — R55 Syncope and collapse: Secondary | ICD-10-CM | POA: Insufficient documentation

## 2012-07-01 MED ORDER — SIMVASTATIN 20 MG PO TABS: 20.0000 mg | ORAL_TABLET | Freq: Every day | ORAL | Status: DC

## 2012-07-01 MED ORDER — SIMVASTATIN 10 MG PO TABS: 10.0000 mg | ORAL_TABLET | Freq: Every day | ORAL | Status: DC

## 2012-07-01 MED ORDER — BUDESONIDE 0.5 MG/2ML IN SUSP: RESPIRATORY_TRACT | Status: DC

## 2012-07-01 NOTE — Telephone Encounter (Signed)
Msg from the pharmacy stating the Max simvastatin dose recommended with Diltiazem is 10 mg. Please verify it is ok to dispense simvastin 20 mg and her Diltiazem. Per Dr.Hopper he is going to change the Simvastatin to 10 mg. Rx faxed      KP

## 2012-07-01 NOTE — Progress Notes (Signed)
  Subjective:    Patient ID: Valerie Decker, female    DOB: 09-Jan-1931, 78 y.o.   MRN: [id number]  HPI Dyslipidemia assessment: Prior Advanced Lipid Testing: NMR LDL goal = < 100, ideally < 70.   Family history of premature CAD/ MI: no .  Nutrition: heart healthy .  Exercise: no . Diabetes : no . HTN: controlled. Smoking history  : second hand .   Weight :  stable.  Lab results reviewed :LDL 152; HDL 51.6     Review of Systems Some fatigue . No chest pain  ;claudication; palpitations; abd pain  ; myalgias. Alternating bowel changes.     Objective:   Physical Exam She appears healthy and well-nourished;she is in no acute distress. Appears younger than stated age   No carotid bruits are present.  Heart rhythm and rate are normal with no significant murmurs or gallops.  Chest is clear with no increased work of breathing  There is no evidence of aortic aneurysm or renal artery bruits  She has no clubbing or edema.   Pedal pulses are intact   No ischemic skin changes are present       Assessment & Plan:

## 2012-07-01 NOTE — Patient Instructions (Addendum)
Please  schedule fasting Labs IN 10 WEEKS :CK,Lipids, hepatic panel. PLEASE BRING THESE INSTRUCTIONS TO FOLLOW UP  LAB APPOINTMENT.This will guarantee correct labs are drawn, eliminating need for repeat blood sampling ( needle sticks ! ). Diagnoses /Codes: 272.4,995.20. The most common cause of elevated triglycerides is the ingestion of sugar from high fructose corn syrup sources added to processed foods & drinks.  Eat a low-fat diet with lots of fruits and vegetables, up to 7-9 servings per day. Consume less than 30 (preferably ZERO) grams of sugar per day from foods & drinks with High Fructose Corn Syrup (HFCS) sugar as #1,2,3 or # 4 on label.Whole Foods, [employer] & [employer] do not carry products with HFCS. Follow a  low carb nutrition program such as [place] or [employer]  to prevent Diabetes . White carbohydrates (potatoes, rice, bread, and pasta) have a high spike of sugar and a high load of sugar. For example a  baked potato has a cup of sugar and a  french fry  2 teaspoons of sugar. Yams, wild  rice, whole grained bread &  wheat pasta have been much lower spike and load of  sugar. Portions should be the size of a deck of cards or your palm.    If you activate My Chart; the results can be released to you as soon as they populate from the lab. If you choose not to use this program; the labs have to be reviewed, copied & mailed   causing a delay in getting the results to you.  Please review the medication list in the After Visit Summary provided.Please write the name of the prescribing physician to the right of the medication and share this with all medical staff seen at each appointment. This will help provide continuity of care; help optimize therapeutic interventions;and help prevent drug:drug adverse reaction.

## 2012-07-02 ENCOUNTER — Other Ambulatory Visit: Payer: Self-pay

## 2012-07-02 NOTE — Telephone Encounter (Signed)
Spoke with patient, patient is very confused as to what dose of Simvastatin she should take, the OV not states 20 mg by the rx at the pharmacy is for 10 mg.  Hopp please clarify

## 2012-07-02 NOTE — Telephone Encounter (Signed)
Pt called [facility] stating she was in yesterday and given Rx by Hop and had questions about it. Pt requesting to be called. Plz advise   MW  [phone]

## 2012-07-02 NOTE — Telephone Encounter (Signed)
Patient aware and verbalized understanding of Dr.Hopper response

## 2012-07-02 NOTE — Telephone Encounter (Signed)
Dose decreased from 20 mg daily to one half or 10 mg to prevent any possible drug: Drug interaction. Recheck the fasting labs after 10 weeks of 10 mg of simvastatin at bedtime

## 2012-07-14 ENCOUNTER — Telehealth: Payer: Self-pay | Admitting: Internal Medicine

## 2012-07-14 NOTE — Telephone Encounter (Signed)
Patient states her pharmacy told her that pulmicort needed prior authorization. I do not see where we are working on anything for this med. Pt is requesting we get this started as soon as possible because she is having symptoms due to weather change. Pt uses [employer]

## 2012-07-14 NOTE — Telephone Encounter (Signed)
I called [phone] to begin prior auth process and provided all patient's information and was told [facility] does not have patient listed. There is a discrepancy that patient is going by 2 different last names Valerie Decker and Valerie Decker) .  I called patient to give her a status update  I called the pharmacy [facility]) back and informed them that the number they provided is not valid for patient. I was given alternative number [phone]   I called alternative number given, requested paper work for prior auth to be faxed to [phone], I was instructed paperwork to be faxed

## 2012-07-15 NOTE — Telephone Encounter (Signed)
Prior auth paperwork was received and completed by MD and faxed back

## 2012-07-15 NOTE — Telephone Encounter (Signed)
Paperwork received and faxed to [facility]: RX approved 06/24/2012 until 07/15/2015

## 2012-09-08 ENCOUNTER — Other Ambulatory Visit (INDEPENDENT_AMBULATORY_CARE_PROVIDER_SITE_OTHER): Payer: Medicare Other

## 2012-09-08 DIAGNOSIS — E785 Hyperlipidemia, unspecified: Secondary | ICD-10-CM

## 2012-09-08 DIAGNOSIS — T887XXA Unspecified adverse effect of drug or medicament, initial encounter: Secondary | ICD-10-CM

## 2012-09-08 LAB — HEPATIC FUNCTION PANEL
Alkaline Phosphatase: 56 U/L (ref 39–117)
Bilirubin, Direct: 0 mg/dL (ref 0.0–0.3)
Total Protein: 7.2 g/dL (ref 6.0–8.3)

## 2012-09-08 LAB — LIPID PANEL: VLDL: 20 mg/dL (ref 0.0–40.0)

## 2012-09-08 LAB — CK: Total CK: 40 U/L (ref 7–177)

## 2012-09-09 ENCOUNTER — Other Ambulatory Visit: Payer: Medicare Other

## 2012-09-15 ENCOUNTER — Ambulatory Visit (INDEPENDENT_AMBULATORY_CARE_PROVIDER_SITE_OTHER): Payer: Medicare Other | Admitting: Internal Medicine

## 2012-09-15 ENCOUNTER — Encounter: Payer: Self-pay | Admitting: Internal Medicine

## 2012-09-15 VITALS — BP 126/80 | HR 87 | Wt 139.2 lb

## 2012-09-15 DIAGNOSIS — R911 Solitary pulmonary nodule: Secondary | ICD-10-CM

## 2012-09-15 DIAGNOSIS — E782 Mixed hyperlipidemia: Secondary | ICD-10-CM

## 2012-09-15 DIAGNOSIS — J45909 Unspecified asthma, uncomplicated: Secondary | ICD-10-CM

## 2012-09-15 MED ORDER — SIMVASTATIN 10 MG PO TABS: 10.0000 mg | ORAL_TABLET | Freq: Every day | ORAL | Status: DC

## 2012-09-15 NOTE — Patient Instructions (Addendum)
If you activate My Chart; the results can be released to you as soon as they populate from the lab. If you choose not to use this program; the labs have to be reviewed, copied & mailed   causing a delay in getting the results to you. Review and correct the record as indicated. Please share record with all medical staff seen.  

## 2012-09-15 NOTE — Assessment & Plan Note (Signed)
Focus on TLC , specifically on CVE

## 2012-09-15 NOTE — Progress Notes (Signed)
  Subjective:    Patient ID: Valerie Decker, female    DOB: [birth year], 79 y.o.   MRN: [id number]  HPI #1 Pulmonary Nodule: She fell 01/04/12; CT of the cervical spine revealed an incidental 5 mm right apical nodule. She has never smoked but was exposed to second hand smoke for decades from her mother and subsequently from her husband.  #2 Dyslipidemia: Prior Advanced Lipid Testing  NMR documents LDL goal < 100  ; ideally < 70.   Family history of premature CAD/ MI not present.  Nutrition plan described as heart healthy low . No regular Exercise . No PMH of Diabetes . HTN controlled with meds  Lipids 09/08/12 reviewed & risks discussed. LDL down from 152 to 119; 40% decrease in risks.     Review of Systems She has no constitutional symptoms of fever, chills, sweats , or weight loss.  Her asthma is triggered by seasonal allergies, and mildew, as well as or weather. She is on maintenance agents and Symbicort and Singulair. She uses her albuterol as needed.  No significant fatigue No chest pain  ;claudication; palpitations ;  PND No abd pain; alternating bowel changes  No myalgias No significant  lightheadedness;  syncope ; memory loss No skin,nail changes . Hair finer       Objective:   Physical Exam General appearance:good health ;well nourished; no acute distress or increased work of breathing is present.  No  lymphadenopathy about the head, neck, or axilla noted. Appears younger than stated age   Eyes: No conjunctival inflammation or lid edema is present. There is no scleral icterus.  Ears:  External ear exam shows no significant lesions or deformities.  Otoscopic examination reveals clear canals, tympanic membranes are intact bilaterally without bulging, retraction, inflammation or discharge.  Nose:  External nasal examination shows no deformity or inflammation. Nasal mucosa are pink and moist without lesions or exudates. No septal dislocation or deviation.No obstruction to  airflow.   Oral exam: Dental hygiene is good; lips and gums are healthy appearing.There is no oropharyngeal erythema or exudate noted. Uvulectomy  Neck:  No deformities, thyromegaly, masses, or tenderness noted.    Heart:  Normal rate and regular rhythm. S1 and S2 normal without gallop, murmur, click, rub or other extra sounds.   Lungs:Chest clear to auscultation; no wheezes, rhonchi,rales ,or rubs present.No increased work of breathing.    Extremities:  No cyanosis, edema, or clubbing  noted    All pulses intact without  bruits .No ischemic skin changes.     Skin: Warm & dry w/o jaundice or tenting.          Assessment & Plan:

## 2012-09-15 NOTE — Assessment & Plan Note (Signed)
Limited CT of chest as apical lordotic may not be diagnostic with such a small lesion

## 2012-09-18 ENCOUNTER — Other Ambulatory Visit: Payer: Self-pay | Admitting: Internal Medicine

## 2012-09-22 ENCOUNTER — Ambulatory Visit (INDEPENDENT_AMBULATORY_CARE_PROVIDER_SITE_OTHER)
Admission: RE | Admit: 2012-09-22 | Discharge: 2012-09-22 | Disposition: A | Discharge status: Home / Self Care | Payer: Medicare Other | Source: Ambulatory Visit | Attending: Internal Medicine | Admitting: Internal Medicine

## 2012-09-22 DIAGNOSIS — R911 Solitary pulmonary nodule: Secondary | ICD-10-CM

## 2012-11-13 ENCOUNTER — Other Ambulatory Visit: Payer: Self-pay | Admitting: Internal Medicine

## 2013-03-12 ENCOUNTER — Other Ambulatory Visit: Payer: Self-pay | Admitting: Internal Medicine

## 2013-03-12 NOTE — Telephone Encounter (Signed)
TSH 244.9 

## 2013-03-16 ENCOUNTER — Telehealth: Payer: Self-pay | Admitting: Gastroenterology

## 2013-03-16 NOTE — Telephone Encounter (Signed)
Pt with a hx of IBS and Diverticulosis; last ECL 11/14/2008 with EGD normal and COLON with benign biopsies. Pt reports 2 occasions where she has had numerous loose stools and she has to stay home. Pt states she hates to take an Imodium because she MAY get constipated; if constipated, she takes one dose of Miralax and she's OK again. When asked about her diet, she eating more fresh vegetables such as tomatoes, cukes and squash. We discussed diverticulitis and I informed her the problem is probably d/t her diet because with diverticulitis she would be having abdominal cramping, maybe a fever and maybe blood in her stool. I will ask Dr Jarold Motto, but 1/2 of an Imodium is a quick good cure considering her condition. Suggested fiber to firm her stools and she states she takes Citracal.

## 2013-03-22 NOTE — Telephone Encounter (Signed)
Informed pt of Dr Norval Gable suggestions. She states she has increased Citracal to BID and has increased fiber in her diet.

## 2013-03-24 ENCOUNTER — Other Ambulatory Visit: Payer: Self-pay

## 2013-03-24 ENCOUNTER — Other Ambulatory Visit: Payer: Self-pay | Admitting: Internal Medicine

## 2013-05-11 ENCOUNTER — Other Ambulatory Visit: Payer: Self-pay | Admitting: Internal Medicine

## 2013-05-12 NOTE — Telephone Encounter (Signed)
Med filled.  

## 2013-06-20 ENCOUNTER — Other Ambulatory Visit: Payer: Self-pay | Admitting: Internal Medicine

## 2013-06-21 NOTE — Telephone Encounter (Signed)
Levothyroxine refill sent to pharmacy 

## 2013-06-24 ENCOUNTER — Other Ambulatory Visit: Payer: Self-pay

## 2013-08-04 ENCOUNTER — Other Ambulatory Visit: Payer: Self-pay | Admitting: Internal Medicine

## 2013-08-04 NOTE — Telephone Encounter (Signed)
Diltiazem refilled per protocol. OV due. JG//CMA

## 2013-09-02 ENCOUNTER — Telehealth: Payer: Self-pay | Admitting: Internal Medicine

## 2013-09-02 NOTE — Telephone Encounter (Signed)
OK 

## 2013-09-02 NOTE — Telephone Encounter (Signed)
Pt would like to switch to dr kim. Dr hopper is retiring.Can I sch?

## 2013-09-02 NOTE — Telephone Encounter (Signed)
Dr. Linna Darner is ok with this.

## 2013-09-02 NOTE — Telephone Encounter (Signed)
Ok. Needs medicare new pt appt on and available monday at 11:15. Needs to follow up with old office for any acute issues until establishes here with new pt visit.

## 2013-09-03 NOTE — Telephone Encounter (Signed)
Pt is aware.  

## 2013-09-16 ENCOUNTER — Ambulatory Visit (INDEPENDENT_AMBULATORY_CARE_PROVIDER_SITE_OTHER): Payer: Medicare Other | Admitting: Internal Medicine

## 2013-09-16 ENCOUNTER — Encounter: Payer: Self-pay | Admitting: Internal Medicine

## 2013-09-16 VITALS — BP 185/80 | HR 72 | Temp 97.8°F | Wt 139.0 lb

## 2013-09-16 DIAGNOSIS — M899 Disorder of bone, unspecified: Secondary | ICD-10-CM

## 2013-09-16 DIAGNOSIS — M79609 Pain in unspecified limb: Secondary | ICD-10-CM

## 2013-09-16 DIAGNOSIS — M949 Disorder of cartilage, unspecified: Secondary | ICD-10-CM

## 2013-09-16 DIAGNOSIS — M898X6 Other specified disorders of bone, lower leg: Secondary | ICD-10-CM

## 2013-09-16 DIAGNOSIS — M79662 Pain in left lower leg: Secondary | ICD-10-CM

## 2013-09-16 NOTE — Patient Instructions (Signed)
I recommend an  Orthopedics  consultation to determine optimal therapy

## 2013-09-16 NOTE — Progress Notes (Signed)
   Subjective:    Patient ID: Valerie Decker, female    DOB: November 26, 1930, 80 y.o.   MRN: [id number]  HPI  She experienced sudden onset of pain at the left medial knee getting out of her car last night. No "pop" felt or heard. The pain was increased with ambulation.  There was no trauma or other trigger; but she has noted tenderness at this area upon awakening the last several days.  With walking the pain can be at least a level X. It has been associated with swelling . It does improve when she sits  Tylenol Extra Strength was of no benefit  Remotely she did have blunt trauma to this knee in a fall. Apparently she was given an intra-articular steroid injection.  She believes her bone density has revealed osteopenia. No bone mineral density study is found in the electronic medical record.     Review of Systems She denies any associated fever, chills, sweats, or weight loss  There's been no rash or skin color or temperature  changes  in the area the pain.      Objective:   Physical Exam  She appears healthy and well-nourished in no acute distress. She is uncomfortable. She is in a wheelchair.  There is pain to palpation over the fibular head below the right medial knee. There is visible swelling. There is no definite ecchymosis present. There is pain with lateral rotation of the ankle/foot at the fibular head.  No effusion is present @ the left knee. There is no tenderness to palpation in the popliteal space  Pedal pulses are intact.        Assessment & Plan:  #1 pain at the fibular head left lower extremity rule out pathologic fracture  Plan see orders.

## 2013-09-16 NOTE — Progress Notes (Signed)
Pre visit review using our clinic review tool, if applicable. No additional management support is needed unless otherwise documented below in the visit note. 

## 2013-09-17 ENCOUNTER — Other Ambulatory Visit: Payer: Self-pay | Admitting: Internal Medicine

## 2013-09-17 NOTE — Telephone Encounter (Signed)
Levothyroxine refilled per protocol. JG//CMA 

## 2013-09-21 ENCOUNTER — Other Ambulatory Visit: Payer: Self-pay | Admitting: Internal Medicine

## 2013-09-22 NOTE — Telephone Encounter (Signed)
Lisinopril and Simvastatin refilled per protocol. JG//CMA

## 2013-11-09 ENCOUNTER — Other Ambulatory Visit: Payer: Self-pay | Admitting: Internal Medicine

## 2013-12-06 ENCOUNTER — Ambulatory Visit: Payer: Medicare Other | Admitting: Family Medicine

## 2013-12-14 ENCOUNTER — Ambulatory Visit: Payer: Medicare Other | Admitting: Family Medicine

## 2013-12-22 ENCOUNTER — Encounter: Payer: Self-pay | Admitting: Family Medicine

## 2013-12-22 ENCOUNTER — Telehealth: Payer: Self-pay | Admitting: Family Medicine

## 2013-12-22 ENCOUNTER — Ambulatory Visit (INDEPENDENT_AMBULATORY_CARE_PROVIDER_SITE_OTHER): Payer: Medicare Other | Admitting: Family Medicine

## 2013-12-22 VITALS — BP 165/93 | HR 95 | Temp 99.6°F | Ht 64.0 in | Wt 135.0 lb

## 2013-12-22 DIAGNOSIS — H109 Unspecified conjunctivitis: Secondary | ICD-10-CM

## 2013-12-22 MED ORDER — TOBRAMYCIN-DEXAMETHASONE 0.3-0.1 % OP SUSP: 2.0000 [drp] | OPHTHALMIC | Status: DC

## 2013-12-22 NOTE — Telephone Encounter (Signed)
Patient Information:  Caller Name: Valerie Decker  Phone: [phone]  Patient: Valerie Decker, Valerie Decker  Gender: Female  DOB: 1931/04/14  Age: 80 Years  PCP: Maudie Mercury (TEXT 1st, after 20 mins can call), [facility])  Office Follow Up:  Does the office need to follow up with this patient?: No  Instructions For The Office: N/A  RN Note:  Pt reports L eye redness w/o drng. Her eye was swollen but she put a cold compress on it on 12/17/13 which made the swelling go down. Eye is sore in the AM but feels better as the morning goes on but she feels a gritty sensation in it despite using Bepreve eye gtts.  Symptoms  Reason For Call & Symptoms: L eye redness  Reviewed Health History In EMR: Yes  Reviewed Medications In EMR: Yes  Reviewed Allergies In EMR: Yes  Reviewed Surgeries / Procedures: Yes  Date of Onset of Symptoms: 12/16/2013  Treatments Tried: Bepreve eye gtts per her eye doctor  Treatments Tried Worked: No  Guideline(s) Used:  Eye - Red Without Pus  Disposition Per Guideline:   Go to Office Now  Reason For Disposition Reached:   Foreign body sensation ("feels like something is in there")  Advice Given:  Call Back If:  You become worse.  Patient Will Follow Care Advice:  YES  Appointment Scheduled:  12/22/2013 14:00:00 Appointment Scheduled Provider:  Alysia Penna [facility])

## 2013-12-22 NOTE — Progress Notes (Signed)
   Subjective:    Patient ID: Thomasene Dubow, female    DOB: 08/05/1931, 80 y.o.   MRN: [id number]  HPI Here for 6 days of redness and burning in the left eye. No blurred vision or eye pain. No increased light sensitivity. No URI sx. She has been applying an antihistamine cream to the lids from Dr. Donneta Romberg, but this has not helped.    Review of Systems  Constitutional: Negative.   HENT: Negative.   Eyes: Positive for redness and itching. Negative for photophobia, pain, discharge and visual disturbance.  Respiratory: Negative.        Objective:   Physical Exam  Constitutional: She appears well-developed and well-nourished.  HENT:  Right Ear: External ear normal.  Left Ear: External ear normal.  Nose: Nose normal.  Mouth/Throat: Oropharynx is clear and moist.  Eyes: EOM are normal. Pupils are equal, round, and reactive to light.  Right eye is clear. Left conjunctiva is red, the lid edges are slightly swollen. The cornea is clear.           Assessment & Plan:  Treat with Tobradex drops and cool compresses. Recheck prn

## 2013-12-22 NOTE — Telephone Encounter (Signed)
Noted  

## 2013-12-22 NOTE — Progress Notes (Signed)
Pre visit review using our clinic review tool, if applicable. No additional management support is needed unless otherwise documented below in the visit note. 

## 2013-12-27 ENCOUNTER — Telehealth: Payer: Self-pay | Admitting: Family Medicine

## 2013-12-27 NOTE — Telephone Encounter (Signed)
Pt saw Dr Sarajane Jews and was rx eye drop  tobramycin-dexamethasone (TOBRADEX) ophthalmic solution    Pt would like to know how long she needs to continue to do the eye drops. Has been since Thurs.  pls advise

## 2013-12-27 NOTE — Telephone Encounter (Signed)
Per Dr. Sarajane Jews, should use eye drops until symptoms are gone. I spoke with pt and gave this information.

## 2014-06-10 ENCOUNTER — Emergency Department (HOSPITAL_COMMUNITY)
Admission: EM | Admit: 2014-06-10 | Discharge: 2014-06-10 | Disposition: A | Discharge status: Home / Self Care | Payer: Medicare Other | Attending: Emergency Medicine | Admitting: Emergency Medicine

## 2014-06-10 ENCOUNTER — Encounter (HOSPITAL_COMMUNITY): Payer: Self-pay | Admitting: Emergency Medicine

## 2014-06-10 DIAGNOSIS — Z79899 Other long term (current) drug therapy: Secondary | ICD-10-CM | POA: Diagnosis not present

## 2014-06-10 DIAGNOSIS — Z8669 Personal history of other diseases of the nervous system and sense organs: Secondary | ICD-10-CM | POA: Insufficient documentation

## 2014-06-10 DIAGNOSIS — J45909 Unspecified asthma, uncomplicated: Secondary | ICD-10-CM | POA: Diagnosis not present

## 2014-06-10 DIAGNOSIS — E039 Hypothyroidism, unspecified: Secondary | ICD-10-CM | POA: Insufficient documentation

## 2014-06-10 DIAGNOSIS — Z8659 Personal history of other mental and behavioral disorders: Secondary | ICD-10-CM | POA: Diagnosis not present

## 2014-06-10 DIAGNOSIS — M5431 Sciatica, right side: Secondary | ICD-10-CM | POA: Insufficient documentation

## 2014-06-10 DIAGNOSIS — I10 Essential (primary) hypertension: Secondary | ICD-10-CM | POA: Insufficient documentation

## 2014-06-10 DIAGNOSIS — Z7952 Long term (current) use of systemic steroids: Secondary | ICD-10-CM | POA: Diagnosis not present

## 2014-06-10 DIAGNOSIS — Z7951 Long term (current) use of inhaled steroids: Secondary | ICD-10-CM | POA: Diagnosis not present

## 2014-06-10 DIAGNOSIS — K219 Gastro-esophageal reflux disease without esophagitis: Secondary | ICD-10-CM | POA: Insufficient documentation

## 2014-06-10 DIAGNOSIS — E782 Mixed hyperlipidemia: Secondary | ICD-10-CM | POA: Diagnosis not present

## 2014-06-10 DIAGNOSIS — M8580 Other specified disorders of bone density and structure, unspecified site: Secondary | ICD-10-CM | POA: Insufficient documentation

## 2014-06-10 DIAGNOSIS — M549 Dorsalgia, unspecified: Secondary | ICD-10-CM | POA: Diagnosis present

## 2014-06-10 MED ORDER — DIAZEPAM 2 MG PO TABS: 5.0000 mg | ORAL_TABLET | Freq: Two times a day (BID) | ORAL | Status: DC

## 2014-06-10 MED ORDER — DIAZEPAM 5 MG PO TABS: 5.0000 mg | ORAL_TABLET | Freq: Two times a day (BID) | ORAL | Status: DC

## 2014-06-10 MED ORDER — ACETAMINOPHEN-CODEINE #3 300-30 MG PO TABS
1.0000 | ORAL_TABLET | Freq: Four times a day (QID) | ORAL | Status: DC | PRN
Start: 2014-06-10 — End: 2016-01-30

## 2014-06-10 MED ORDER — DIAZEPAM 2 MG PO TABS: 2.0000 mg | ORAL_TABLET | Freq: Two times a day (BID) | ORAL | Status: DC

## 2014-06-10 MED ORDER — IBUPROFEN 600 MG PO TABS: 600.0000 mg | ORAL_TABLET | Freq: Three times a day (TID) | ORAL | Status: DC | PRN

## 2014-06-10 NOTE — ED Provider Notes (Signed)
CSN: [id number]     Arrival date & time 06/10/14  1659 History  This chart was scribed for non-physician practitioner, Etta Quill, NP-C working with Evelina Bucy, MD by Einar Pheasant, ED scribe. This patient was seen in room TR11C/TR11C and the patient's care was started at 5:58 PM.    Chief Complaint  Patient presents with  . Back Pain   Patient is a 80 y.o. female presenting with back pain. The history is provided by the patient. No language interpreter was used.  Back Pain Location:  Gluteal region Quality:  Aching Radiates to:  R posterior upper leg Pain severity:  Moderate Onset quality:  Gradual Duration:  1 week Timing:  Constant Progression:  Worsening Chronicity:  New Context: physical stress   Context: not emotional stress, not falling, not lifting heavy objects, not MVA, not occupational injury and not recent illness   Relieved by:  Nothing Exacerbated by: laying in bed. Ineffective treatments:  Bed rest and being still Associated symptoms: leg pain   Associated symptoms: no abdominal pain, no bladder incontinence, no bowel incontinence, no chest pain, no dysuria, no fever, no headaches, no pelvic pain, no perianal numbness, no tingling, no weakness and no weight loss    HPI Comments: Valerie Decker is a 80 y.o. female with a hx of back pain in 2008 presents to the Emergency Department complaining of gradual onset worsening sciatica that started approximately 1 week ago. Pt states that the lower back pain radiates down into right leg. She states that the pain started after making her bed last week Friday. Pt states that the pain starts at her buttock and radiates down to her right knee. She states that the pain is exacerbated by lying down for long periods of time. Denies any fecal incontinence, bladder incontinence, numbness, weakness, fever, chills, nausea, emesis, or dysuria. Pt takes BP and cholesterol medication daily.   Past Medical History  Diagnosis Date  .  Osteopenia   . Allergic rhinitis, cause unspecified   . Esophageal reflux   . Irritable bowel syndrome   . Mixed hyperlipidemia   . Unspecified sleep apnea     marginal; intolerant to CPAP  . Unspecified essential hypertension   . Unspecified hypothyroidism   . Neuralgia, neuritis, and radiculitis, unspecified   . Carpal tunnel syndrome   . Unspecified asthma(493.90)   . Diverticulosis    Past Surgical History  Procedure Laterality Date  . Facial plastic surgery    . Tonsillectomy and adenoidectomy      ? uvulectomy  . Colonoscopy      Tics   Family History  Problem Relation Age of Onset  . Heart failure Mother     liver disease  . Stroke Maternal Aunt   . COPD Maternal Aunt   . Lung cancer Maternal Aunt   . Asthma Maternal Grandfather    History  Substance Use Topics  . Smoking status: Never Smoker   . Smokeless tobacco: Never Used     Comment: Second hand smoke from mother & husband for decades  . Alcohol Use: 4.2 oz/week    7 Glasses of wine per week   OB History   Grav Para Term Preterm Abortions TAB SAB Ect Mult Living                 Review of Systems  Constitutional: Negative for fever, chills, weight loss, diaphoresis, appetite change and fatigue.  HENT: Negative for mouth sores, sore throat and trouble swallowing.   Eyes:  Negative for visual disturbance.  Respiratory: Negative for cough, chest tightness, shortness of breath and wheezing.   Cardiovascular: Negative for chest pain.  Gastrointestinal: Negative for nausea, vomiting, abdominal pain, diarrhea, abdominal distention and bowel incontinence.  Endocrine: Negative for polydipsia, polyphagia and polyuria.  Genitourinary: Negative for bladder incontinence, dysuria, frequency, hematuria and pelvic pain.  Musculoskeletal: Positive for back pain. Negative for gait problem.  Skin: Negative for color change, pallor and rash.  Neurological: Negative for dizziness, tingling, syncope, weakness,  light-headedness and headaches.  Hematological: Does not bruise/bleed easily.  Psychiatric/Behavioral: Negative for behavioral problems and confusion.  All other systems reviewed and are negative.     Allergies  Amlodipine besylate; Atorvastatin; and Niacin  Home Medications   Prior to Admission medications   Medication Sig Start Date End Date Taking? Authorizing Provider  albuterol (PROAIR HFA) 108 (90 BASE) MCG/ACT inhaler Inhale 2 puffs into the lungs as needed.      Historical Provider, MD  budesonide (PULMICORT) 0.5 MG/2ML nebulizer solution Bid as needed 07/01/12   Hendricks Limes, MD  Budesonide-Formoterol Fumarate (SYMBICORT IN) Inhale 2 puffs into the lungs 2 (two) times daily. Symbicort-strength unknown     Historical Provider, MD  Calcium Carbonate (CALCIUM 600 PO) Take by mouth. 1/2 by mouth two times daily      Historical Provider, MD  Coenzyme Q10 (COQ10) 100 MG CAPS Take by mouth daily.      Historical Provider, MD  Cyanocobalamin (B-12) 1000 MCG CAPS Take by mouth daily. Chewable    Historical Provider, MD  diltiazem (CARDIZEM CD) 120 MG 24 hr capsule TAKE 1 CAPSULE EVERY DAY. 11/09/13   Hendricks Limes, MD  fluticasone [facility]) 50 MCG/ACT nasal spray Place 2 sprays into the nose 2 (two) times daily.     Historical Provider, MD  levalbuterol Penne Lash) 1.25 MG/3ML nebulizer solution Take 1 ampule by nebulization 2 (two) times daily.      Historical Provider, MD  levocetirizine (XYZAL) 5 MG tablet Take 5 mg by mouth every evening.      Historical Provider, MD  levothyroxine (SYNTHROID, LEVOTHROID) 25 MCG tablet TAKE 1 TABLET ONCE DAILY. 09/17/13   Hendricks Limes, MD  lisinopril (PRINIVIL,ZESTRIL) 10 MG tablet TAKE 1 TABLET ONCE DAILY. 09/21/13   Hendricks Limes, MD  montelukast (SINGULAIR) 10 MG tablet Take 10 mg by mouth at bedtime.    Historical Provider, MD  simvastatin (ZOCOR) 10 MG tablet TAKE ONE TABLET AT BEDTIME. 09/21/13   Hendricks Limes, MD   tobramycin-dexamethasone [facility]) ophthalmic solution Place 2 drops into the left eye every 4 (four) hours while awake. 12/22/13   Laurey Morale, MD   Triage Vitals: BP 162/86  Pulse 89  Temp(Src) 97.9 F (36.6 C)  Resp 18  Wt 135 lb 7 oz (61.434 kg)  SpO2 98%  Physical Exam  Nursing note and vitals reviewed. Constitutional: She appears well-developed and well-nourished. No distress.  HENT:  Head: Normocephalic and atraumatic.  Eyes: Conjunctivae are normal. Right eye exhibits no discharge. Left eye exhibits no discharge.  Neck: Normal range of motion. Neck supple.  Cardiovascular: Normal rate, regular rhythm, normal heart sounds and intact distal pulses.  Exam reveals no gallop and no friction rub.   No murmur heard. Pedal pulses normal.  Pulmonary/Chest: Effort normal and breath sounds normal. No respiratory distress.  Abdominal: Soft. Bowel sounds are normal. She exhibits no distension and no mass. There is no tenderness.  Musculoskeletal: Normal range of motion. She exhibits no  edema and no tenderness.       Lumbar back: She exhibits no swelling, no edema and no spasm.  Pain in buttock that runs down her right thigh, stopping above her right knee. No cervical, thoracic, or lumbar tenderness.   Neurological: She is alert. She has normal strength. She displays no atrophy and no tremor. No sensory deficit. Gait normal.  Reflex Scores:      Patellar reflexes are 2+ on the right side and 2+ on the left side.      Achilles reflexes are 2+ on the right side and 2+ on the left side. No strength deficit noted in hip and knee flexor and extensor muscle groups.  Ankle flexion and extension intact.  Skin: Skin is warm and dry.  Psychiatric: She has a normal mood and affect. Her behavior is normal. Thought content normal.    ED Course  Procedures (including critical care time)  DIAGNOSTIC STUDIES: Oxygen Saturation is 98% on RA, normal by my interpretation.    COORDINATION OF  CARE: 6:02 PM- Will prescribe antiinflammatories and muscle relaxants. Pt advised of plan for treatment and pt agrees.  Labs Review Labs Reviewed - No data to display  Imaging Review No results found.   EKG Interpretation None     Patient discussed with and seen by Dr. Mingo Amber. MDM   Final diagnoses:  None    Sciatica, right, without back pain. Anti-inflammatory, muscle relaxant, PCP follow-up.  I personally performed the services described in this documentation, which was scribed in my presence. The recorded information has been reviewed and is accurate.    Norman Herrlich, NP 06/11/14 [phone]

## 2014-06-10 NOTE — Discharge Instructions (Signed)
Sciatica °Sciatica is pain, weakness, numbness, or tingling along your sciatic nerve. The nerve starts in the lower back and runs down the back of each leg. Nerve damage or certain conditions pinch or put pressure on the sciatic nerve. This causes the pain, weakness, and other discomforts of sciatica. °HOME CARE  °· Only take medicine as told by your doctor. °· Apply ice to the affected area for 20 minutes. Do this 3-4 times a day for the first 48-72 hours. Then try heat in the same way. °· Exercise, stretch, or do your usual activities if these do not make your pain worse. °· Go to physical therapy as told by your doctor. °· Keep all doctor visits as told. °· Do not wear high heels or shoes that are not supportive. °· Get a firm mattress if your mattress is too soft to lessen pain and discomfort. °GET HELP RIGHT AWAY IF:  °· You cannot control when you poop (bowel movement) or pee (urinate). °· You have more weakness in your lower back, lower belly (pelvis), butt (buttocks), or legs. °· You have redness or puffiness (swelling) of your back. °· You have a burning feeling when you pee. °· You have pain that gets worse when you lie down. °· You have pain that wakes you from your sleep. °· Your pain is worse than past pain. °· Your pain lasts longer than 4 weeks. °· You are suddenly losing weight without reason. °MAKE SURE YOU:  °· Understand these instructions. °· Will watch this condition. °· Will get help right away if you are not doing well or get worse. °Document Released: 05/14/2008 Document Revised: 02/04/2012 Document Reviewed: 12/15/2011 °[employer]® Patient Information ©2015 [employer]. This information is not intended to replace advice given to you by your health care provider. Make sure you discuss any questions you have with your health care provider. ° °

## 2014-06-10 NOTE — ED Notes (Signed)
Presents with lower back pain with radiation into right leg, felt like sciaticca last Friday after making up her bed at home. TOday still having back pain and right upper leg pain-reports that she has a torn meniscsus that began 6 months ago and is still hurting as well.  Pain is worse with movement

## 2014-06-11 NOTE — ED Provider Notes (Signed)
Medical screening examination/treatment/procedure(s) were conducted as a shared visit with non-physician practitioner(s) and myself.  I personally evaluated the patient during the encounter.   EKG Interpretation None      Older female here with R buttock pain, radiating down R leg. No falls or trauma. Progressively worsening over 1 week. No neurologic symptoms or deficits. No concern for cord compression.  Evelina Bucy, MD 06/11/14 [phone]

## 2014-06-20 ENCOUNTER — Ambulatory Visit: Payer: Medicare Other | Admitting: Family Medicine

## 2014-07-01 ENCOUNTER — Telehealth: Payer: Self-pay

## 2014-07-01 NOTE — Telephone Encounter (Signed)
Pt had an appt scheduled for 06/20/14.  Pt canceled, needs to be rescheduled for AWV.

## 2014-07-11 NOTE — Telephone Encounter (Signed)
Dr Maudie Mercury does not do AWV's.

## 2014-10-28 ENCOUNTER — Other Ambulatory Visit: Payer: Self-pay | Admitting: Internal Medicine

## 2015-02-13 ENCOUNTER — Other Ambulatory Visit: Payer: Self-pay

## 2015-08-23 ENCOUNTER — Encounter: Payer: Self-pay | Admitting: Gastroenterology

## 2015-08-30 ENCOUNTER — Other Ambulatory Visit: Payer: Self-pay | Admitting: Family Medicine

## 2015-08-30 DIAGNOSIS — I6529 Occlusion and stenosis of unspecified carotid artery: Secondary | ICD-10-CM

## 2015-09-01 ENCOUNTER — Other Ambulatory Visit: Payer: Self-pay

## 2015-09-13 ENCOUNTER — Ambulatory Visit
Admission: RE | Admit: 2015-09-13 | Discharge: 2015-09-13 | Disposition: A | Discharge status: Home / Self Care | Payer: Medicare Other | Source: Ambulatory Visit | Attending: Family Medicine | Admitting: Family Medicine

## 2015-09-13 DIAGNOSIS — I6529 Occlusion and stenosis of unspecified carotid artery: Secondary | ICD-10-CM

## 2015-09-17 ENCOUNTER — Emergency Department (HOSPITAL_COMMUNITY)
Admission: EM | Admit: 2015-09-17 | Discharge: 2015-09-17 | Disposition: A | Discharge status: Home / Self Care | Payer: Medicare Other | Attending: Emergency Medicine | Admitting: Emergency Medicine

## 2015-09-17 ENCOUNTER — Encounter (HOSPITAL_COMMUNITY): Payer: Self-pay | Admitting: Emergency Medicine

## 2015-09-17 DIAGNOSIS — I10 Essential (primary) hypertension: Secondary | ICD-10-CM | POA: Diagnosis not present

## 2015-09-17 DIAGNOSIS — Z8739 Personal history of other diseases of the musculoskeletal system and connective tissue: Secondary | ICD-10-CM | POA: Insufficient documentation

## 2015-09-17 DIAGNOSIS — E782 Mixed hyperlipidemia: Secondary | ICD-10-CM | POA: Diagnosis not present

## 2015-09-17 DIAGNOSIS — Z79899 Other long term (current) drug therapy: Secondary | ICD-10-CM | POA: Insufficient documentation

## 2015-09-17 DIAGNOSIS — J45909 Unspecified asthma, uncomplicated: Secondary | ICD-10-CM | POA: Diagnosis not present

## 2015-09-17 DIAGNOSIS — Z8719 Personal history of other diseases of the digestive system: Secondary | ICD-10-CM | POA: Insufficient documentation

## 2015-09-17 DIAGNOSIS — Z8669 Personal history of other diseases of the nervous system and sense organs: Secondary | ICD-10-CM | POA: Insufficient documentation

## 2015-09-17 DIAGNOSIS — Z7951 Long term (current) use of inhaled steroids: Secondary | ICD-10-CM | POA: Insufficient documentation

## 2015-09-17 DIAGNOSIS — E039 Hypothyroidism, unspecified: Secondary | ICD-10-CM | POA: Diagnosis not present

## 2015-09-17 DIAGNOSIS — Z7952 Long term (current) use of systemic steroids: Secondary | ICD-10-CM | POA: Insufficient documentation

## 2015-09-17 DIAGNOSIS — R040 Epistaxis: Secondary | ICD-10-CM | POA: Diagnosis not present

## 2015-09-17 NOTE — Discharge Instructions (Signed)

## 2015-09-17 NOTE — ED Notes (Signed)
Pt from home with c/o spitting up blood since last night.  Pt reports a nose bleed x 3 weeks ago.  Denies any other acute c/o.  Headache x 3 weeks and is being seen for the same, had recent tests done related to headache.  NAD, A&O.

## 2015-09-17 NOTE — ED Provider Notes (Signed)
CSN: [id number]     Arrival date & time 09/17/15  1221 History   First MD Initiated Contact with Patient 09/17/15 1244     Chief Complaint  Patient presents with  . Blood in the back of throat      (Consider location/radiation/quality/duration/timing/severity/associated sxs/prior Treatment) Patient is a 82 y.o. female presenting with nosebleeds. The history is provided by the patient.  Epistaxis Location:  Unable to specify Severity:  Mild Duration:  1 day Timing:  Rare Progression:  Resolved Chronicity:  New Context: not anticoagulants and not aspirin use   Relieved by:  Nothing Worsened by:  Nothing tried Ineffective treatments:  None tried Associated symptoms: blood in oropharynx (dark)   Associated symptoms: no congestion, no cough, no dizziness, no facial pain, no fever and no syncope   Risk factors: intranasal steroids     Past Medical History  Diagnosis Date  . Osteopenia   . Allergic rhinitis, cause unspecified   . Esophageal reflux   . Irritable bowel syndrome   . Mixed hyperlipidemia   . Unspecified sleep apnea     marginal; intolerant to CPAP  . Unspecified essential hypertension   . Unspecified hypothyroidism   . Neuralgia, neuritis, and radiculitis, unspecified   . Carpal tunnel syndrome   . Unspecified asthma(493.90)   . Diverticulosis    Past Surgical History  Procedure Laterality Date  . Facial plastic surgery    . Tonsillectomy and adenoidectomy      ? uvulectomy  . Colonoscopy      Tics   Family History  Problem Relation Age of Onset  . Heart failure Mother     liver disease  . Stroke Maternal Aunt   . COPD Maternal Aunt   . Lung cancer Maternal Aunt   . Asthma Maternal Grandfather    Social History  Substance Use Topics  . Smoking status: Never Smoker   . Smokeless tobacco: Never Used     Comment: Second hand smoke from mother & husband for decades  . Alcohol Use: 4.2 oz/week    7 Glasses of wine per week   OB History    No data  available     Review of Systems  Constitutional: Negative for fever.  HENT: Positive for nosebleeds. Negative for congestion.   Respiratory: Negative for cough.   Cardiovascular: Negative for syncope.  Neurological: Negative for dizziness.  All other systems reviewed and are negative.     Allergies  Amlodipine besylate; Atorvastatin; and Niacin  Home Medications   Prior to Admission medications   Medication Sig Start Date End Date Taking? Authorizing Provider  acetaminophen-codeine (TYLENOL #3) 300-30 MG per tablet Take 1 tablet by mouth every 6 (six) hours as needed for moderate pain or severe pain. 06/10/14   Etta Quill, NP  albuterol (PROAIR HFA) 108 (90 BASE) MCG/ACT inhaler Inhale 2 puffs into the lungs as needed.      Historical Provider, MD  budesonide (PULMICORT) 0.5 MG/2ML nebulizer solution Bid as needed 07/01/12   Hendricks Limes, MD  Budesonide-Formoterol Fumarate (SYMBICORT IN) Inhale 2 puffs into the lungs 2 (two) times daily. Symbicort-strength unknown     Historical Provider, MD  Calcium Carbonate (CALCIUM 600 PO) Take by mouth. 1/2 by mouth two times daily      Historical Provider, MD  citalopram (CELEXA) 20 MG tablet Take 20 mg by mouth daily. 05/19/14   Historical Provider, MD  Coenzyme Q10 (COQ10) 100 MG CAPS Take by mouth daily.  Historical Provider, MD  Cyanocobalamin (B-12) 1000 MCG CAPS Take by mouth daily. Chewable    Historical Provider, MD  diazepam (VALIUM) 2 MG tablet Take 1 tablet (2 mg total) by mouth 2 (two) times daily. 06/10/14   Etta Quill, NP  diltiazem (CARDIZEM CD) 120 MG 24 hr capsule TAKE 1 CAPSULE EVERY DAY. 11/09/13   Hendricks Limes, MD  fluticasone [facility]) 50 MCG/ACT nasal spray Place 2 sprays into the nose 2 (two) times daily.     Historical Provider, MD  ibuprofen (ADVIL,MOTRIN) 600 MG tablet Take 1 tablet (600 mg total) by mouth every 8 (eight) hours as needed. 06/10/14   Etta Quill, NP  levalbuterol Penne Lash) 1.25 MG/3ML  nebulizer solution Take 1 ampule by nebulization 2 (two) times daily.      Historical Provider, MD  levocetirizine (XYZAL) 5 MG tablet Take 5 mg by mouth every evening.      Historical Provider, MD  levothyroxine (SYNTHROID, LEVOTHROID) 25 MCG tablet TAKE 1 TABLET ONCE DAILY. 09/17/13   Hendricks Limes, MD  lisinopril (PRINIVIL,ZESTRIL) 10 MG tablet TAKE 1 TABLET ONCE DAILY. 09/21/13   Hendricks Limes, MD  montelukast (SINGULAIR) 10 MG tablet Take 10 mg by mouth at bedtime.    Historical Provider, MD  simvastatin (ZOCOR) 10 MG tablet TAKE ONE TABLET AT BEDTIME. 09/21/13   Hendricks Limes, MD  tobramycin-dexamethasone [facility]) ophthalmic solution Place 2 drops into the left eye every 4 (four) hours while awake. 12/22/13   Laurey Morale, MD  traZODone (DESYREL) 50 MG tablet Take 50 mg by mouth at bedtime. 05/19/14   Historical Provider, MD   BP 174/78 mmHg  Pulse 93  Temp(Src) 97.6 F (36.4 C) (Oral)  Resp 16  Ht 5\' 4"  (1.626 m)  Wt 139 lb 1 oz (63.078 kg)  BMI 23.86 kg/m2  SpO2 96% Physical Exam  Constitutional: She is oriented to person, place, and time. She appears well-developed and well-nourished. No distress.  HENT:  Head: Normocephalic.  Nose: Nose normal.  Mouth/Throat: Oropharynx is clear and moist. No oropharyngeal exudate.  Eyes: Conjunctivae are normal.  Neck: Neck supple. No tracheal deviation present.  Cardiovascular: Normal rate, regular rhythm and normal heart sounds.   Pulmonary/Chest: Effort normal and breath sounds normal. No respiratory distress.  Abdominal: Soft. She exhibits no distension.  Neurological: She is alert and oriented to person, place, and time.  Skin: Skin is warm and dry.  Psychiatric: She has a normal mood and affect.    ED Course  Procedures (including critical care time) Labs Review Labs Reviewed - No data to display  Imaging Review No results found. I have personally reviewed and evaluated these images and lab results as part of my medical  decision-making.   EKG Interpretation None      MDM   Final diagnoses:  Epistaxis, recurrent    82 y.o. female presents with epistaxis that resolved spontaneously 3 weeks ago. Now with intermittent small volume dark blood draining back from nose. Likely 2/2 passage of clots or mucosal irritation. Recommended stopping nasal steroid until able to f/u with PCP to gauge response. No indication of acute bleeding or anemia today. Plan to follow up with PCP as needed and return precautions discussed for worsening or new concerning symptoms.     Leo Grosser, MD 09/18/15 [phone]

## 2015-11-16 ENCOUNTER — Other Ambulatory Visit: Payer: Self-pay | Admitting: Rheumatology

## 2015-11-16 DIAGNOSIS — M542 Cervicalgia: Secondary | ICD-10-CM

## 2015-11-16 DIAGNOSIS — M5410 Radiculopathy, site unspecified: Secondary | ICD-10-CM

## 2015-11-22 ENCOUNTER — Other Ambulatory Visit: Payer: Medicare Other

## 2015-11-29 ENCOUNTER — Ambulatory Visit
Admission: RE | Admit: 2015-11-29 | Discharge: 2015-11-29 | Disposition: A | Discharge status: Home / Self Care | Payer: Medicare Other | Source: Ambulatory Visit | Attending: Rheumatology | Admitting: Rheumatology

## 2015-11-29 DIAGNOSIS — M542 Cervicalgia: Secondary | ICD-10-CM

## 2015-11-29 DIAGNOSIS — M5410 Radiculopathy, site unspecified: Secondary | ICD-10-CM

## 2015-12-06 ENCOUNTER — Other Ambulatory Visit: Payer: Self-pay | Admitting: Rheumatology

## 2015-12-06 DIAGNOSIS — M542 Cervicalgia: Principal | ICD-10-CM

## 2015-12-06 DIAGNOSIS — G8929 Other chronic pain: Secondary | ICD-10-CM

## 2015-12-15 ENCOUNTER — Ambulatory Visit
Admission: RE | Admit: 2015-12-15 | Discharge: 2015-12-15 | Disposition: A | Discharge status: Home / Self Care | Payer: Medicare Other | Source: Ambulatory Visit | Attending: Rheumatology | Admitting: Rheumatology

## 2015-12-15 DIAGNOSIS — G8929 Other chronic pain: Secondary | ICD-10-CM

## 2015-12-15 DIAGNOSIS — M542 Cervicalgia: Principal | ICD-10-CM

## 2015-12-15 MED ORDER — IOHEXOL 300 MG/ML  SOLN
1.0000 mL | Freq: Once | INTRAMUSCULAR | Status: AC | PRN
  Administered 2015-12-15: 1 mL via EPIDURAL

## 2015-12-15 MED ORDER — TRIAMCINOLONE ACETONIDE 40 MG/ML IJ SUSP (RADIOLOGY)
60.0000 mg | Freq: Once | INTRAMUSCULAR | Status: AC
  Administered 2015-12-15: 60 mg via EPIDURAL

## 2015-12-15 NOTE — Discharge Instructions (Signed)

## 2016-01-04 ENCOUNTER — Other Ambulatory Visit: Payer: Self-pay | Admitting: Rheumatology

## 2016-01-04 DIAGNOSIS — M4802 Spinal stenosis, cervical region: Secondary | ICD-10-CM

## 2016-01-05 ENCOUNTER — Encounter: Payer: Self-pay | Admitting: *Deleted

## 2016-01-17 ENCOUNTER — Ambulatory Visit
Admission: RE | Admit: 2016-01-17 | Discharge: 2016-01-17 | Disposition: A | Discharge status: Home / Self Care | Payer: Medicare Other | Source: Ambulatory Visit | Attending: Rheumatology | Admitting: Rheumatology

## 2016-01-17 DIAGNOSIS — M4802 Spinal stenosis, cervical region: Secondary | ICD-10-CM

## 2016-01-17 MED ORDER — IOHEXOL 300 MG/ML  SOLN
1.0000 mL | Freq: Once | INTRAMUSCULAR | Status: AC | PRN
  Administered 2016-01-17: 1 mL via EPIDURAL

## 2016-01-17 MED ORDER — TRIAMCINOLONE ACETONIDE 40 MG/ML IJ SUSP (RADIOLOGY)
60.0000 mg | Freq: Once | INTRAMUSCULAR | Status: AC
  Administered 2016-01-17: 60 mg via EPIDURAL

## 2016-01-30 ENCOUNTER — Encounter: Payer: Self-pay | Admitting: Internal Medicine

## 2016-01-30 ENCOUNTER — Ambulatory Visit (INDEPENDENT_AMBULATORY_CARE_PROVIDER_SITE_OTHER): Payer: Medicare Other | Admitting: Internal Medicine

## 2016-01-30 VITALS — BP 150/80 | HR 74 | Ht 64.0 in | Wt 133.0 lb

## 2016-01-30 DIAGNOSIS — K580 Irritable bowel syndrome with diarrhea: Secondary | ICD-10-CM | POA: Diagnosis not present

## 2016-01-30 MED ORDER — DICYCLOMINE HCL 10 MG PO CAPS
10.0000 mg | ORAL_CAPSULE | Freq: Three times a day (TID) | ORAL | Status: DC
Start: 2016-01-30 — End: 2016-04-19

## 2016-01-30 MED ORDER — RIFAXIMIN 550 MG PO TABS: 550.0000 mg | ORAL_TABLET | Freq: Three times a day (TID) | ORAL | Status: DC

## 2016-01-30 NOTE — Progress Notes (Signed)
Patient ID: Valerie Decker, female   DOB: Jul 02, 1931, 82 y.o.   MRN: [id number] HPI: Valerie Decker is an 82 year old female previously known to Dr. Sharlett Iles with long-standing irritable bowel with loose stools, diverticulosis, history of H. pylori status post treatment who is seen for the first time today to discuss her bowel movements. She is here alone today. She reports that she has had long-standing issues with alternating diarrhea and constipation. She reports that she is definitely loose stool and diarrhea predominant. Loose stools can occur 4-5 times per day throughout the day but not at night. It is often associated with lower abdominal cramping which she considers mild. No rectal bleeding, blood in her stool or melena. She tried a probiotic recommended by her husband and found "not much difference". She did take this consistently for about a month. She knows her trigger foods to be nuts and salads. Overall though with diet she reports her IBS is predictably unpredictable. After a while she will use Imodium which causes her to be constipated and that she then will use glycerin suppositories and MiraLAX. She states that she "needs a plan" to help her bowels. She denies upper abdominal pain. Denies heartburn, dysphagia and odynophagia. Reports a good appetite and a stable weight. Denies early satiety.  Her last colonoscopy was performed on 11/14/2008 by Dr. Verl Blalock. This revealed decreased rectal tone on DRE. The prep was excellent. There was mild diverticulosis in the sigmoid and descending colon but the exam was otherwise normal. Random biopsies were performed which were normal. Small bowel was also biopsied and normal without celiac disease.  Endoscopy was performed on the same day which was normal. Urease test was positive and she was treated for H. pylori.  Past Medical History  Diagnosis Date  . Osteopenia   . Allergic rhinitis, cause unspecified   . Esophageal reflux   . Irritable  bowel syndrome   . Mixed hyperlipidemia   . Unspecified sleep apnea     marginal; intolerant to CPAP  . Unspecified essential hypertension   . Unspecified hypothyroidism   . Neuralgia, neuritis, and radiculitis, unspecified   . Carpal tunnel syndrome   . Unspecified asthma(493.90)   . Diverticulosis   . Elevated serum homocysteine level   . H. pylori infection 2010    Past Surgical History  Procedure Laterality Date  . Facial plastic surgery    . Tonsillectomy and adenoidectomy      ? uvulectomy  . Colonoscopy      Tics  . Breast biopsy    . Dilation and curettage of uterus    . Uvulectomy      Outpatient Prescriptions Prior to Visit  Medication Sig Dispense Refill  . albuterol (PROAIR HFA) 108 (90 BASE) MCG/ACT inhaler Inhale 2 puffs into the lungs as needed.      . budesonide (PULMICORT) 0.5 MG/2ML nebulizer solution Bid as needed 60 mL 5  . Budesonide-Formoterol Fumarate (SYMBICORT IN) Inhale 2 puffs into the lungs 2 (two) times daily. Symbicort-strength unknown     . Calcium Carbonate (CALCIUM 600 PO) Take by mouth. 1/2 by mouth two times daily      . citalopram (CELEXA) 20 MG tablet Take 20 mg by mouth daily.    . Coenzyme Q10 (COQ10) 100 MG CAPS Take by mouth daily.      Marland Kitchen diltiazem (CARDIZEM CD) 120 MG 24 hr capsule TAKE 1 CAPSULE EVERY DAY. 90 capsule 3  . fluticasone (FLONASE) 50 MCG/ACT nasal spray Place 2 sprays into  the nose 2 (two) times daily.     Marland Kitchen ibuprofen (ADVIL,MOTRIN) 600 MG tablet Take 1 tablet (600 mg total) by mouth every 8 (eight) hours as needed. 21 tablet 0  . levalbuterol (XOPENEX) 1.25 MG/3ML nebulizer solution Take 1 ampule by nebulization 2 (two) times daily.      Marland Kitchen levothyroxine (SYNTHROID, LEVOTHROID) 25 MCG tablet TAKE 1 TABLET ONCE DAILY. 90 tablet 1  . lisinopril (PRINIVIL,ZESTRIL) 10 MG tablet TAKE 1 TABLET ONCE DAILY. 90 tablet 1  . montelukast (SINGULAIR) 10 MG tablet Take 10 mg by mouth at bedtime.    . simvastatin (ZOCOR) 10 MG tablet  TAKE ONE TABLET AT BEDTIME. 90 tablet 1  . acetaminophen-codeine (TYLENOL #3) 300-30 MG per tablet Take 1 tablet by mouth every 6 (six) hours as needed for moderate pain or severe pain. (Patient not taking: Reported on 01/30/2016) 12 tablet 0  . Cyanocobalamin (B-12) 1000 MCG CAPS Take by mouth daily. Reported on 01/30/2016    . diazepam (VALIUM) 2 MG tablet Take 1 tablet (2 mg total) by mouth 2 (two) times daily. (Patient not taking: Reported on 01/30/2016) 10 tablet 0  . levocetirizine (XYZAL) 5 MG tablet Take 5 mg by mouth every evening. Reported on 01/30/2016    . tobramycin-dexamethasone (TOBRADEX) ophthalmic solution Place 2 drops into the left eye every 4 (four) hours while awake. (Patient not taking: Reported on 01/30/2016) 10 mL 0  . traZODone (DESYREL) 50 MG tablet Take 50 mg by mouth at bedtime. Reported on 01/30/2016     No facility-administered medications prior to visit.    Allergies  Allergen Reactions  . Penicillins Diarrhea and Nausea And Vomiting    Family History  Problem Relation Age of Onset  . Heart failure Mother     liver disease  . Stroke Maternal Aunt   . COPD Maternal Aunt   . Lung cancer Maternal Aunt   . Asthma Maternal Grandfather   . Liver disease Mother   . Colon cancer Neg Hx     Social History  Substance Use Topics  . Smoking status: Never Smoker   . Smokeless tobacco: Never Used     Comment: Second hand smoke from mother & husband for decades  . Alcohol Use: No    ROS: As per history of present illness, otherwise negative  BP 150/80 mmHg  Pulse 74  Ht 5\' 4"  (1.626 m)  Wt 133 lb (60.328 kg)  BMI 22.82 kg/m2  SpO2 98% Constitutional: Well-developed and well-nourished. No distress. HEENT: Normocephalic and atraumatic.  Conjunctivae are normal.  No scleral icterus. Neck: Neck supple. Trachea midline. Cardiovascular: Normal rate, regular rhythm and intact distal pulses. No M/R/G Pulmonary/chest: Effort normal and breath sounds normal. No  wheezing, rales or rhonchi. Abdominal: Soft, nontender, nondistended. Bowel sounds active throughout. There are no masses palpable.  Extremities: no clubbing, cyanosis, or edema Lymphadenopathy: No cervical adenopathy noted. Neurological: Alert and oriented to person place and time. Skin: Skin is warm and dry. Psychiatric: Normal mood and affect. Behavior is normal.  ASSESSMENT/PLAN:  82 year old female previously known to Dr. Sharlett Iles with long-standing irritable bowel with loose stools, diverticulosis, history of H. pylori status post treatment who is seen for the first time today to discuss her bowel movements.  1. IBS with diarrhea/loose stools -- chronic irritable bowel over her lifetime. Unrevealing colonoscopy in 2010. No evidence for celiac disease. I'm going to give her a trial of rifaximin 550 mg 3 times a day 14 days for IBS-D. I've also  recommended Bentyl 10 mg 3 times a day on an as-needed basis when her stools are loose. This should help with the loose stools but also with the lower abdominal cramping which is mild. When she completes rifaximin therapy I have advised that she begin Benefiber 1 teaspoon daily. This should help bulk the stool and prevent both diarrhea and constipation. I have advised that she avoid Imodium as this results in constipation that she then treats with laxatives. I like to see her back in 2-3 months to assess her response to therapy and change treatment if necessary. She is happy with this plan. For now she will remain off probiotic.

## 2016-01-30 NOTE — Patient Instructions (Addendum)
We have sent your demographic information and a prescription for Xifaxan to [facility]. This pharmacy is able to get medication approved through insurance and get you the lowest copay possible. If you have not heard from them within 1 week, please call our office at [phone] to let us know.  We have sent the following medications to your pharmacy for you to pick up at your convenience: Bentyl 10 mg three times daily as needed on "loose stool days"  Once you have completed your Xifaxan course, please take Benefiber (over the counter) 1 teaspoon daily.  Follow up with Dr Hilarie Fredrickson on Friday, 04/19/16 at 2:30 pm.  If you are age 85 or older, your body mass index should be between 23-30. Your Body mass index is 22.82 kg/(m^2). If this is out of the aforementioned range listed, please consider follow up with your Primary Care Provider.  If you are age 31 or younger, your body mass index should be between 19-25. Your Body mass index is 22.82 kg/(m^2). If this is out of the aformentioned range listed, please consider follow up with your Primary Care Provider.

## 2016-02-01 ENCOUNTER — Telehealth: Payer: Self-pay | Admitting: Internal Medicine

## 2016-02-01 NOTE — Telephone Encounter (Signed)
Noted  

## 2016-02-02 ENCOUNTER — Telehealth: Payer: Self-pay | Admitting: Internal Medicine

## 2016-02-02 NOTE — Telephone Encounter (Signed)
Husband advised Rx would be coming from [facility]. He verbalizes understanding.

## 2016-02-08 ENCOUNTER — Telehealth: Payer: Self-pay | Admitting: Internal Medicine

## 2016-02-08 NOTE — Telephone Encounter (Signed)
Spoke with pt and let her know she can take miralax for constipation if she needs it.

## 2016-02-22 ENCOUNTER — Telehealth: Payer: Self-pay | Admitting: Internal Medicine

## 2016-02-22 NOTE — Telephone Encounter (Signed)
Spoke with pt and she states she will finish the xifaxan tomorrow morning. States she has continued to have multiple bm's while on the xifaxan. She thinks she has continued to loose weight. Pt has not taken any of the bentyl. Instructed pt to add the bentyl as that should help. Pt verbalized understanding.

## 2016-03-20 ENCOUNTER — Other Ambulatory Visit: Payer: Self-pay | Admitting: Rheumatology

## 2016-03-20 DIAGNOSIS — M542 Cervicalgia: Secondary | ICD-10-CM

## 2016-04-01 ENCOUNTER — Other Ambulatory Visit: Payer: Self-pay | Admitting: Rheumatology

## 2016-04-01 ENCOUNTER — Ambulatory Visit
Admission: RE | Admit: 2016-04-01 | Discharge: 2016-04-01 | Disposition: A | Discharge status: Home / Self Care | Payer: Medicare Other | Source: Ambulatory Visit | Attending: Rheumatology | Admitting: Rheumatology

## 2016-04-01 DIAGNOSIS — M542 Cervicalgia: Secondary | ICD-10-CM

## 2016-04-01 MED ORDER — TRIAMCINOLONE ACETONIDE 40 MG/ML IJ SUSP (RADIOLOGY)
60.0000 mg | Freq: Once | INTRAMUSCULAR | Status: AC
  Administered 2016-04-01: 60 mg via EPIDURAL

## 2016-04-01 MED ORDER — IOPAMIDOL (ISOVUE-M 300) INJECTION 61%
1.0000 mL | Freq: Once | INTRAMUSCULAR | Status: AC | PRN
  Administered 2016-04-01: 1 mL via EPIDURAL

## 2016-04-01 NOTE — Discharge Instructions (Signed)

## 2016-04-19 ENCOUNTER — Encounter: Payer: Self-pay | Admitting: Internal Medicine

## 2016-04-19 ENCOUNTER — Ambulatory Visit (INDEPENDENT_AMBULATORY_CARE_PROVIDER_SITE_OTHER): Payer: Medicare Other | Admitting: Internal Medicine

## 2016-04-19 VITALS — Ht 62.0 in | Wt 132.5 lb

## 2016-04-19 DIAGNOSIS — R150 Incomplete defecation: Secondary | ICD-10-CM | POA: Diagnosis not present

## 2016-04-19 DIAGNOSIS — R195 Other fecal abnormalities: Secondary | ICD-10-CM | POA: Diagnosis not present

## 2016-04-19 DIAGNOSIS — K589 Irritable bowel syndrome without diarrhea: Secondary | ICD-10-CM

## 2016-04-19 MED ORDER — DIPHENOXYLATE-ATROPINE 2.5-0.025 MG PO TABS: ORAL_TABLET | ORAL | 1 refills | Status: DC

## 2016-04-19 NOTE — Progress Notes (Signed)
Subjective:    Patient ID: Valerie Decker, female    DOB: 04/15/1931, 82 y.o.   MRN: [id number]  HPI Valerie Decker is an 82 year old female with a past medical history of IBS with loose stools, diverticulosis, history of H. pylori status post treatment is here for follow-up. She is here with her husband today. She was seen on 01/30/2016 to discuss her diarrhea and irritable bowel. She was treated with rifaximin 550 mg twice a day 14 days and given a prescription for Bentyl. She did begin Benefiber a teaspoon daily.  She reports that the rifaximin helped considerably with the diarrhea that she is now having smaller bowel movements with some urgency several times per day. He is feeling incomplete evacuation. She also occasionally reports borborygmi. She has taken Bentyl 10 mg 3 times a day not seen much change. She uses Imodium as a "last resort" and this leads to resolution of bowel movements and urgency. When she takes an Imodium she often will not have a bowel movement for 2 or 3 days. She reports she feels that she needs to go more frequently and so she will then take MiraLAX. She does not take MiraLAX because she feels constipated hours having abdominal pain, bloating etc. She started Benefiber tablespoon daily and seems to be tolerating this well. Appetite is been good. No blood in her stool or melena. She reports that her loose stools and urgency in the past have led to her rarely wanting to leave the house including not go to church, out to dinner, or other social activities. She did recently have to attend a funeral of a dear friend and took an Imodium prior to this service.   Review of Systems As per history of present illness, otherwise negative  Current Medications, Allergies, Past Medical History, Past Surgical History, Family History and Social History were reviewed in [employer] record.     Objective:   Physical Exam BP (P) 134/68   Pulse (P) 72   Ht 5\' 2"   (1.575 m) Comment: measured without shoes  Wt 132 lb 8 oz (60.1 kg)   BMI 24.23 kg/m  Constitutional: Well-developed and well-nourished. No distress. HEENT: Normocephalic and atraumatic. Conjunctivae are normal.  No scleral icterus. Neck: Neck supple. Trachea midline. Cardiovascular: Normal rate, regular rhythm and intact distal pulses.  Pulmonary/chest: Effort normal and breath sounds normal. No wheezing, rales or rhonchi. Abdominal: Soft, nontender, nondistended. Bowel sounds active throughout.  Extremities: no clubbing, cyanosis, or edema Neurological: Alert and oriented to person place and time. Skin: Skin is warm and dry. Psychiatric: Normal mood and affect. Behavior is normal.  Her last colonoscopy was performed on 11/14/2008 by Dr. Verl Blalock. This revealed decreased rectal tone on DRE. The prep was excellent. There was mild diverticulosis in the sigmoid and descending colon but the exam was otherwise normal. Random biopsies were performed which were normal. Small bowel was also biopsied and normal without celiac disease.  Endoscopy was performed on the same day which was normal. Urease test was positive and she was treated for H. pylori.     Assessment & Plan:   82 year old female with a past medical history of IBS with loose stools, diverticulosis, history of H. pylori status post treatment is here for follow-up  1. IBS/incomplete defecation/history of chronic loose stools -- rifaximin seemed to make a positive benefit in relation to the wateriness or liquid diarrhea. She still is having more incomplete evacuation and urgency. No bleeding or melena. No abdominal  pain. I recommended she stop the Bentyl as it has not seem to be effective. I would like her to increase Benefiber to 1-2 tablespoons daily. This should help bulk the stool more and hopefully aid in more complete evacuation. I've also reassured her that she doesn't have to have a bowel movement every day. I'm going to  give her a prescription for Lomotil 1 tablet 4 times a day to use only as needed prior to social events which hopefully will allow her to be more active and less restricted. I will see her back in 2-3 months for reassessment, sooner if necessary  25 minutes spent with the patient today. Greater than 50% was spent in counseling and coordination of care with the patient

## 2016-04-19 NOTE — Patient Instructions (Signed)
Please follow up with Dr Hilarie Fredrickson on Monday, 07/08/16 @ 1:45 pm.  Discontinue dicyclomine.  We have sent the following medications to your pharmacy for you to pick up at your convenience: Lomotil 1 tablet four times daily as needed.  Increase benefiber to 1-2 tablespoons daily  If you are age 80 or older, your body mass index should be between 23-30. Your Body mass index is 24.23 kg/m. If this is out of the aforementioned range listed, please consider follow up with your Primary Care Provider.  If you are age 45 or younger, your body mass index should be between 19-25. Your Body mass index is 24.23 kg/m. If this is out of the aformentioned range listed, please consider follow up with your Primary Care Provider.

## 2016-05-15 ENCOUNTER — Encounter (HOSPITAL_BASED_OUTPATIENT_CLINIC_OR_DEPARTMENT_OTHER): Payer: Self-pay

## 2016-05-15 ENCOUNTER — Emergency Department (HOSPITAL_BASED_OUTPATIENT_CLINIC_OR_DEPARTMENT_OTHER)
Admission: EM | Admit: 2016-05-15 | Discharge: 2016-05-15 | Disposition: A | Discharge status: Home / Self Care | Payer: Medicare Other | Attending: Emergency Medicine | Admitting: Emergency Medicine

## 2016-05-15 DIAGNOSIS — I10 Essential (primary) hypertension: Secondary | ICD-10-CM | POA: Insufficient documentation

## 2016-05-15 DIAGNOSIS — J45909 Unspecified asthma, uncomplicated: Secondary | ICD-10-CM | POA: Insufficient documentation

## 2016-05-15 DIAGNOSIS — W2203XA Walked into furniture, initial encounter: Secondary | ICD-10-CM | POA: Insufficient documentation

## 2016-05-15 DIAGNOSIS — Y999 Unspecified external cause status: Secondary | ICD-10-CM | POA: Diagnosis not present

## 2016-05-15 DIAGNOSIS — Z91040 Latex allergy status: Secondary | ICD-10-CM | POA: Insufficient documentation

## 2016-05-15 DIAGNOSIS — Y929 Unspecified place or not applicable: Secondary | ICD-10-CM | POA: Insufficient documentation

## 2016-05-15 DIAGNOSIS — Y9389 Activity, other specified: Secondary | ICD-10-CM | POA: Diagnosis not present

## 2016-05-15 DIAGNOSIS — E039 Hypothyroidism, unspecified: Secondary | ICD-10-CM | POA: Insufficient documentation

## 2016-05-15 DIAGNOSIS — S8992XA Unspecified injury of left lower leg, initial encounter: Secondary | ICD-10-CM | POA: Diagnosis present

## 2016-05-15 DIAGNOSIS — S81812A Laceration without foreign body, left lower leg, initial encounter: Secondary | ICD-10-CM | POA: Diagnosis not present

## 2016-05-15 MED ORDER — SULFAMETHOXAZOLE-TRIMETHOPRIM 800-160 MG PO TABS: 1.0000 | ORAL_TABLET | Freq: Two times a day (BID) | ORAL | 0 refills | Status: AC

## 2016-05-15 MED ORDER — SULFAMETHOXAZOLE-TRIMETHOPRIM 800-160 MG PO TABS
1.0000 | ORAL_TABLET | Freq: Once | ORAL | Status: AC
  Administered 2016-05-15: 1 via ORAL
  Filled 2016-05-15: qty 1

## 2016-05-15 NOTE — ED Triage Notes (Signed)
Trip/fall into furniture approx 830am-skin tears to left lower leg per pt-area is dressed and was sent from [facility] PCP-NAD-steady gait

## 2016-05-15 NOTE — ED Notes (Signed)
Pt denies hitting head and no LOC from fall. Pt been ambulatory on injured extremity all day with no difficulty.

## 2016-05-15 NOTE — ED Provider Notes (Signed)
[facility]PT [facility] Provider Note   CSN: [id number] Arrival date & time: 05/15/16  2028  By signing my name below, I, Evelene Croon, attest that this documentation has been prepared under the direction and in the presence of Virgel Manifold, MD . Electronically Signed: Evelene Croon, Scribe. 05/15/2016. 9:40 PM.   History   Chief Complaint Chief Complaint  Patient presents with  . Leg Injury     The history is provided by the patient. No language interpreter was used.     HPI Comments:  Valerie Decker is a 82 y.o. female who presents to the Emergency Department complaining of wound to the left lower leg s/p fall this evening. She notes moderate pain to the site. Pt states she missed the step stool that she uses to get in and out of bed and fell striking the leg against the stool. No LOC or head injury. Pt has no other acute complaints or injuries at this time.    Past Medical History:  Diagnosis Date  . Allergic rhinitis, cause unspecified   . Carpal tunnel syndrome   . Diverticulosis   . Elevated serum homocysteine level   . Esophageal reflux   . H. pylori infection 2010  . Irritable bowel syndrome   . Mixed hyperlipidemia   . Neuralgia, neuritis, and radiculitis, unspecified   . Osteopenia   . Unspecified asthma(493.90)   . Unspecified essential hypertension   . Unspecified hypothyroidism   . Unspecified sleep apnea    marginal; intolerant to CPAP    Patient Active Problem List   Diagnosis Date Noted  . Syncope and collapse 07/01/2012  . Unspecified adverse effect of unspecified drug, medicinal and biological substance 03/12/2012  . Pulmonary nodule 03/12/2012  . OSTEOPENIA 06/05/2010  . ALLERGIC RHINITIS 04/18/2009  . GERD 11/08/2008  . IRRITABLE BOWEL SYNDROME 10/21/2008  . HYPERLIPIDEMIA 07/31/2007  . SYMPTOM, APNEA, SLEEP NOS 06/09/2007  . HYPOTHYROIDISM 05/14/2007  . HYPERTENSION, ESSENTIAL NOS 05/14/2007  . RADICULOPATHY 01/21/2007  . CARPAL TUNNEL  SYNDROME 11/24/2006  . ASTHMA 11/24/2006    Past Surgical History:  Procedure Laterality Date  . BREAST BIOPSY    . COLONOSCOPY     Tics  . DILATION AND CURETTAGE OF UTERUS    . facial plastic surgery    . TONSILLECTOMY AND ADENOIDECTOMY     ? uvulectomy  . UVULECTOMY      OB History    No data available       Home Medications    Prior to Admission medications   Medication Sig Start Date End Date Taking? Authorizing Provider  budesonide (PULMICORT) 0.5 MG/2ML nebulizer solution Bid as needed 07/01/12   Hendricks Limes, MD  Budesonide-Formoterol Fumarate (SYMBICORT IN) Inhale 2 puffs into the lungs 2 (two) times daily. Symbicort-strength unknown     Historical Provider, MD  Calcium Carbonate (CALCIUM 600 PO) Take by mouth. 1/2 by mouth two times daily      Historical Provider, MD  citalopram (CELEXA) 20 MG tablet Take 20 mg by mouth daily. 05/19/14   Historical Provider, MD  Coenzyme Q10 (COQ10) 100 MG CAPS Take by mouth daily.      Historical Provider, MD  diltiazem (CARDIZEM CD) 120 MG 24 hr capsule TAKE 1 CAPSULE EVERY DAY. 11/09/13   Hendricks Limes, MD  diphenoxylate-atropine (LOMOTIL) 2.5-0.025 MG tablet Take 1 tablet by mouth 4 times daily as needed for loose stools. 04/19/16   Jerene Bears, MD  fluticasone (FLONASE) 50 MCG/ACT nasal spray Place  2 sprays into the nose 2 (two) times daily.     Historical Provider, MD  ibuprofen (ADVIL,MOTRIN) 600 MG tablet Take 1 tablet (600 mg total) by mouth every 8 (eight) hours as needed. 06/10/14   Etta Quill, NP  levalbuterol Penne Lash) 1.25 MG/3ML nebulizer solution Take 1 ampule by nebulization 2 (two) times daily.      Historical Provider, MD  levothyroxine (SYNTHROID, LEVOTHROID) 25 MCG tablet TAKE 1 TABLET ONCE DAILY. 09/17/13   Hendricks Limes, MD  lisinopril (PRINIVIL,ZESTRIL) 10 MG tablet TAKE 1 TABLET ONCE DAILY. 09/21/13   Hendricks Limes, MD  montelukast (SINGULAIR) 10 MG tablet Take 10 mg by mouth at bedtime.    Historical  Provider, MD  simvastatin (ZOCOR) 10 MG tablet TAKE ONE TABLET AT BEDTIME. 09/21/13   Hendricks Limes, MD  traMADol (ULTRAM) 50 MG tablet Take by mouth every 6 (six) hours as needed.    Historical Provider, MD    Family History Family History  Problem Relation Age of Onset  . Heart failure Mother     liver disease  . Liver disease Mother   . Stroke Maternal Aunt   . COPD Maternal Aunt   . Lung cancer Maternal Aunt   . Asthma Maternal Grandfather   . Colon cancer Neg Hx     Social History Social History  Substance Use Topics  . Smoking status: Never Smoker  . Smokeless tobacco: Never Used     Comment: Second hand smoke from mother & husband for decades  . Alcohol use No     Allergies   Penicillins and Latex   Review of Systems Review of Systems  Constitutional: Negative for chills and fever.  Respiratory: Negative for shortness of breath.   Cardiovascular: Negative for chest pain.  Skin: Positive for wound.  Neurological: Negative for syncope.  All other systems reviewed and are negative.   Physical Exam Updated Vital Signs BP 171/79 (BP Location: Right Arm)   Pulse 88   Temp 98.1 F (36.7 C) (Oral)   Resp 16   Ht 5\' 4"  (1.626 m)   Wt 130 lb (59 kg)   SpO2 97%   BMI 22.31 kg/m   Physical Exam  Constitutional: She is oriented to person, place, and time. She appears well-developed and well-nourished. No distress.  HENT:  Head: Normocephalic and atraumatic.  Eyes: EOM are normal.  Neck: Normal range of motion.  Cardiovascular: Normal rate, regular rhythm and normal heart sounds.   Pulmonary/Chest: Effort normal and breath sounds normal.  Abdominal: Soft. She exhibits no distension. There is no tenderness.  Musculoskeletal: Normal range of motion.  Neurological: She is alert and oriented to person, place, and time.  Skin: Skin is warm and dry.  Large skin tear to mid left shin; no active bleeding   Psychiatric: She has a normal mood and affect. Judgment  normal.  Nursing note and vitals reviewed.    ED Treatments / Results  DIAGNOSTIC STUDIES:  Oxygen Saturation is 97% on RA, normal by my interpretation.    COORDINATION OF CARE:  9:38 PM Discussed treatment plan with pt at bedside and pt agreed to plan.  Labs (all labs ordered are listed, but only abnormal results are displayed) Labs Reviewed - No data to display  EKG  EKG Interpretation None       Radiology No results found.  Procedures Procedures (including critical care time)  Medications Ordered in ED Medications - No data to display   Initial Impression /  Assessment and Plan / ED Course  I have reviewed the triage vital signs and the nursing notes.  Pertinent labs & imaging results that were available during my care of the patient were reviewed by me and considered in my medical decision making (see chart for details).  Clinical Course    85yF with skin tear to LLE. Doubt osseous abnormality. Tried to piece back together with steristrips the best I could   Continued wound care discussed. She reports her tetanus is current.   Final Clinical Impressions(s) / ED Diagnoses   Final diagnoses:  Skin tear of left lower leg without complication, initial encounter    New Prescriptions New Prescriptions   No medications on file   I personally preformed the services scribed in my presence. The recorded information has been reviewed is accurate. Virgel Manifold, MD.     Virgel Manifold, MD 05/21/16 [phone]

## 2016-05-15 NOTE — ED Notes (Signed)
MD at bedside. 

## 2016-05-23 ENCOUNTER — Other Ambulatory Visit: Payer: Self-pay | Admitting: Family Medicine

## 2016-05-23 DIAGNOSIS — R748 Abnormal levels of other serum enzymes: Secondary | ICD-10-CM

## 2016-05-23 DIAGNOSIS — R1033 Periumbilical pain: Secondary | ICD-10-CM

## 2016-05-29 ENCOUNTER — Ambulatory Visit
Admission: RE | Admit: 2016-05-29 | Discharge: 2016-05-29 | Disposition: A | Discharge status: Home / Self Care | Payer: Medicare Other | Source: Ambulatory Visit | Attending: Family Medicine | Admitting: Family Medicine

## 2016-05-29 DIAGNOSIS — R1033 Periumbilical pain: Secondary | ICD-10-CM

## 2016-05-29 DIAGNOSIS — R748 Abnormal levels of other serum enzymes: Secondary | ICD-10-CM

## 2016-05-29 MED ORDER — IOPAMIDOL (ISOVUE-300) INJECTION 61%
100.0000 mL | Freq: Once | INTRAVENOUS | Status: AC | PRN
  Administered 2016-05-29: 100 mL via INTRAVENOUS

## 2016-07-08 ENCOUNTER — Encounter: Payer: Self-pay | Admitting: Internal Medicine

## 2016-07-08 ENCOUNTER — Ambulatory Visit (INDEPENDENT_AMBULATORY_CARE_PROVIDER_SITE_OTHER): Payer: Medicare Other | Admitting: Internal Medicine

## 2016-07-08 VITALS — BP 140/70 | HR 73 | Ht 62.0 in | Wt 132.0 lb

## 2016-07-08 DIAGNOSIS — M6289 Other specified disorders of muscle: Secondary | ICD-10-CM | POA: Diagnosis not present

## 2016-07-08 DIAGNOSIS — K589 Irritable bowel syndrome without diarrhea: Secondary | ICD-10-CM | POA: Diagnosis not present

## 2016-07-08 DIAGNOSIS — R195 Other fecal abnormalities: Secondary | ICD-10-CM

## 2016-07-08 MED ORDER — DIPHENOXYLATE-ATROPINE 2.5-0.025 MG PO TABS: ORAL_TABLET | ORAL | 1 refills | Status: DC

## 2016-07-08 NOTE — Patient Instructions (Signed)
We have sent the following medications to your pharmacy for you to pick up at your convenience: Lomotil every morning. May take additional dose if needed up to 2 tablets three times daily.  Discontinue the benefiber since it did not seem to help.  Follow up with Dr Hilarie Fredrickson in 6 months.  If you are age 80 or older, your body mass index should be between 23-30. Your Body mass index is 24.14 kg/m. If this is out of the aforementioned range listed, please consider follow up with your Primary Care Provider.  If you are age 21 or younger, your body mass index should be between 19-25. Your Body mass index is 24.14 kg/m. If this is out of the aformentioned range listed, please consider follow up with your Primary Care Provider.

## 2016-07-08 NOTE — Progress Notes (Signed)
Subjective:    Patient ID: Valerie Decker, female    DOB: January 20, 1931, 82 y.o.   MRN: [id number]  HPI Chaselynn Draft is an 82 year old female with past medical history of IBS with loose stools, diverticulosis, history of H. pylori status post treatment is here for follow-up. She was last seen in September 2017. She is here alone today. She was previous he treated with rifaximin for IBS--D and this improved her symptoms though she has continued with intermittent loose stools and incomplete defecation. After her last visit I had her try Benefiber in increasing doses but she found this actually cause more diarrhea. She stopped this for this reason. Years ago she recalls Dr. Sharlett Iles trying her on Citrucel And she seems to remember the same result. She reports she still has loose stools about 4 out of 7 days. This usually occurs about 3 times per day. She reports the Lomotil has been "wonderful". Without Lomotil she has unformed loose stools which she feels incomplete evacuation. Her primary provider had ordered a CT scan just to what the patient describes put my fears to rest. This was performed of the abdomen and pelvis in October of this year. This showed no acute inflammatory process within the abdomen. Cecum was low lying without inflammation. Bowel looked normal other than moderate stool burden in the descending and proximal sigmoid colon. There was redundancy noted in the sigmoid which caused mass effect on the bladder. There was no evidence of colonic obstruction.   She denies rectal bleeding or melena. No abdominal pain. Good appetite. No fevers or chills.   Review of Systems As per history of present illness, otherwise negative  Current Medications, Allergies, Past Medical History, Past Surgical History, Family History and Social History were reviewed in [employer] record.     Objective:   Physical Exam BP 140/70   Pulse 73   Ht 5\' 2"  (1.575 m)   Wt 132 lb (59.9  kg)   BMI 24.14 kg/m  Constitutional: Well-developed and well-nourished. No distress. HEENT: Normocephalic and atraumatic.  Conjunctivae are normal.  No scleral icterus. Neck: Neck supple. Trachea midline. Cardiovascular: Normal rate, regular rhythm and intact distal pulses.  Pulmonary/chest: Effort normal and breath sounds normal. No wheezing, rales or rhonchi. Abdominal: Soft, nontender, nondistended. Bowel sounds active throughout. There are no masses palpable. No hepatosplenomegaly. Extremities: no clubbing, cyanosis, or edema Neurological: Alert and oriented to person place and time. Skin: Skin is warm and dry. No rashes noted. Psychiatric: Normal mood and affect. Behavior is normal.  CT ABDOMEN AND PELVIS WITH CONTRAST   TECHNIQUE: Multidetector CT imaging of the abdomen and pelvis was performed using the standard protocol following bolus administration of intravenous contrast.   CONTRAST:  155mL ISOVUE-300 IOPAMIDOL (ISOVUE-300) INJECTION 61%   COMPARISON:  None.   FINDINGS: Lower chest: Lung bases shows 5.4 mm nodule with in right lower lobe centrally.   Hepatobiliary: Enhanced liver is unremarkable. No calcified gallstones are noted within gallbladder.   Pancreas: Enhanced pancreas is unremarkable.   Spleen: Enhanced spleen shows punctate calcifications probable from prior granulomatous disease. A splenic cyst posterior medially measures 1 cm.   Adrenals/Urinary Tract: No adrenal gland mass. Enhanced kidneys are symmetrical in size. No hydronephrosis or hydroureter. Delayed renal images shows bilateral renal symmetrical excretion. Bilateral visualized proximal ureter is unremarkable.   Stomach/Bowel: Oral contrast material was given to the patient. No gastric outlet obstruction. No small bowel obstruction. No thickened or dilated small bowel loops are noted. There  is a low lying cecum. The tip of the cecum is in mid right pelvis just above the urinary bladder.  Normal appendix is noted in coronal image 34. No pericecal inflammation. The terminal ileum is unremarkable. No distal colonic obstruction. Moderate stool are noted in descending colon and proximal sigmoid colon.   Vascular/Lymphatic: No aortic aneurysm. Atherosclerotic calcifications of abdominal aorta and iliac arteries. No retroperitoneal or mesenteric adenopathy.   Reproductive: The uterus is atrophic.  No adnexal mass.   Other: There is mild mass effect on the left aspect of urinary bladder by sigmoid colon which is mild redundant.   Musculoskeletal: No destructive bony lesions are noted. Sagittal images of the spine shows degenerative changes thoracolumbar spine. Degenerative changes are noted right SI joint. Degenerative changes pubic symphysis.   IMPRESSION: 1. There is no evidence of acute inflammatory process within abdomen. There is a low lying cecum. No pericecal inflammation. 2. No hydronephrosis or hydroureter. 3. No small bowel obstruction. 4. Moderate stool noted in descending colon and proximal sigmoid colon. Mild redundant proximal sigmoid colon with mild mass effect on left aspect of urinary bladder. No distal colonic obstruction.     Electronically Signed   By: Lahoma Crocker M.D.   On: 05/29/2016 14:15       Assessment & Plan:  82 year old female with past medical history of IBS with loose stools, diverticulosis, history of H. pylori status post treatment is here for follow-up.  1. IBS, chronic loose stools/pelvic floor dysfunction with incomplete evacuation -- we have reviewed the CT scan results together today and these are reassuring. Her symptoms are consistent with irritable bowel and are long-standing. I also think it is very likely that she has pelvic floor dysfunction. We discussed anorectal manometry and pelvic floor physical therapy but for now she is happy with her symptom control using Lomotil. She will continue Lomotil but I will have her take 1  capsule on a regular daily basis she can use additional doses up to 2 tablets 3 times a day if needed throughout the day to control loose stools. She is very happy with this plan. She can follow-up in 6 months time, sooner if necessary.  25 minutes spent with the patient today. Greater than 50% was spent in counseling and coordination of care with the patient

## 2016-07-29 ENCOUNTER — Other Ambulatory Visit: Payer: Self-pay | Admitting: Neurological Surgery

## 2016-07-29 DIAGNOSIS — M542 Cervicalgia: Secondary | ICD-10-CM

## 2016-08-15 ENCOUNTER — Ambulatory Visit
Admission: RE | Admit: 2016-08-15 | Discharge: 2016-08-15 | Disposition: A | Discharge status: Home / Self Care | Payer: Medicare Other | Source: Ambulatory Visit | Attending: Neurological Surgery | Admitting: Neurological Surgery

## 2016-08-15 DIAGNOSIS — M542 Cervicalgia: Secondary | ICD-10-CM

## 2016-08-15 MED ORDER — TRIAMCINOLONE ACETONIDE 40 MG/ML IJ SUSP (RADIOLOGY)
25.0000 mg | Freq: Once | INTRAMUSCULAR | Status: AC
  Administered 2016-08-15: 25 mg via INTRA_ARTICULAR

## 2016-08-15 MED ORDER — IOPAMIDOL (ISOVUE-M 300) INJECTION 61%
1.0000 mL | Freq: Once | INTRAMUSCULAR | Status: AC | PRN
  Administered 2016-08-15: 1 mL via INTRA_ARTICULAR

## 2016-08-15 NOTE — Discharge Instructions (Signed)

## 2016-09-09 ENCOUNTER — Telehealth: Payer: Self-pay | Admitting: Internal Medicine

## 2016-09-09 NOTE — Telephone Encounter (Signed)
Dr Hilarie Fredrickson-  Apparently patient's insurance will no longer cover lomotil but she is able to get imodium. Would imodium suffice in place of lomotil?

## 2016-09-10 ENCOUNTER — Other Ambulatory Visit: Payer: Self-pay | Admitting: Neurological Surgery

## 2016-09-10 DIAGNOSIS — M542 Cervicalgia: Secondary | ICD-10-CM

## 2016-09-10 MED ORDER — LOPERAMIDE HCL 2 MG PO CAPS: 2.0000 mg | ORAL_CAPSULE | Freq: Three times a day (TID) | ORAL | 2 refills | Status: DC | PRN

## 2016-09-10 NOTE — Telephone Encounter (Signed)
Okay to substitute loperamide 2 mg TIDPRN as needed for lomotil PRN

## 2016-09-10 NOTE — Telephone Encounter (Signed)
Advised patient of Dr Vena Rua recommendations. She verbalizes understanding.

## 2016-10-17 ENCOUNTER — Ambulatory Visit
Admission: RE | Admit: 2016-10-17 | Discharge: 2016-10-17 | Disposition: A | Discharge status: Home / Self Care | Payer: Medicare Other | Source: Ambulatory Visit | Attending: Neurological Surgery | Admitting: Neurological Surgery

## 2016-10-17 DIAGNOSIS — M542 Cervicalgia: Secondary | ICD-10-CM

## 2016-10-17 MED ORDER — TRIAMCINOLONE ACETONIDE 40 MG/ML IJ SUSP (RADIOLOGY)
60.0000 mg | Freq: Once | INTRAMUSCULAR | Status: AC
  Administered 2016-10-17: 60 mg via INTRA_ARTICULAR

## 2016-10-17 MED ORDER — IOPAMIDOL (ISOVUE-M 300) INJECTION 61%
1.0000 mL | Freq: Once | INTRAMUSCULAR | Status: AC | PRN
  Administered 2016-10-17: 1 mL via INTRA_ARTICULAR

## 2016-10-17 NOTE — Discharge Instructions (Signed)

## 2016-12-12 ENCOUNTER — Telehealth: Payer: Self-pay | Admitting: Internal Medicine

## 2016-12-12 ENCOUNTER — Other Ambulatory Visit: Payer: Self-pay | Admitting: *Deleted

## 2016-12-12 MED ORDER — DIPHENOXYLATE-ATROPINE 2.5-0.025 MG PO TABS: ORAL_TABLET | ORAL | 1 refills | Status: DC

## 2016-12-12 NOTE — Telephone Encounter (Signed)
Patient states that she has been taking imodium but even one tablet daily constipates her and requires her to then take miralax etc to help with the constipation. Lomotil helped without constipating her. She would like to start taking Lomotil again. Patient has been scheduled for follow up with Dr Hilarie Fredrickson for 01/29/17 (she is due for 6 month follow up) and I have given her a script for Lomotil as was prescribed at her last office visit (1 tablet by mouth once daily, may have up to 2 tablets 3 times daily). I have asked that she discontinue imodium when she starts taking the lomotil. She verbalizes understanding of the above.

## 2016-12-18 ENCOUNTER — Telehealth: Payer: Self-pay | Admitting: Internal Medicine

## 2016-12-18 NOTE — Telephone Encounter (Signed)
Pt states she has had 4 very black stools today. Pt is concerned that there may be blood in her stool. Pt scheduled to see Alonza Bogus PA tomorrow at 3pm. Pt aware of appt.

## 2016-12-19 ENCOUNTER — Ambulatory Visit (INDEPENDENT_AMBULATORY_CARE_PROVIDER_SITE_OTHER): Payer: Medicare Other | Admitting: Gastroenterology

## 2016-12-19 ENCOUNTER — Encounter: Payer: Self-pay | Admitting: Gastroenterology

## 2016-12-19 VITALS — BP 140/70 | HR 92 | Ht 62.0 in | Wt 133.5 lb

## 2016-12-19 DIAGNOSIS — R195 Other fecal abnormalities: Secondary | ICD-10-CM | POA: Diagnosis not present

## 2016-12-19 DIAGNOSIS — R1084 Generalized abdominal pain: Secondary | ICD-10-CM | POA: Diagnosis not present

## 2016-12-19 MED ORDER — HYOSCYAMINE SULFATE 0.125 MG SL SUBL: 0.1250 mg | SUBLINGUAL_TABLET | Freq: Three times a day (TID) | SUBLINGUAL | 2 refills | Status: DC | PRN

## 2016-12-19 NOTE — Patient Instructions (Addendum)
Use Lomotil as needed.   We have sent the following medications to your pharmacy for you to pick up at your convenience: Levsin 0.125 mg every 8 hours as needed for cramping or pain.  Daily Benefiber or Citrucel powder.

## 2016-12-19 NOTE — Progress Notes (Addendum)
12/19/2016 Valerie Decker [id number] 1930/10/22   HISTORY OF PRESENT ILLNESS:  This is a pleasant 83 year old female who is known to Dr. Hilarie Fredrickson for treatment of her irritable bowel syndrome, diarrhea predominant and suspected pelvic floor dysfunction. She was last seen by him for these issues in November 2017 at which time they discussed anorectal manometry and pelvic floor therapy. She elected to continue her Lomotil as needed at that time. She presents to our office today with a primary concern of black stools. She tells me that for the past 3 weeks she's been having black stools. At first she thought it was from the [facility] that she had eaten, but then it persisted. She said initially the black stools were loose, but the last few days they have been harder stools.  We discussed fiber supplements such as Benefiber or Citrucel. She recalls taking Citrucel for a long time in the past, but then it seemed to cause her to have more loose stools.  She complains of diffuse abdominal discomfort and soreness.   Past Medical History:  Diagnosis Date  . Allergic rhinitis, cause unspecified   . Carpal tunnel syndrome   . Diverticulosis   . Elevated serum homocysteine level   . Esophageal reflux   . H. pylori infection 2010  . Irritable bowel syndrome   . Mixed hyperlipidemia   . Neuralgia, neuritis, and radiculitis, unspecified   . Osteopenia   . Unspecified asthma(493.90)   . Unspecified essential hypertension   . Unspecified hypothyroidism   . Unspecified sleep apnea    marginal; intolerant to CPAP   Past Surgical History:  Procedure Laterality Date  . BREAST BIOPSY    . COLONOSCOPY     Tics  . DILATION AND CURETTAGE OF UTERUS    . facial plastic surgery    . TONSILLECTOMY AND ADENOIDECTOMY     ? uvulectomy  . UVULECTOMY      reports that she has never smoked. She has never used smokeless tobacco. She reports that she does not drink alcohol or use drugs. family history includes  Asthma in her maternal grandfather; COPD in her maternal aunt; Heart failure in her mother; Liver disease in her mother; Lung cancer in her maternal aunt; Stroke in her maternal aunt. Allergies  Allergen Reactions  . Penicillins Diarrhea and Nausea And Vomiting  . Tape Rash    Red skin after tape removed after surgery      Outpatient Encounter Prescriptions as of 12/19/2016  Medication Sig  . Budesonide-Formoterol Fumarate (SYMBICORT IN) Inhale 2 puffs into the lungs 2 (two) times daily. Symbicort-strength unknown   . Calcium Carbonate (CALCIUM 600 PO) Take by mouth. 1/2 by mouth two times daily    . citalopram (CELEXA) 20 MG tablet Take 20 mg by mouth daily.  . Coenzyme Q10 (COQ10) 100 MG CAPS Take by mouth daily.    Marland Kitchen diltiazem (CARDIZEM CD) 120 MG 24 hr capsule TAKE 1 CAPSULE EVERY DAY.  . diphenoxylate-atropine (LOMOTIL) 2.5-0.025 MG tablet Take 1 tablet by mouth every morning, then take additional tablet as needed up to 2 tablets three times daily.  . fluticasone (FLONASE) 50 MCG/ACT nasal spray Place 2 sprays into the nose 2 (two) times daily.   Marland Kitchen ibuprofen (ADVIL,MOTRIN) 600 MG tablet Take 1 tablet (600 mg total) by mouth every 8 (eight) hours as needed.  . levalbuterol (XOPENEX) 1.25 MG/3ML nebulizer solution Take 1 ampule by nebulization 2 (two) times daily.    Marland Kitchen levothyroxine (SYNTHROID, LEVOTHROID) 25  MCG tablet TAKE 1 TABLET ONCE DAILY.  Marland Kitchen lisinopril (PRINIVIL,ZESTRIL) 10 MG tablet TAKE 1 TABLET ONCE DAILY.  . montelukast (SINGULAIR) 10 MG tablet Take 10 mg by mouth at bedtime.  Marland Kitchen omeprazole (PRILOSEC) 40 MG capsule Take 40 mg by mouth daily.  . simvastatin (ZOCOR) 10 MG tablet TAKE ONE TABLET AT BEDTIME.  . traMADol (ULTRAM) 50 MG tablet Take by mouth every 6 (six) hours as needed.  . hyoscyamine (LEVSIN SL) 0.125 MG SL tablet Place 1 tablet (0.125 mg total) under the tongue every 8 (eight) hours as needed.   No facility-administered encounter medications on file as of  12/19/2016.      REVIEW OF SYSTEMS  : All other systems reviewed and negative except where noted in the History of Present Illness.   PHYSICAL EXAM: BP 140/70 (BP Location: Left Arm, Patient Position: Sitting, Cuff Size: Normal)   Pulse 92   Ht 5\' 2"  (1.575 m) Comment: height measured without shoes  Wt 133 lb 8 oz (60.6 kg)   BMI 24.42 kg/m  General: Well developed white female in no acute distress Head: Normocephalic and atraumatic Eyes:  Sclerae anicteric, conjunctiva pink. Ears: Normal auditory acuity Lungs: Clear throughout to auscultation Heart: Regular rate and rhythm Abdomen: Soft, non-distended. Normal bowel sounds.  Minimal diffuse TTP. Rectal:  Rectal exam not performed but stool sample was tested and was hemoccult negative. Musculoskeletal: Symmetrical with no gross deformities  Skin: No lesions on visible extremities Extremities: No edema  Neurological: Alert oriented x 4, grossly non-focal Psychological:  Alert and cooperative. Normal mood and affect  ASSESSMENT AND PLAN: -Black stools:  Patient brought a stool sample.  It was not black, just dark brown and was hemoccult negative. -Long-standing IBS with loose stools with suspected pelvic floor dysfunction:  Rediscussed the possibility of anorectal manometry and pelvic floor therapy.  More recently she has been having some hard stools, but inevitably always goes back to loose stools. I have advised her to restart a daily powder fiber supplement such as Citrucel or Benefiber to give it another try as we do not have many options for the alternating stools. She will continue to use Lomotil on occasion as needed. I am going to give her some Levsin to use as needed for abdominal cramping.  *She will follow-up with Dr. Hilarie Fredrickson in June.  25 minutes spent with the patient today. Greater than 50% was spent in counseling and coordination of care with the patient    CC:  Maurice Small, MD   Addendum: Reviewed and agree with  management. Pyrtle, Lajuan Lines, MD

## 2016-12-23 ENCOUNTER — Telehealth: Payer: Self-pay | Admitting: *Deleted

## 2016-12-23 NOTE — Telephone Encounter (Signed)
Patient's lomotil has been approved until 08/18/17 under Medicare Part D as per [employer]. [id number].

## 2016-12-31 ENCOUNTER — Other Ambulatory Visit: Payer: Self-pay | Admitting: Neurological Surgery

## 2016-12-31 DIAGNOSIS — M810 Age-related osteoporosis without current pathological fracture: Secondary | ICD-10-CM

## 2017-01-29 ENCOUNTER — Ambulatory Visit: Payer: Medicare Other | Admitting: Internal Medicine

## 2017-02-05 ENCOUNTER — Ambulatory Visit: Payer: Medicare Other | Admitting: Internal Medicine

## 2017-02-10 ENCOUNTER — Ambulatory Visit: Payer: Medicare Other | Admitting: Internal Medicine

## 2017-02-20 ENCOUNTER — Ambulatory Visit: Payer: Medicare Other | Admitting: Internal Medicine

## 2017-07-25 ENCOUNTER — Other Ambulatory Visit: Payer: Self-pay | Admitting: *Deleted

## 2017-07-25 MED ORDER — DIPHENOXYLATE-ATROPINE 2.5-0.025 MG PO TABS: ORAL_TABLET | ORAL | 0 refills | Status: DC

## 2017-11-23 IMAGING — US US CAROTID DUPLEX BILAT
1 series · 13 of 24 positions shown · non-contrast
Comparison: 05/01/2012 MRA

CLINICAL DATA: Hypertension, neck pain, hyperlipidemia

EXAM:
BILATERAL CAROTID DUPLEX ULTRASOUND
TECHNIQUE: Gray scale imaging, color Doppler and duplex ultrasound were
performed of bilateral carotid and vertebral arteries in the neck.

[Series 1: us carotid duplex bilat · 0.08mm/px · 13 of 72 slices shown]
[im 1/72]
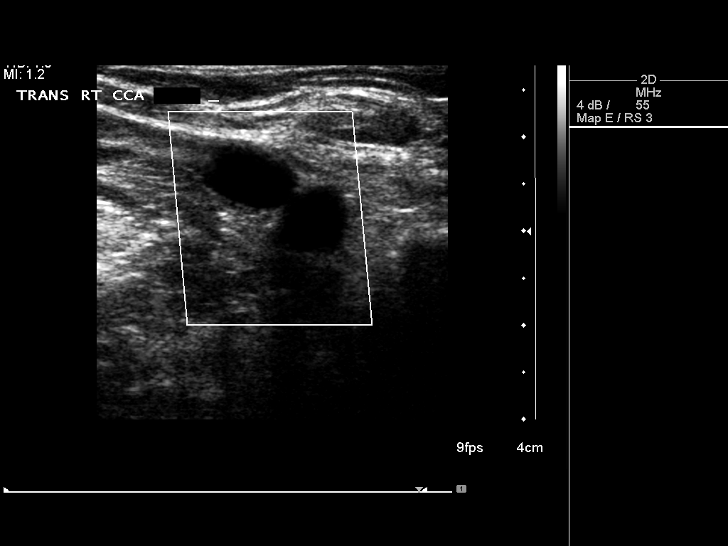
[im 7/72]
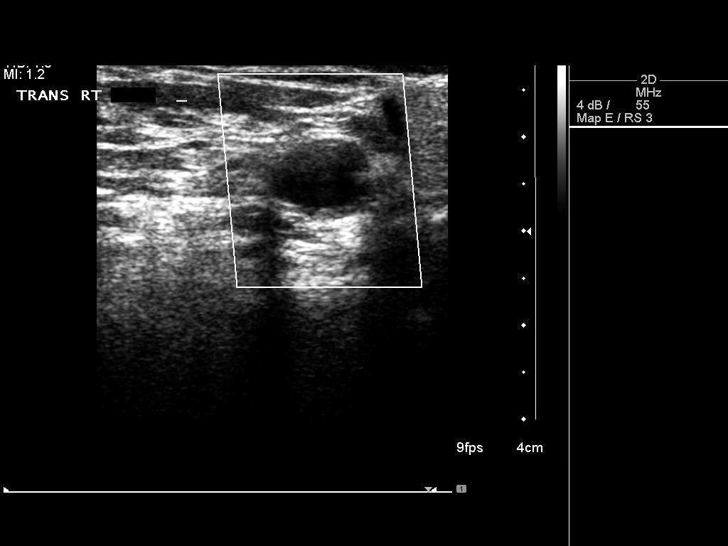
[im 13/72]
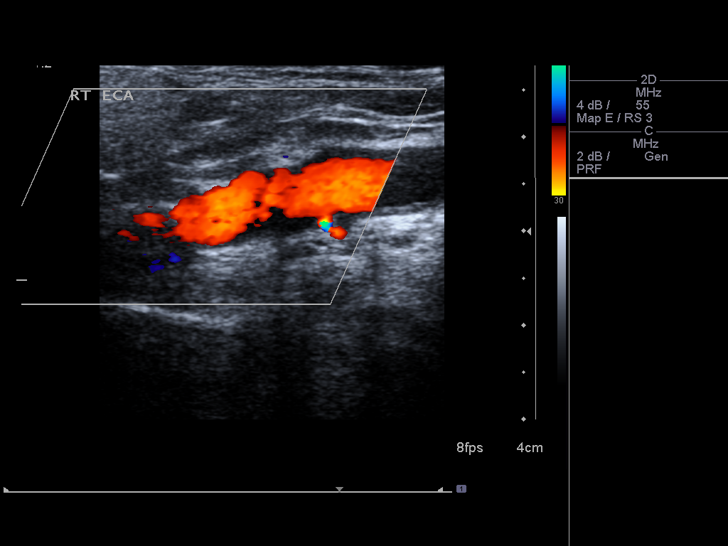
[im 19/72]
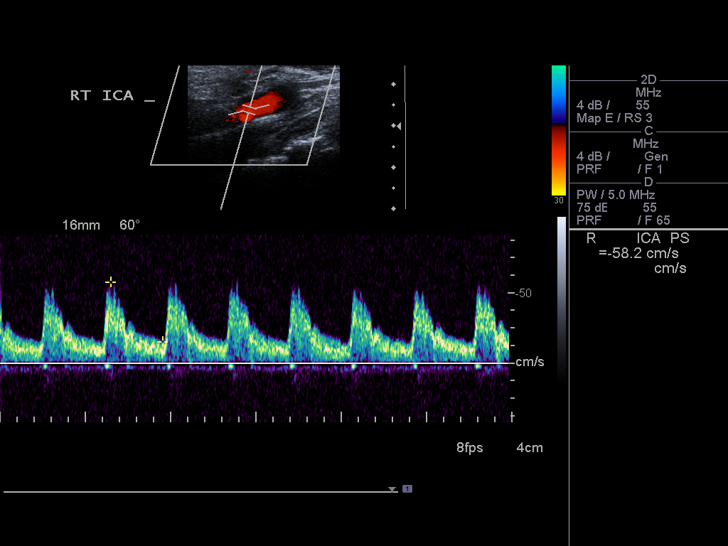
[im 25/72]
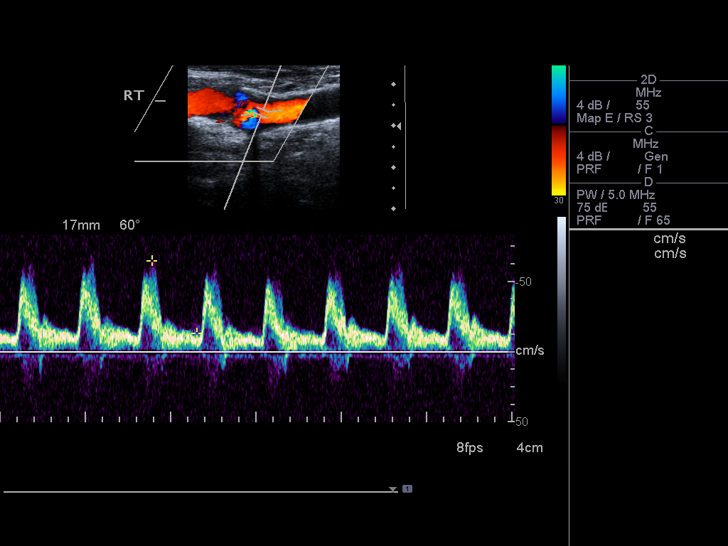
[im 31/72]
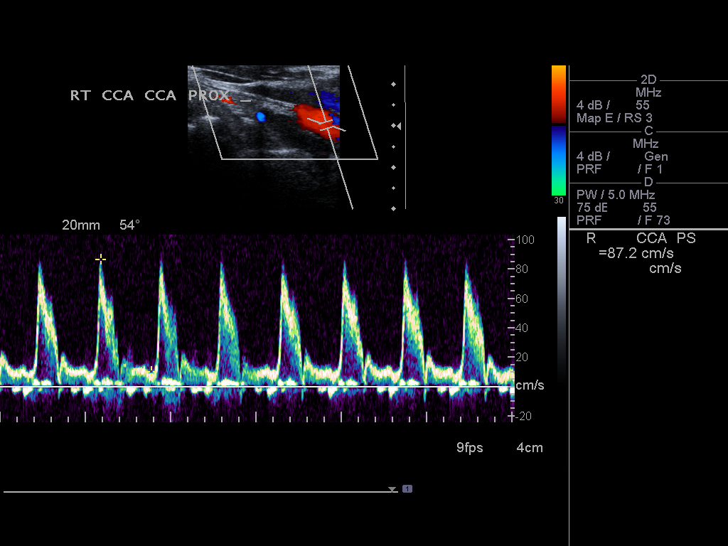
[im 38/72]
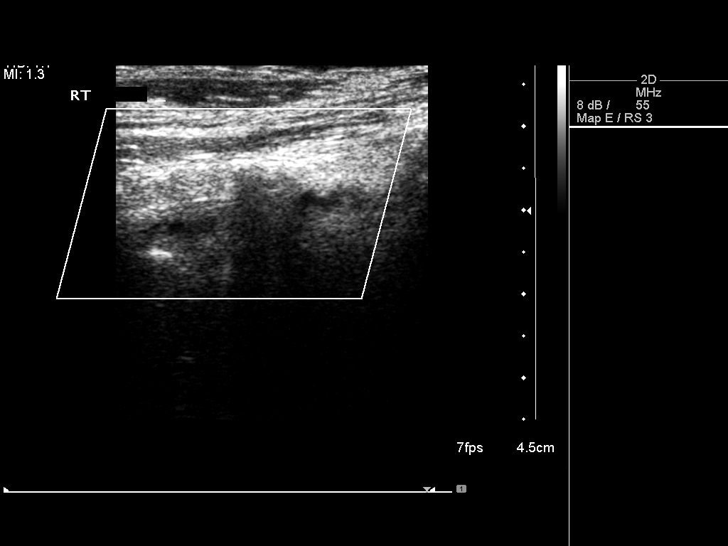
[im 41/72]
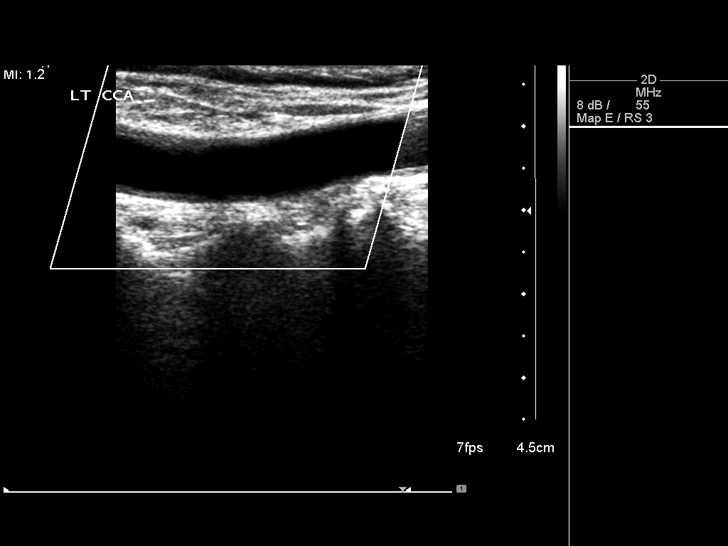
[im 47/72]
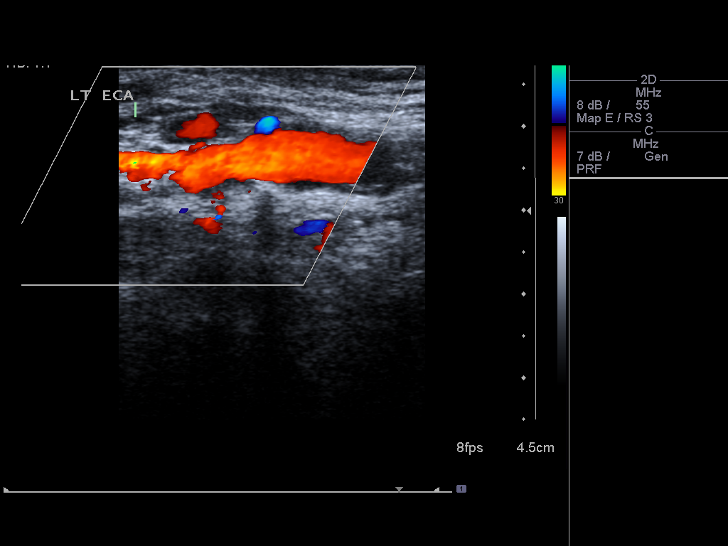
[im 53/72]
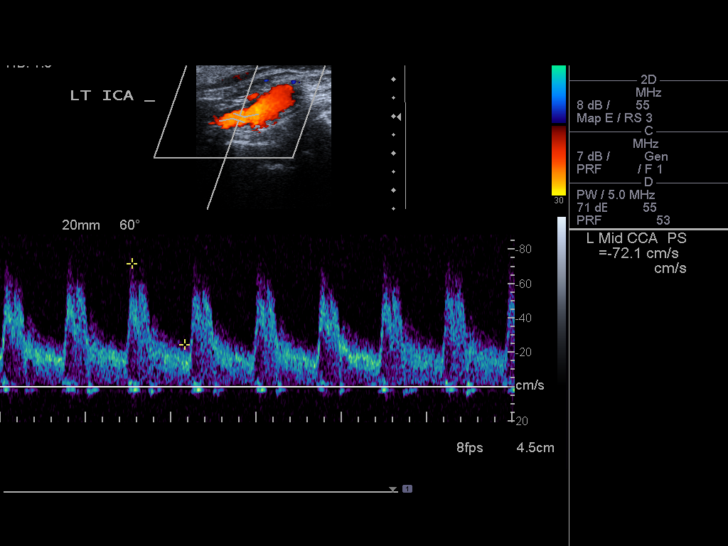
[im 59/72]
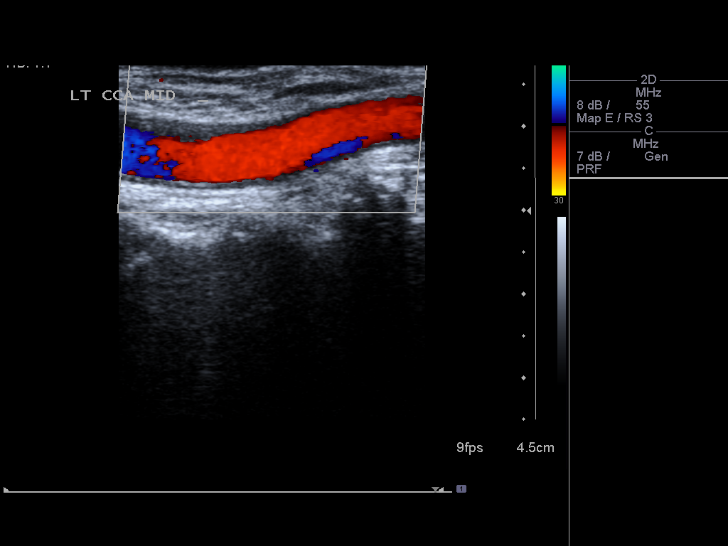
[im 65/72]
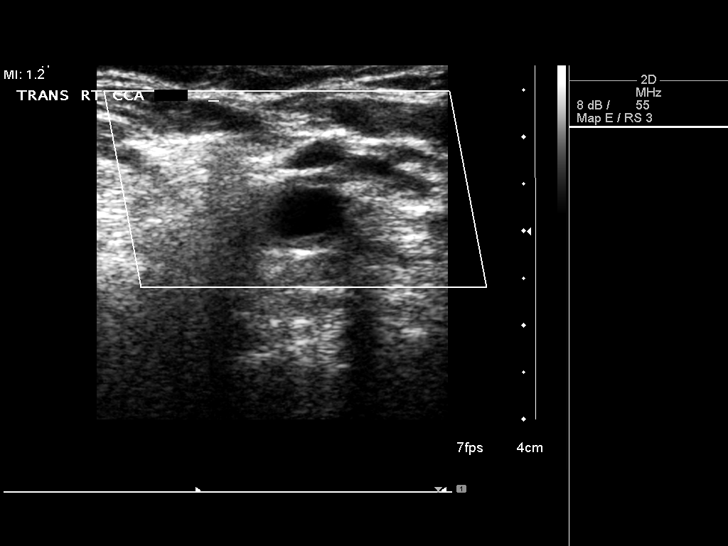
[im 72/72]
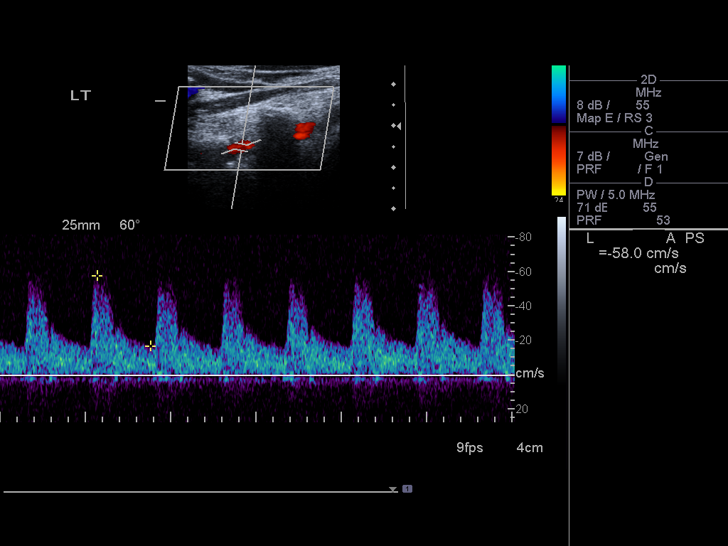

[13 of 24 positions shown; findings below may reference images not displayed]

FINDINGS: Criteria: Quantification of carotid stenosis is based on velocity
parameters that correlate the residual internal carotid diameter
with NASCET-based stenosis levels, using the diameter of the distal
internal carotid lumen as the denominator for stenosis measurement.

The following velocity measurements were obtained:

RIGHT

ICA:  73/19 cm/sec

CCA:  96/15 cm/sec

SYSTOLIC ICA/CCA RATIO:

DIASTOLIC ICA/CCA RATIO:

ECA:  67 cm/sec

LEFT

ICA:  72/25 cm/sec

CCA:  86/19 cm/sec

SYSTOLIC ICA/CCA RATIO:

DIASTOLIC ICA/CCA RATIO:

ECA:  68 cm/sec

RIGHT CAROTID ARTERY: Minor echogenic shadowing plaque formation. No
hemodynamically significant right ICA stenosis, velocity elevation,
or turbulent flow. Degree of narrowing less than 50%.

RIGHT VERTEBRAL ARTERY:  Antegrade

LEFT CAROTID ARTERY: Mild-to-moderate scattered heterogeneous plaque
formation. No hemodynamically significant left ICA stenosis,
velocity elevation, or turbulent flow.

LEFT VERTEBRAL ARTERY:  Antegrade
IMPRESSION: Left greater than right carotid atherosclerosis. No hemodynamically
significant ICA stenosis. Degree of narrowing less than 50%
bilaterally.

Patent antegrade vertebral flow bilaterally.

## 2018-01-19 ENCOUNTER — Other Ambulatory Visit: Payer: Self-pay | Admitting: Gastroenterology

## 2018-06-30 ENCOUNTER — Encounter: Payer: Self-pay | Admitting: Gastroenterology

## 2018-06-30 ENCOUNTER — Ambulatory Visit: Payer: Medicare Other | Admitting: Gastroenterology

## 2018-06-30 VITALS — BP 140/70 | HR 84 | Ht 62.0 in | Wt 132.2 lb

## 2018-06-30 DIAGNOSIS — K589 Irritable bowel syndrome without diarrhea: Secondary | ICD-10-CM

## 2018-06-30 MED ORDER — DIPHENOXYLATE-ATROPINE 2.5-0.025 MG PO TABS: ORAL_TABLET | ORAL | 2 refills | Status: DC

## 2018-06-30 MED ORDER — DICYCLOMINE HCL 10 MG PO CAPS: 10.0000 mg | ORAL_CAPSULE | Freq: Three times a day (TID) | ORAL | 2 refills | Status: DC

## 2018-06-30 NOTE — Patient Instructions (Signed)
We faxed the prescription for the lomotil tablets to [facility]. We sent the Bentyl ( dicyclomine) 10 mg electronically.today.   Consider take daily fiber powder, such as Citrucel or Benefiber.  Take 2 tablespoon in a glass of water or juice daily.  FIber product handout provided.  Normal BMI (Body Mass Index- based on height and weight) is between 23 and 30. Your BMI today is Body mass index is 24.19 kg/m. Marland Kitchen Please consider follow up  regarding your BMI with your Primary Care Provider.

## 2018-06-30 NOTE — Progress Notes (Addendum)
06/30/2018 Valerie Decker [id number] 06-05-1931   HISTORY OF PRESENT ILLNESS:  This is a pleasant 84 year old female who is a patient of Dr. Vena Rua.  She has long-standing IBS with loose stools with suspected pelvic floor dysfunction.  Overall she seems to be doing very well with Lomotil and Bentyl as needed for her diarrhea and abdominal cramping.  Says that she only uses these on occasion when she goes out and about; gives her a sense of security.  She seems satisfied with the results that she has seen with being able to use these medications.  She is asking for refills on those.  I did find out a new piece of information today, the fact that she has been using daily fleets suppositories for the past 50+ years.  This seems to be out of a sense of security rather than necessity.  Says that this way she knows that she has had a BM before leaving the house for the day.  She did try a fiber supplement, Optifiber, but says that it gave her a lot of gas.  Took citrucel for years but unsure why she had ever stopped it.   Past Medical History:  Diagnosis Date  . Allergic rhinitis, cause unspecified   . Carpal tunnel syndrome   . Diverticulosis   . Elevated serum homocysteine level   . Esophageal reflux   . H. pylori infection 2010  . Irritable bowel syndrome   . Irritable bowel syndrome with diarrhea   . Mixed hyperlipidemia   . Neuralgia, neuritis, and radiculitis, unspecified   . Osteopenia   . Unspecified asthma(493.90)   . Unspecified essential hypertension   . Unspecified hypothyroidism   . Unspecified sleep apnea    marginal; intolerant to CPAP   Past Surgical History:  Procedure Laterality Date  . BREAST BIOPSY    . COLONOSCOPY     Tics  . DILATION AND CURETTAGE OF UTERUS    . facial plastic surgery    . TONSILLECTOMY AND ADENOIDECTOMY     ? uvulectomy  . UVULECTOMY      reports that she has never smoked. She has never used smokeless tobacco. She reports that she does  not drink alcohol or use drugs. family history includes Asthma in her maternal grandfather; COPD in her maternal aunt; Heart failure in her mother; Liver disease in her mother; Lung cancer in her maternal aunt; Stroke in her maternal aunt. Allergies  Allergen Reactions  . Penicillins Diarrhea and Nausea And Vomiting  . Tape Rash    Red skin after tape removed after surgery      Outpatient Encounter Medications as of 06/30/2018  Medication Sig  . Budesonide-Formoterol Fumarate (SYMBICORT IN) Inhale 2 puffs into the lungs 2 (two) times daily. Symbicort-strength unknown   . Calcium Carbonate (CALCIUM 600 PO) Take by mouth. 1/2 by mouth two times daily    . citalopram (CELEXA) 20 MG tablet Take 20 mg by mouth daily.  . Coenzyme Q10 (COQ10) 100 MG CAPS Take by mouth daily.    Marland Kitchen diltiazem (CARDIZEM CD) 120 MG 24 hr capsule TAKE 1 CAPSULE EVERY DAY.  . diphenoxylate-atropine (LOMOTIL) 2.5-0.025 MG tablet Take 1 tablet by mouth every morning, then take additional tablet as needed up to 2 tablets three times daily.  Marland Kitchen FIBER PO Take 1 tablet by mouth daily.  . fluticasone (FLONASE) 50 MCG/ACT nasal spray Place 2 sprays into the nose 2 (two) times daily.   . Glycerin, Laxative, (ADULT  SUPPOSITORY RE) Place 1 suppository rectally as needed (constipation).  . hyoscyamine (LEVSIN SL) 0.125 MG SL tablet TAKE 1 TABLET UNDER TONGUE EVERY 8 HOURS AS NEEDED.  Marland Kitchen ibuprofen (ADVIL,MOTRIN) 600 MG tablet Take 1 tablet (600 mg total) by mouth every 8 (eight) hours as needed.  . levalbuterol (XOPENEX) 1.25 MG/3ML nebulizer solution Take 1 ampule by nebulization 2 (two) times daily.    Marland Kitchen levothyroxine (SYNTHROID, LEVOTHROID) 25 MCG tablet TAKE 1 TABLET ONCE DAILY.  Marland Kitchen lisinopril (PRINIVIL,ZESTRIL) 10 MG tablet TAKE 1 TABLET ONCE DAILY.  . montelukast (SINGULAIR) 10 MG tablet Take 10 mg by mouth at bedtime.  Marland Kitchen omeprazole (PRILOSEC) 40 MG capsule Take 40 mg by mouth daily.  . polyethylene glycol powder  (GLYCOLAX/MIRALAX) powder Take 17 g by mouth as needed.  . simvastatin (ZOCOR) 10 MG tablet TAKE ONE TABLET AT BEDTIME.  . traMADol (ULTRAM) 50 MG tablet Take by mouth every 6 (six) hours as needed.   No facility-administered encounter medications on file as of 06/30/2018.      REVIEW OF SYSTEMS  : All other systems reviewed and negative except where noted in the History of Present Illness.   PHYSICAL EXAM: BP 140/70 (BP Location: Left Arm, Patient Position: Sitting, Cuff Size: Normal)   Pulse 84   Ht 5\' 2"  (1.575 m)   Wt 132 lb 4 oz (60 kg)   BMI 24.19 kg/m  General: Well developed white female in no acute distress Head: Normocephalic and atraumatic Eyes:  Sclerae anicteric, conjunctiva pink. Ears: Normal auditory acuity Lungs: Clear throughout to auscultation; no increased WOB. Heart: Regular rate and rhythm; no M/R/G. Abdomen: Soft, non-distended.  BS present.  Minimal lower abdominal TTP. Musculoskeletal: Symmetrical with no gross deformities  Skin: No lesions on visible extremities Extremities: No edema  Neurological: Alert oriented x 4, grossly non-focal Psychological:  Alert and cooperative. Normal mood and affect  ASSESSMENT AND PLAN: *Long-standing IBS with loose stools with suspected pelvic floor dysfunction: Overall she seems to be doing very well with Lomotil and Bentyl as needed.  She seems satisfied with the results that she has seen with being able to use these medications.  She is asking for refills so we will do that.  I did find out a new piece of information today, the fact that she has been using daily fleets suppositories for the past 50+ years.  This seems to be out of a sense of security rather than necessity.  I do not know at this point that it will be worth breaking this habit for her.  We did once again discuss adding daily powder Benefiber or Citrucel to her regimen as well.  CC:  Maurice Small, MD  Addendum: Reviewed and agree with assessment and  management plan. Pyrtle, Lajuan Lines, MD

## 2018-07-01 ENCOUNTER — Telehealth: Payer: Self-pay | Admitting: *Deleted

## 2018-07-01 ENCOUNTER — Other Ambulatory Visit: Payer: Self-pay | Admitting: *Deleted

## 2018-07-01 MED ORDER — HYOSCYAMINE SULFATE 0.125 MG SL SUBL: 0.1250 mg | SUBLINGUAL_TABLET | Freq: Four times a day (QID) | SUBLINGUAL | 1 refills | Status: DC | PRN

## 2018-07-01 NOTE — Telephone Encounter (Signed)
[facility], advised the pharmacist that the PA told me the wrong medication. We sent a script for Bentyl and it was supposed to be Levsin 0.125 mg.  I sent that script electronically. The patient did not pick up the Bentyl.  They will cancel that script.

## 2018-07-01 NOTE — Telephone Encounter (Signed)
Advised the patient and advised her that I did a prior authorization for the Lomotil tablets with [employer].  They will let me know when they have made their decision as to whether they approve this medication or not.  I told her I would call her once I hear from them.

## 2018-07-02 ENCOUNTER — Telehealth: Payer: Self-pay | Admitting: Gastroenterology

## 2018-07-03 NOTE — Telephone Encounter (Signed)
The patient called me to let me know she does not want to pay 61.00 for the Lomotil.  I told her the insurance company denied the coverage of this medication. I , however did an appeal over the phone with [employer], [facility]).  I gave them the diagnosis code of IBS with diarrhea.  Also f gave them a list of medications she has tried and failed.  Levsin, Loperamide ( Immodium), Benefiber, Xifaxan, Bentyl.  I was told they will let me know the outcome in the next 72 hours ( business days).

## 2018-07-06 NOTE — Telephone Encounter (Signed)
See phone note from 07-01-2018.

## 2018-07-10 ENCOUNTER — Telehealth: Payer: Self-pay | Admitting: *Deleted

## 2018-07-10 NOTE — Telephone Encounter (Signed)
Advised the patient that I could not get the Lomotil approved.  She said she would not pay the $62,00 for the Lomitil. I told her I was sorry we could not get it approved.

## 2019-08-30 ENCOUNTER — Ambulatory Visit: Payer: Medicare Other | Attending: Internal Medicine

## 2019-08-30 DIAGNOSIS — Z23 Encounter for immunization: Secondary | ICD-10-CM | POA: Insufficient documentation

## 2019-08-30 NOTE — Progress Notes (Signed)
   Covid-19 Vaccination Clinic  Name:  Kanita Gutekunst    MRN: [id number] DOB: 1931/01/03  08/30/2019  Ms. Vanrossum was observed post Covid-19 immunization for 30 minutes based on pre-vaccination screening without incidence. She was provided with Vaccine Information Sheet and instruction to access the V-Safe system.   Ms. Mutchler was instructed to call 911 with any severe reactions post vaccine: Marland Kitchen Difficulty breathing  . Swelling of your face and throat  . A fast heartbeat  . A bad rash all over your body  . Dizziness and weakness

## 2019-09-20 ENCOUNTER — Ambulatory Visit: Payer: Medicare PPO | Attending: Internal Medicine

## 2019-09-20 DIAGNOSIS — Z23 Encounter for immunization: Secondary | ICD-10-CM | POA: Insufficient documentation

## 2019-09-20 NOTE — Progress Notes (Signed)
   Covid-19 Vaccination Clinic  Name:  Tanzy Stumbaugh    MRN: [id number] DOB: May 07, 1931  09/20/2019  Ms. Tocci was observed post Covid-19 immunization for 15 minutes without incidence. She was provided with Vaccine Information Sheet and instruction to access the V-Safe system.   Ms. Hugh was instructed to call 911 with any severe reactions post vaccine: Marland Kitchen Difficulty breathing  . Swelling of your face and throat  . A fast heartbeat  . A bad rash all over your body  . Dizziness and weakness    Immunizations Administered    Name Date Dose VIS Date Route   Pfizer COVID-19 Vaccine 09/20/2019 10:42 AM 0.3 mL 07/30/2019 Intramuscular   Manufacturer: [place]   Lot: [id number]   [place]: [id number]

## 2019-10-17 ENCOUNTER — Other Ambulatory Visit: Payer: Self-pay

## 2019-10-17 ENCOUNTER — Emergency Department (HOSPITAL_COMMUNITY): Payer: Medicare PPO

## 2019-10-17 ENCOUNTER — Emergency Department (HOSPITAL_COMMUNITY)
Admission: EM | Admit: 2019-10-17 | Discharge: 2019-10-18 | Disposition: A | Discharge status: Home / Self Care | Payer: Medicare PPO | Attending: Emergency Medicine | Admitting: Emergency Medicine

## 2019-10-17 ENCOUNTER — Emergency Department (HOSPITAL_BASED_OUTPATIENT_CLINIC_OR_DEPARTMENT_OTHER): Payer: Medicare PPO

## 2019-10-17 DIAGNOSIS — M79652 Pain in left thigh: Secondary | ICD-10-CM | POA: Insufficient documentation

## 2019-10-17 DIAGNOSIS — M79604 Pain in right leg: Secondary | ICD-10-CM

## 2019-10-17 DIAGNOSIS — Z20822 Contact with and (suspected) exposure to covid-19: Secondary | ICD-10-CM | POA: Diagnosis not present

## 2019-10-17 DIAGNOSIS — R519 Headache, unspecified: Secondary | ICD-10-CM | POA: Insufficient documentation

## 2019-10-17 DIAGNOSIS — J45909 Unspecified asthma, uncomplicated: Secondary | ICD-10-CM | POA: Diagnosis not present

## 2019-10-17 DIAGNOSIS — M79651 Pain in right thigh: Secondary | ICD-10-CM | POA: Insufficient documentation

## 2019-10-17 DIAGNOSIS — E039 Hypothyroidism, unspecified: Secondary | ICD-10-CM | POA: Diagnosis not present

## 2019-10-17 DIAGNOSIS — M542 Cervicalgia: Secondary | ICD-10-CM | POA: Insufficient documentation

## 2019-10-17 DIAGNOSIS — R0602 Shortness of breath: Secondary | ICD-10-CM | POA: Diagnosis not present

## 2019-10-17 DIAGNOSIS — I10 Essential (primary) hypertension: Secondary | ICD-10-CM | POA: Diagnosis not present

## 2019-10-17 DIAGNOSIS — R030 Elevated blood-pressure reading, without diagnosis of hypertension: Secondary | ICD-10-CM

## 2019-10-17 DIAGNOSIS — R079 Chest pain, unspecified: Secondary | ICD-10-CM | POA: Diagnosis present

## 2019-10-17 DIAGNOSIS — Z79899 Other long term (current) drug therapy: Secondary | ICD-10-CM | POA: Insufficient documentation

## 2019-10-17 LAB — COMPREHENSIVE METABOLIC PANEL
ALT: 19 U/L (ref 0–44)
AST: 25 U/L (ref 15–41)
Albumin: 3.9 g/dL (ref 3.5–5.0)
Alkaline Phosphatase: 61 U/L (ref 38–126)
Anion gap: 13 (ref 5–15)
BUN: 9 mg/dL (ref 8–23)
CO2: 24 mmol/L (ref 22–32)
Calcium: 9.1 mg/dL (ref 8.9–10.3)
Chloride: 95 mmol/L — ABNORMAL LOW (ref 98–111)
Creatinine, Ser: 0.78 mg/dL (ref 0.44–1.00)
GFR calc Af Amer: >60 mL/min (ref >60)
GFR calc non Af Amer: >60 mL/min (ref >60)
Glucose, Bld: 98 mg/dL (ref 70–99)
Potassium: 3.9 mmol/L (ref 3.5–5.1)
Sodium: 132 mmol/L — ABNORMAL LOW (ref 135–145)
Total Bilirubin: 0.6 mg/dL (ref 0.3–1.2)
Total Protein: 7 g/dL (ref 6.5–8.1)

## 2019-10-17 LAB — URINALYSIS, ROUTINE W REFLEX MICROSCOPIC
Bilirubin Urine: NEGATIVE
Glucose, UA: NEGATIVE mg/dL
Hgb urine dipstick: NEGATIVE
Ketones, ur: NEGATIVE mg/dL
Leukocytes,Ua: NEGATIVE
Nitrite: NEGATIVE
Protein, ur: NEGATIVE mg/dL
Specific Gravity, Urine: 1.005 (ref 1.005–1.030)
pH: 8 (ref 5.0–8.0)

## 2019-10-17 LAB — LIPASE, BLOOD: Lipase: 37 U/L (ref 11–51)

## 2019-10-17 LAB — CBC WITH DIFFERENTIAL/PLATELET
Abs Immature Granulocytes: 0.04 10*3/uL (ref 0.00–0.07)
Basophils Absolute: 0.1 10*3/uL (ref 0.0–0.1)
Basophils Relative: 1 %
Eosinophils Absolute: 0 10*3/uL (ref 0.0–0.5)
Eosinophils Relative: 0 %
HCT: 42.6 % (ref 36.0–46.0)
Hemoglobin: 14.4 g/dL (ref 12.0–15.0)
Immature Granulocytes: 0 %
Lymphocytes Relative: 19 %
Lymphs Abs: 2 10*3/uL (ref 0.7–4.0)
MCH: 31.9 pg (ref 26.0–34.0)
MCHC: 33.8 g/dL (ref 30.0–36.0)
MCV: 94.5 fL (ref 80.0–100.0)
Monocytes Absolute: 0.7 10*3/uL (ref 0.1–1.0)
Monocytes Relative: 6 %
Neutro Abs: 8.1 10*3/uL — ABNORMAL HIGH (ref 1.7–7.7)
Neutrophils Relative %: 74 %
Platelets: 267 10*3/uL (ref 150–400)
RBC: 4.51 MIL/uL (ref 3.87–5.11)
RDW: 13.2 % (ref 11.5–15.5)
WBC: 10.9 10*3/uL — ABNORMAL HIGH (ref 4.0–10.5)
nRBC: 0 % (ref 0.0–0.2)

## 2019-10-17 LAB — RESPIRATORY PANEL BY RT PCR (FLU A&B, COVID)
Influenza A by PCR: NEGATIVE
Influenza B by PCR: NEGATIVE
SARS Coronavirus 2 by RT PCR: NEGATIVE

## 2019-10-17 LAB — TROPONIN I (HIGH SENSITIVITY)
Troponin I (High Sensitivity): 7 ng/L (ref <18)
Troponin I (High Sensitivity): 11 ng/L (ref <18)

## 2019-10-17 LAB — LACTIC ACID, PLASMA
Lactic Acid, Venous: 1.1 mmol/L (ref 0.5–1.9)
Lactic Acid, Venous: 1.5 mmol/L (ref 0.5–1.9)

## 2019-10-17 LAB — D-DIMER, QUANTITATIVE: D-Dimer, Quant: 3.54 ug/mL-FEU — ABNORMAL HIGH (ref 0.00–0.50)

## 2019-10-17 LAB — BRAIN NATRIURETIC PEPTIDE: B Natriuretic Peptide: 98.1 pg/mL (ref 0.0–100.0)

## 2019-10-17 MED ORDER — LABETALOL HCL 5 MG/ML IV SOLN: 10.0000 mg | Freq: Once | INTRAVENOUS | Status: DC

## 2019-10-17 MED ORDER — IOHEXOL 350 MG/ML SOLN
75.0000 mL | Freq: Once | INTRAVENOUS | Status: AC | PRN
  Administered 2019-10-17: 75 mL via INTRAVENOUS

## 2019-10-17 MED ORDER — LABETALOL HCL 5 MG/ML IV SOLN
5.0000 mg | Freq: Once | INTRAVENOUS | Status: AC
  Administered 2019-10-17: 5 mg via INTRAVENOUS
  Filled 2019-10-17: qty 4

## 2019-10-17 MED ORDER — IOHEXOL 350 MG/ML SOLN
60.0000 mL | Freq: Once | INTRAVENOUS | Status: AC | PRN
  Administered 2019-10-17: 60 mL via INTRAVENOUS

## 2019-10-17 MED ORDER — LABETALOL HCL 5 MG/ML IV SOLN: 5.0000 mg | Freq: Once | INTRAVENOUS | Status: DC

## 2019-10-17 MED ORDER — LABETALOL HCL 5 MG/ML IV SOLN
2.5000 mg | Freq: Once | INTRAVENOUS | Status: AC
  Administered 2019-10-17: 2.5 mg via INTRAVENOUS
  Filled 2019-10-17: qty 4

## 2019-10-17 NOTE — ED Notes (Addendum)
Called pharmacy tech, denies that a med rec was done on this pt and therefore has not handled her medications; attempted to call [facility] without answer. Room searched, without luck. Pt adamant that she had a clear bag with her medications in it. Will continue to look.   Addend: Spoke with pts husband. He states the medications are at home.

## 2019-10-17 NOTE — ED Triage Notes (Signed)
Pt also c/o upper R leg posterior pain which has been going on for a month and difficult to WB intermittently.  Has been using Tylenol for this as well.  Does also have lightheadedness and dizziness which has came on gradual.  Alert and appropriate without acute weakness.

## 2019-10-17 NOTE — ED Provider Notes (Signed)
[facility] EMERGENCY DEPARTMENT Provider Note   CSN: [id number] Arrival date & time: 10/17/19  1559     History No chief complaint on file.   Valerie Decker is a 86 y.o. female.  The history is provided by the patient and medical records. No language interpreter was used.  Chest Pain Pain location:  Substernal area Pain quality: aching   Pain radiates to:  Does not radiate Pain severity:  Moderate Onset quality:  Gradual Duration:  1 month Timing:  Intermittent Progression:  Waxing and waning Relieved by:  Nothing Worsened by:  Nothing Ineffective treatments:  None tried Associated symptoms: headache and shortness of breath   Associated symptoms: no abdominal pain, no anxiety, no back pain, no cough, no diaphoresis, no fatigue, no fever, no heartburn, no nausea, no near-syncope, no numbness, no palpitations, no syncope and no vomiting   Risk factors: hypertension        Past Medical History:  Diagnosis Date   Allergic rhinitis, cause unspecified    Carpal tunnel syndrome    Diverticulosis    Elevated serum homocysteine level    Esophageal reflux    H. pylori infection 2010   Irritable bowel syndrome    Irritable bowel syndrome with diarrhea    Mixed hyperlipidemia    Neuralgia, neuritis, and radiculitis, unspecified    Osteopenia    Unspecified asthma(493.90)    Unspecified essential hypertension    Unspecified hypothyroidism    Unspecified sleep apnea    marginal; intolerant to CPAP    Patient Active Problem List   Diagnosis Date Noted   Loose stools 12/19/2016   Generalized abdominal pain 12/19/2016   Syncope and collapse 07/01/2012   Unspecified adverse effect of unspecified drug, medicinal and biological substance 03/12/2012   Pulmonary nodule 03/12/2012   OSTEOPENIA 06/05/2010   ALLERGIC RHINITIS 04/18/2009   GERD 11/08/2008   IRRITABLE BOWEL SYNDROME 10/21/2008   HYPERLIPIDEMIA 07/31/2007   SYMPTOM,  APNEA, SLEEP NOS 06/09/2007   HYPOTHYROIDISM 05/14/2007   HYPERTENSION, ESSENTIAL NOS 05/14/2007   RADICULOPATHY 01/21/2007   CARPAL TUNNEL SYNDROME 11/24/2006   ASTHMA 11/24/2006    Past Surgical History:  Procedure Laterality Date   BREAST BIOPSY     COLONOSCOPY     Tics   DILATION AND CURETTAGE OF UTERUS     facial plastic surgery     TONSILLECTOMY AND ADENOIDECTOMY     ? uvulectomy   UVULECTOMY       OB History   No obstetric history on file.     Family History  Problem Relation Age of Onset   Heart failure Mother        liver disease   Liver disease Mother    Stroke Maternal Aunt    COPD Maternal Aunt    Lung cancer Maternal Aunt    Asthma Maternal Grandfather    Colon cancer Neg Hx     Social History   Tobacco Use   Smoking status: Never Smoker   Smokeless tobacco: Never Used   Tobacco comment: Second hand smoke from mother & husband for decades  Substance Use Topics   Alcohol use: No    Alcohol/week: 0.0 standard drinks   Drug use: No    Home Medications Prior to Admission medications   Medication Sig Start Date End Date Taking? Authorizing Provider  Budesonide-Formoterol Fumarate (SYMBICORT IN) Inhale 2 puffs into the lungs 2 (two) times daily. Symbicort-strength unknown     [provider]  Calcium Carbonate (CALCIUM 600  PO) Take by mouth. 1/2 by mouth two times daily      [provider]  citalopram (CELEXA) 20 MG tablet Take 20 mg by mouth daily. 05/19/14   [provider]  Coenzyme Q10 (COQ10) 100 MG CAPS Take by mouth daily.      [provider]  dicyclomine (BENTYL) 10 MG capsule Take 1 capsule (10 mg total) by mouth 3 (three) times daily before meals. 06/30/18   Zehr, Laban Emperor, PA-C  diphenoxylate-atropine (LOMOTIL) 2.5-0.025 MG tablet Take 1 tablet by mouth every morning, then take additional tablet as needed up to 2 tablets three times daily. 06/30/18   Zehr, Laban Emperor, PA-C  FIBER  PO Take 1 tablet by mouth daily.    [provider]  fluticasone (FLONASE) 50 MCG/ACT nasal spray Place 2 sprays into the nose 2 (two) times daily.     [provider]  Glycerin, Laxative, (ADULT SUPPOSITORY RE) Place 1 suppository rectally as needed (constipation).    [provider]  hyoscyamine (LEVSIN SL) 0.125 MG SL tablet Take 1 tablet (0.125 mg total) by mouth every 6 (six) hours as needed. 07/01/18   Zehr, Laban Emperor, PA-C  ibuprofen (ADVIL,MOTRIN) 600 MG tablet Take 1 tablet (600 mg total) by mouth every 8 (eight) hours as needed. 06/10/14   Etta Quill, NP  levalbuterol Penne Lash) 1.25 MG/3ML nebulizer solution Take 1 ampule by nebulization 2 (two) times daily.      [provider]  levothyroxine (SYNTHROID, LEVOTHROID) 25 MCG tablet TAKE 1 TABLET ONCE DAILY. 09/17/13   Hendricks Limes, MD  lisinopril (PRINIVIL,ZESTRIL) 10 MG tablet TAKE 1 TABLET ONCE DAILY. 09/21/13   Hendricks Limes, MD  montelukast (SINGULAIR) 10 MG tablet Take 10 mg by mouth at bedtime.    [provider]  omeprazole (PRILOSEC) 40 MG capsule Take 40 mg by mouth daily.    [provider]  polyethylene glycol powder (GLYCOLAX/MIRALAX) powder Take 17 g by mouth as needed.    [provider]  simvastatin (ZOCOR) 10 MG tablet TAKE ONE TABLET AT BEDTIME. 09/21/13   Hendricks Limes, MD  traMADol (ULTRAM) 50 MG tablet Take by mouth every 6 (six) hours as needed.    [provider]    Allergies    Penicillins and Tape  Review of Systems   Review of Systems  Constitutional: Negative for chills, diaphoresis, fatigue and fever.  HENT: Negative for congestion and rhinorrhea.   Eyes: Negative for photophobia and visual disturbance.  Respiratory: Positive for shortness of breath. Negative for cough, chest tightness and wheezing.   Cardiovascular: Positive for chest pain. Negative for palpitations, leg swelling, syncope and near-syncope.  Gastrointestinal:  Negative for abdominal pain, constipation, diarrhea, heartburn, nausea and vomiting.  Genitourinary: Negative for dysuria, flank pain and frequency.  Musculoskeletal: Positive for neck pain. Negative for back pain and neck stiffness.  Skin: Negative for rash and wound.  Neurological: Positive for headaches. Negative for light-headedness and numbness.  Psychiatric/Behavioral: Negative for agitation and confusion.  All other systems reviewed and are negative.   Physical Exam Updated Vital Signs BP (!) 190/88 (BP Location: Right Arm)    Temp 97.8 F (36.6 C) (Oral)    Resp 18    Ht 5\' 3"  (1.6 m)    Wt 58.1 kg    SpO2 98%    BMI 22.67 kg/m   Physical Exam Vitals and nursing note reviewed.  Constitutional:      General: She is not in acute  distress.    Appearance: She is well-developed. She is not ill-appearing, toxic-appearing or diaphoretic.  HENT:     Head: Normocephalic and atraumatic.     Nose: Nose normal. No congestion or rhinorrhea.     Mouth/Throat:     Mouth: Mucous membranes are moist.     Pharynx: No oropharyngeal exudate or posterior oropharyngeal erythema.  Eyes:     Extraocular Movements: Extraocular movements intact.     Conjunctiva/sclera: Conjunctivae normal.     Pupils: Pupils are equal, round, and reactive to light.  Cardiovascular:     Rate and Rhythm: Normal rate and regular rhythm.     Pulses: Normal pulses.     Heart sounds: No murmur.  Pulmonary:     Effort: Pulmonary effort is normal. No respiratory distress.     Breath sounds: Normal breath sounds. No wheezing, rhonchi or rales.  Chest:     Chest wall: No tenderness.  Abdominal:     General: Abdomen is flat.     Palpations: Abdomen is soft.     Tenderness: There is no abdominal tenderness. There is no right CVA tenderness, left CVA tenderness, guarding or rebound.  Musculoskeletal:        General: No tenderness.     Cervical back: Neck supple. No tenderness.     Right lower leg: No edema.     Left  lower leg: No edema.  Skin:    General: Skin is warm and dry.     Capillary Refill: Capillary refill takes less than 2 seconds.     Findings: No erythema.  Neurological:     General: No focal deficit present.     Mental Status: She is alert and oriented to person, place, and time.     Sensory: No sensory deficit.     Motor: No weakness.     Coordination: Coordination normal.  Psychiatric:        Mood and Affect: Mood normal.     ED Results / Procedures / Treatments   Labs (all labs ordered are listed, but only abnormal results are displayed) Labs Reviewed  CBC WITH DIFFERENTIAL/PLATELET - Abnormal; Notable for the following components:      Result Value   WBC 10.9 (*)    Neutro Abs 8.1 (*)    All other components within normal limits  COMPREHENSIVE METABOLIC PANEL - Abnormal; Notable for the following components:   Sodium 132 (*)    Chloride 95 (*)    All other components within normal limits  D-DIMER, QUANTITATIVE (NOT AT [facility]) - Abnormal; Notable for the following components:   D-Dimer, Quant 3.54 (*)    All other components within normal limits  URINALYSIS, ROUTINE W REFLEX MICROSCOPIC - Abnormal; Notable for the following components:   Color, Urine STRAW (*)    All other components within normal limits  RESPIRATORY PANEL BY RT PCR (FLU A&B, COVID)  URINE CULTURE  LACTIC ACID, PLASMA  LACTIC ACID, PLASMA  LIPASE, BLOOD  BRAIN NATRIURETIC PEPTIDE  TROPONIN I (HIGH SENSITIVITY)  TROPONIN I (HIGH SENSITIVITY)    EKG EKG Interpretation  Date/Time:  Sunday October 17 2019 17:32:20 EST Ventricular Rate:  68 PR Interval:    QRS Duration: 86 QT Interval:  411 QTC Calculation: 438 R Axis:   52 Text Interpretation: Sinus rhythm When compared to prior, no significant changes seen. No STEMI Confirmed by Antony Blackbird [phone]) on 10/17/2019 8:14:55 PM   Radiology CT Angio Head W or Wo Contrast  Result Date: 10/17/2019  CLINICAL DATA:  Sudden onset of headache and  neck pain. Assess for vascular dissection. Hypertension. EXAM: CT ANGIOGRAPHY HEAD AND NECK TECHNIQUE: Multidetector CT imaging of the head and neck was performed using the standard protocol during bolus administration of intravenous contrast. Multiplanar CT image reconstructions and MIPs were obtained to evaluate the vascular anatomy. Carotid stenosis measurements (when applicable) are obtained utilizing NASCET criteria, using the distal internal carotid diameter as the denominator. CONTRAST:  80mL OMNIPAQUE IOHEXOL 350 MG/ML SOLN COMPARISON:  01/04/2012 FINDINGS: CT HEAD FINDINGS Brain: Age related atrophy. Chronic small-vessel ischemic changes of the hemispheric white matter. No sign of acute infarction, mass lesion, hemorrhage, hydrocephalus or extra-axial collection. Vascular: There is atherosclerotic calcification of the major vessels at the base of the brain. Skull: Negative Sinuses: Clear Orbits: Normal Review of the MIP images confirms the above findings CTA NECK FINDINGS Aortic arch: Aortic atherosclerosis. Branching pattern is normal without origin stenosis. Right carotid system: Common carotid artery widely patent to the bifurcation. Calcified plaque at the carotid bifurcation and ICA bulb but no stenosis. Cervical ICA widely patent. Left carotid system: Common carotid artery widely patent to the bifurcation. Calcified plaque at the carotid bifurcation and ICA bulb. No stenosis. Cervical ICA widely patent. Vertebral arteries: Left vertebral artery is dominant. Both vertebral artery origins are widely patent. No significant subclavian disease proximal to the vertebral origins. Both vertebral arteries appear normal through the cervical region to the foramen magnum. No evidence of vertebral dissection. Skeleton: Ordinary cervical spondylosis. Facet osteoarthritis on the right at C2-3 that could be painful. Degenerative change of the C1-2 articulation right more than left that could be painful. Chronic facet  and vertebral body fusion at C3-4. Other neck: No mass or lymphadenopathy. Upper chest: Benign pleural and parenchymal scarring at the apices. No active process. Review of the MIP images confirms the above findings CTA HEAD FINDINGS Anterior circulation: Both internal carotid arteries are patent through the skull base and siphon regions. There is atherosclerotic calcification affecting the carotid siphons but no stenosis greater than 30%. The anterior and middle cerebral vessels are patent without proximal stenosis, aneurysm or vascular malformation. No large or medium vessel occlusion is identified. Posterior circulation: Both vertebral arteries are widely patent through the foramen magnum to the basilar. No basilar stenosis. Posterior circulation branch vessels are patent and normal. Venous sinuses: Patent and normal. Anatomic variants: None significant. Review of the MIP images confirms the above findings IMPRESSION: No acute head CT finding. Age related atrophy and chronic small-vessel ischemic change of the white matter. No acute vascular finding.  No evidence of dissection. Ordinary atherosclerotic plaque at the carotid bifurcations but no stenosis. No intracranial large or medium vessel occlusion or correctable proximal stenosis. No aneurysm or vascular malformation. Electronically Signed   By: Nelson Chimes M.D.   On: 10/17/2019 19:31   CT Angio Neck W and/or Wo Contrast  Result Date: 10/17/2019 CLINICAL DATA:  Sudden onset of headache and neck pain. Assess for vascular dissection. Hypertension. EXAM: CT ANGIOGRAPHY HEAD AND NECK TECHNIQUE: Multidetector CT imaging of the head and neck was performed using the standard protocol during bolus administration of intravenous contrast. Multiplanar CT image reconstructions and MIPs were obtained to evaluate the vascular anatomy. Carotid stenosis measurements (when applicable) are obtained utilizing NASCET criteria, using the distal internal carotid diameter as  the denominator. CONTRAST:  83mL OMNIPAQUE IOHEXOL 350 MG/ML SOLN COMPARISON:  01/04/2012 FINDINGS: CT HEAD FINDINGS Brain: Age related atrophy. Chronic small-vessel ischemic changes of the hemispheric white  matter. No sign of acute infarction, mass lesion, hemorrhage, hydrocephalus or extra-axial collection. Vascular: There is atherosclerotic calcification of the major vessels at the base of the brain. Skull: Negative Sinuses: Clear Orbits: Normal Review of the MIP images confirms the above findings CTA NECK FINDINGS Aortic arch: Aortic atherosclerosis. Branching pattern is normal without origin stenosis. Right carotid system: Common carotid artery widely patent to the bifurcation. Calcified plaque at the carotid bifurcation and ICA bulb but no stenosis. Cervical ICA widely patent. Left carotid system: Common carotid artery widely patent to the bifurcation. Calcified plaque at the carotid bifurcation and ICA bulb. No stenosis. Cervical ICA widely patent. Vertebral arteries: Left vertebral artery is dominant. Both vertebral artery origins are widely patent. No significant subclavian disease proximal to the vertebral origins. Both vertebral arteries appear normal through the cervical region to the foramen magnum. No evidence of vertebral dissection. Skeleton: Ordinary cervical spondylosis. Facet osteoarthritis on the right at C2-3 that could be painful. Degenerative change of the C1-2 articulation right more than left that could be painful. Chronic facet and vertebral body fusion at C3-4. Other neck: No mass or lymphadenopathy. Upper chest: Benign pleural and parenchymal scarring at the apices. No active process. Review of the MIP images confirms the above findings CTA HEAD FINDINGS Anterior circulation: Both internal carotid arteries are patent through the skull base and siphon regions. There is atherosclerotic calcification affecting the carotid siphons but no stenosis greater than 30%. The anterior and middle  cerebral vessels are patent without proximal stenosis, aneurysm or vascular malformation. No large or medium vessel occlusion is identified. Posterior circulation: Both vertebral arteries are widely patent through the foramen magnum to the basilar. No basilar stenosis. Posterior circulation branch vessels are patent and normal. Venous sinuses: Patent and normal. Anatomic variants: None significant. Review of the MIP images confirms the above findings IMPRESSION: No acute head CT finding. Age related atrophy and chronic small-vessel ischemic change of the white matter. No acute vascular finding.  No evidence of dissection. Ordinary atherosclerotic plaque at the carotid bifurcations but no stenosis. No intracranial large or medium vessel occlusion or correctable proximal stenosis. No aneurysm or vascular malformation. Electronically Signed   By: Nelson Chimes M.D.   On: 10/17/2019 19:31   CT Angio Chest PE W and/or Wo Contrast  Result Date: 10/17/2019 CLINICAL DATA:  86 year old female with elevated D-dimer. Concern for pulmonary embolism. EXAM: CT ANGIOGRAPHY CHEST WITH CONTRAST TECHNIQUE: Multidetector CT imaging of the chest was performed using the standard protocol during bolus administration of intravenous contrast. Multiplanar CT image reconstructions and MIPs were obtained to evaluate the vascular anatomy. CONTRAST:  22mL OMNIPAQUE IOHEXOL 350 MG/ML SOLN COMPARISON:  Chest CT dated 09/22/2012. FINDINGS: Cardiovascular: Mild cardiomegaly with mild dilatation of the left atrium. No pericardial effusion. Coronary vascular calcification and moderate atherosclerotic calcification of the thoracic aorta. No CT evidence of pulmonary embolism. Mediastinum/Nodes: There is no hilar or mediastinal adenopathy. The esophagus is grossly unremarkable. No mediastinal fluid collection. Lungs/Pleura: Patchy areas of hazy airspace opacity throughout the lungs may represent atelectasis or areas of air trapping. Atypical  infection is less likely but not excluded. Clinical correlation is recommended. Multiple scattered bilateral pulmonary nodules measure up to 4 mm in the left upper lobe overall similar to prior CT. No lobar consolidation, pleural effusion, or pneumothorax. The central airways are patent. Upper Abdomen: A 15 mm indeterminate hypodense lesion in the superior pole of the spleen may represent a focal scarring, cyst, or hemangioma. Musculoskeletal: Osteopenia with degenerative changes  of the spine. No acute osseous pathology. Review of the MIP images confirms the above findings. IMPRESSION: 1. No acute intrathoracic pathology. No CT evidence of pulmonary embolism. 2. Scattered bilateral pulmonary nodules measuring up to 4 mm. No follow-up needed if patient is low-risk (and has no known or suspected primary neoplasm). Non-contrast chest CT can be considered in 12 months if patient is high-risk. This recommendation follows the consensus statement: Guidelines for Management of Incidental Pulmonary Nodules Detected on CT Images: From the Fleischner Society 2017; Radiology 2017; 284:228-243. 3. Aortic Atherosclerosis (ICD10-I70.0). Electronically Signed   By: Anner Crete M.D.   On: 10/17/2019 19:30   VAS [place] LOWER EXTREMITY VENOUS (DVT) (ONLY [facility] & [facility])  Result Date: 10/17/2019  Lower Venous DVTStudy Indications: Posterior thigh pain.  Comparison Study: No prior study on file Performing Technologist: Sharion Dove RVS  Examination Guidelines: A complete evaluation includes B-mode imaging, spectral Doppler, color Doppler, and power Doppler as needed of all accessible portions of each vessel. Bilateral testing is considered an integral part of a complete examination. Limited examinations for reoccurring indications may be performed as noted. The reflux portion of the exam is performed with the patient in reverse Trendelenburg.  +---------+---------------+---------+-----------+----------+--------------+  RIGHT      Compressibility Phasicity Spontaneity Properties Thrombus Aging  +---------+---------------+---------+-----------+----------+--------------+  CFV       Full            Yes       Yes                                    +---------+---------------+---------+-----------+----------+--------------+  SFJ       Full                                                             +---------+---------------+---------+-----------+----------+--------------+  FV Prox   Full                                                             +---------+---------------+---------+-----------+----------+--------------+  FV Mid    Full                                                             +---------+---------------+---------+-----------+----------+--------------+  FV Distal Full                                                             +---------+---------------+---------+-----------+----------+--------------+  PFV       Full                                                             +---------+---------------+---------+-----------+----------+--------------+  POP       Full            Yes       Yes                                    +---------+---------------+---------+-----------+----------+--------------+  PTV       Full                                                             +---------+---------------+---------+-----------+----------+--------------+  PERO      Full                                                             +---------+---------------+---------+-----------+----------+--------------+   +----+---------------+---------+-----------+----------+--------------+  LEFT Compressibility Phasicity Spontaneity Properties Thrombus Aging  +----+---------------+---------+-----------+----------+--------------+  CFV  Full            Yes       Yes                                    +----+---------------+---------+-----------+----------+--------------+     Summary: RIGHT: - No evidence of deep vein thrombosis in the lower extremity.  No indirect evidence of obstruction proximal to the inguinal ligament.  LEFT: - No evidence of common femoral vein obstruction.  *See table(s) above for measurements and observations.    Preliminary     Procedures Procedures (including critical care time)  Medications Ordered in ED Medications  labetalol (NORMODYNE) injection 5 mg (5 mg Intravenous Given 10/17/19 1803)  iohexol (OMNIPAQUE) 350 MG/ML injection 75 mL (75 mLs Intravenous Contrast Given 10/17/19 1921)  iohexol (OMNIPAQUE) 350 MG/ML injection 60 mL (60 mLs Intravenous Contrast Given 10/17/19 1923)  labetalol (NORMODYNE) injection 2.5 mg (2.5 mg Intravenous Given 10/17/19 2259)    ED Course  I have reviewed the triage vital signs and the nursing notes.  Pertinent labs & imaging results that were available during my care of the patient were reviewed by me and considered in my medical decision making (see chart for details).    MDM Rules/Calculators/A&P                      Valerie Decker is a 86 y.o. female with a past medical history significant for hypertension, hyperlipidemia, hypothyroidism, GERD, asthma, irritable bowel syndrome, and osteopenia who presents with 1 month of worsening elevated blood pressures, headache, neck pain, lightheadedness/dizziness, chest pain, shortness of breath, and right leg pain.  She reports that all the symptoms have been ongoing for the last few weeks but have acutely worsened over the last few hours.  She reports that 1 hour ago her headache worsened and is now a 5 or 6 out of 10 in severity.  She describes it as primarily in her occiput and in her right posterior neck.  She has not had the pain this bad before.  She reports her blood pressure was [id number] systolic and she was told to come  to the emergency department by her provider over video call.  She reports she is had nausea but no vomiting.  EMS gave her Zofran which slightly relieved her nausea.  Patient reports that she is not having any focal  neurologic deficits with no visual changes, speech difficulties, numbness, tingling, weakness of extremities.  She does report that for the last week she has had right posterior leg pain.  She denies any leg swelling.  No history of DVT or PE.  She does have a history of melanoma she reports.  She reports the left-sided chest pain is moderate and has been ongoing for the last few weeks.  She reports shortness of breath is worse when her blood pressure is high and her chest pain is ongoing.  She denies recent injury or trauma.  On exam, lungs are clear and left chest is tender to palpation.  Is nontender.  She has good pulses in all extremities.  She has surgical scar on her right arm from the melanoma excision.  She did not have tenderness on my exam of her leg but the pain was reported in her right posterior upper leg.  Good sensation and pulse in leg.  Patient had no focal neurologic deficits with normal finger-nose-finger testing, sensation, strength in extremities.  She had symmetric pupils with normal extraocular movements.  No tenderness over temporal areas.  Clinically I am somewhat concerned about hypertensive emergency causing her headache, neck pain, chest pain, shortness of breath.  With the pain acutely worsening over the last hour and the elevated blood pressures, we will get CTA of the head and neck to look for dissection or bleed.  We will also treat her elevated blood pressure with labetalol at this time.  We will get a work-up look for the cause of her chest pain shortness of breath with a D-dimer, troponin, x-ray, labs.  Will get ultrasound of her right leg.  Work-up returned overall reassuring.  Troponin negative x2.  Lactic acid negative x2.  Covid and flu test negative.  Urinalysis does not show infection and culture was sent.  CT scans of the chest, neck, and head did not show evidence of dissection, bleed, aneurysm, or other concerning findings on that.  The CT PE study did not show  blood clot.  There were some nodules seen which patient was informed of.  Patient's blood pressure improved after the blood pressure medicine and she was feeling much better.  I suspect that her elevated blood pressure contributed to her headache and her chest discomfort.  Patient was moderate for several hours and her blood pressure continues to improve.   Given her otherwise reassuring work-up and her improved symptoms and her improved blood pressure, we discussed admission versus going home and patient would rather go home.  She will call her doctor tomorrow to discuss further blood pressure management titration.  We also discuss starting another blood pressure medicine however given the lability of her blood pressure tonight including going down to the 130s with minimal intervention, I was concerned about hypotension if we started a new blood pressure medicine at discharge.  Patient agrees with this and will follow up as we discussed.  Patient also understands return precautions for any new or worsened symptoms.  She had no other questions or concerns and was feeling much better and was discharged in good condition with family.     Final Clinical Impression(s) / ED Diagnoses Final diagnoses:  Elevated blood pressure reading  Acute nonintractable headache,  unspecified headache type  Chest pain, unspecified type    Rx / DC Orders ED Discharge Orders    None      Clinical Impression: 1. Elevated blood pressure reading   2. Acute nonintractable headache, unspecified headache type   3. Chest pain, unspecified type     Disposition: Discharge  Condition: Good  I have discussed the results, Dx and Tx plan with the pt(& family if present). He/she/they expressed understanding and agree(s) with the plan. Discharge instructions discussed at great length. Strict return precautions discussed and pt &/or family have verbalized understanding of the instructions. No further questions at time of  discharge.    New Prescriptions   No medications on file    Follow Up: Maurice Small, MD [address] [place] [address] [place] [facility] [address] [id number] [address] [place]       Anav Lammert, Gwenyth Allegra, MD 10/18/19 [phone]

## 2019-10-17 NOTE — Discharge Instructions (Signed)
Your work-up today was overall reassuring.  We ended up getting imaging of your chest, head, and neck to rule out bleeding, dissection of your vessels, or blood clot in the lungs.  We also ultrasounded your leg and there is no blood clot in the leg.  Your CT imaging was overall reassuring and we did see some scattered pulmonary nodules which you need to follow-up with your primary doctor for further monitoring of.  Your other labs are reassuring.  Your blood pressure initially was 200 but then decreased after the blood pressure medicine.  Given the improvement in your symptoms and your reassuring work-up and your of her blood pressure, we had a shared decision-making conversation with you and we feel you are safe for discharge home to talk to your primary doctor in the next few days to discuss blood pressure medication changes.  I suspect the elevated blood pressure contributed to your symptoms tonight.  If any symptoms change or worsen, please return to the nearest emergency department.  Please rest and stay hydrated.

## 2019-10-17 NOTE — ED Triage Notes (Addendum)
1 hours hx of HA, nausea, dizziness.  Took BP at home 209/106 and took her PM BP meds.  States HA and nausea has been going on for a couple of weeks, worsening today.  EMS gave Zofran 4mg  which did relieve slightly her nausea.

## 2019-10-17 NOTE — Progress Notes (Signed)
VASCULAR LAB PRELIMINARY  PRELIMINARY  PRELIMINARY  PRELIMINARY  Right lower extremity venous duplex completed.    Preliminary report:  See CV proc for preliminary results.  Gave Dr. Sherry Ruffing results.   Kiernan Atkerson, RVT 10/17/2019, 5:23 PM

## 2019-10-18 LAB — URINE CULTURE: Culture: <10000 — AB

## 2019-11-05 ENCOUNTER — Other Ambulatory Visit: Payer: Self-pay | Admitting: Family Medicine

## 2019-11-05 ENCOUNTER — Ambulatory Visit
Admission: RE | Admit: 2019-11-05 | Discharge: 2019-11-05 | Disposition: A | Discharge status: Home / Self Care | Payer: Medicare PPO | Source: Ambulatory Visit | Attending: Family Medicine | Admitting: Family Medicine

## 2019-11-05 DIAGNOSIS — R042 Hemoptysis: Secondary | ICD-10-CM

## 2020-01-19 DIAGNOSIS — I10 Essential (primary) hypertension: Secondary | ICD-10-CM | POA: Diagnosis not present

## 2020-01-19 DIAGNOSIS — R399 Unspecified symptoms and signs involving the genitourinary system: Secondary | ICD-10-CM | POA: Diagnosis not present

## 2020-01-19 DIAGNOSIS — E039 Hypothyroidism, unspecified: Secondary | ICD-10-CM | POA: Diagnosis not present

## 2020-01-19 DIAGNOSIS — Z23 Encounter for immunization: Secondary | ICD-10-CM | POA: Diagnosis not present

## 2020-01-20 DIAGNOSIS — R399 Unspecified symptoms and signs involving the genitourinary system: Secondary | ICD-10-CM | POA: Diagnosis not present

## 2020-02-07 DIAGNOSIS — L853 Xerosis cutis: Secondary | ICD-10-CM | POA: Diagnosis not present

## 2020-02-07 DIAGNOSIS — Z85820 Personal history of malignant melanoma of skin: Secondary | ICD-10-CM | POA: Diagnosis not present

## 2020-02-07 DIAGNOSIS — L720 Epidermal cyst: Secondary | ICD-10-CM | POA: Diagnosis not present

## 2020-02-07 DIAGNOSIS — L298 Other pruritus: Secondary | ICD-10-CM | POA: Diagnosis not present

## 2020-02-07 DIAGNOSIS — Z85828 Personal history of other malignant neoplasm of skin: Secondary | ICD-10-CM | POA: Diagnosis not present

## 2020-02-16 ENCOUNTER — Encounter (HOSPITAL_COMMUNITY): Payer: Self-pay | Admitting: Emergency Medicine

## 2020-02-16 ENCOUNTER — Other Ambulatory Visit: Payer: Self-pay

## 2020-02-16 ENCOUNTER — Emergency Department (HOSPITAL_COMMUNITY)
Admission: EM | Admit: 2020-02-16 | Discharge: 2020-02-17 | Disposition: A | Discharge status: Home / Self Care | Payer: Medicare PPO | Attending: Emergency Medicine | Admitting: Emergency Medicine

## 2020-02-16 DIAGNOSIS — Z5321 Procedure and treatment not carried out due to patient leaving prior to being seen by health care provider: Secondary | ICD-10-CM | POA: Insufficient documentation

## 2020-02-16 DIAGNOSIS — I10 Essential (primary) hypertension: Secondary | ICD-10-CM | POA: Diagnosis not present

## 2020-02-16 LAB — CBC
HCT: 41.9 % (ref 36.0–46.0)
Hemoglobin: 14 g/dL (ref 12.0–15.0)
MCH: 31.3 pg (ref 26.0–34.0)
MCHC: 33.4 g/dL (ref 30.0–36.0)
MCV: 93.5 fL (ref 80.0–100.0)
Platelets: 284 10*3/uL (ref 150–400)
RBC: 4.48 MIL/uL (ref 3.87–5.11)
RDW: 13.5 % (ref 11.5–15.5)
WBC: 7.4 10*3/uL (ref 4.0–10.5)
nRBC: 0 % (ref 0.0–0.2)

## 2020-02-16 LAB — COMPREHENSIVE METABOLIC PANEL
ALT: 18 U/L (ref 0–44)
AST: 24 U/L (ref 15–41)
Albumin: 4 g/dL (ref 3.5–5.0)
Alkaline Phosphatase: 61 U/L (ref 38–126)
Anion gap: 12 (ref 5–15)
BUN: 8 mg/dL (ref 8–23)
CO2: 24 mmol/L (ref 22–32)
Calcium: 9.2 mg/dL (ref 8.9–10.3)
Chloride: 98 mmol/L (ref 98–111)
Creatinine, Ser: 0.77 mg/dL (ref 0.44–1.00)
GFR calc Af Amer: >60 mL/min (ref >60)
GFR calc non Af Amer: >60 mL/min (ref >60)
Glucose, Bld: 103 mg/dL — ABNORMAL HIGH (ref 70–99)
Potassium: 3.9 mmol/L (ref 3.5–5.1)
Sodium: 134 mmol/L — ABNORMAL LOW (ref 135–145)
Total Bilirubin: 0.8 mg/dL (ref 0.3–1.2)
Total Protein: 7 g/dL (ref 6.5–8.1)

## 2020-02-16 LAB — LIPASE, BLOOD: Lipase: 36 U/L (ref 11–51)

## 2020-02-16 MED ORDER — SODIUM CHLORIDE 0.9% FLUSH: 3.0000 mL | Freq: Once | INTRAVENOUS | Status: DC

## 2020-02-16 NOTE — ED Triage Notes (Signed)
Pt c/o HTN, shortness of breath, nausea and a headache. Hx HTN, states she has been taking her meds as prescribed. No neuro deficits noted.

## 2020-02-16 NOTE — ED Notes (Signed)
This RN called back into the triage room, pt reports she punctured her left leg on an aluminum chair, wound wrapped pta, dressing clean and intact.

## 2020-02-17 NOTE — ED Notes (Signed)
Pt leaving AMA. Advised to return if symptoms worsen. 

## 2020-02-18 DIAGNOSIS — I10 Essential (primary) hypertension: Secondary | ICD-10-CM | POA: Diagnosis not present

## 2020-03-03 DIAGNOSIS — Z5181 Encounter for therapeutic drug level monitoring: Secondary | ICD-10-CM | POA: Diagnosis not present

## 2020-03-15 ENCOUNTER — Ambulatory Visit: Payer: Medicare PPO | Admitting: Neurology

## 2020-03-15 ENCOUNTER — Encounter: Payer: Self-pay | Admitting: Neurology

## 2020-03-15 VITALS — BP 137/84 | HR 73 | Ht 63.0 in | Wt 130.0 lb

## 2020-03-15 DIAGNOSIS — M5431 Sciatica, right side: Secondary | ICD-10-CM

## 2020-03-15 DIAGNOSIS — M79651 Pain in right thigh: Secondary | ICD-10-CM | POA: Diagnosis not present

## 2020-03-15 DIAGNOSIS — R202 Paresthesia of skin: Secondary | ICD-10-CM

## 2020-03-15 NOTE — Patient Instructions (Signed)
I believe your symptoms of right-sided thigh pain and numbness could be coming from the back, symptoms could be from having right-sided sciatica which is a pinched nerve in the back.  I do not see any signs of widespread nerve damage or what we call neuropathy on your examination.  I do believe you would benefit from a lumbar spine/lower back MRI which I will order.  Your primary care physician, Dr. Justin Mend had mentioned it as well, I do not believe she ordered the test, we will go ahead and order this and talk to your insurance about authorization. We will then call you to schedule your MRI.  We will also call you with the results and take it from there.  You may benefit from seeing a spine specialist.

## 2020-03-15 NOTE — Progress Notes (Signed)
Subjective:    Patient ID: Valerie Decker is a 86 y.o. female.  HPI     Star Age, MD, PhD [facility] [address] [place], [place] [address]  Dear Dr. Justin Mend,   I saw your patient, Valerie Decker, upon your kind request, in my Neurologic clinic today for initial consultation of her right leg numbness.  The patient is accompanied by her husband today.  As you know, [name] is an 86 year old right-handed woman with an underlying medical history of hypertension, hypothyroidism, hyperlipidemia, asthma, history of melanoma, osteopenia, depression, reflux disease, and allergies, who reports an approximately 69-month history of pain in the posterior aspect of her right thigh, including burning sensation and also radiating pain down to the leg, the back of her leg down to the foot at times.  She reports numbness intermittently affecting the right leg, starting from the posterior right thigh.  She denies any significant weakness, she has not had any falls, no dragging of her leg, no tripping.  She has not had any foot drop.  She does report urinary incontinence.  She denies any sudden onset of one-sided weakness or numbness or tingling or droopy face or slurring of speech.  She has not had any falls or back injuries.  She does report that she tries to walk every day and tries to get 10,000 steps daily, 1 day she did notice a pain after she had walked, and her husband felt that she may have pulled the tendon.  She improved, she has intermittent symptoms, typically not when she sits down, typically with prolonged standing.  She denies any left-sided symptoms or upper body symptoms such as neck pain, radiating pain to the arms or chronic numbness in the hands or feet.  I reviewed your office note from 01/19/2020.  She was supposed to have a lumbar spine MRI done but reports that she never had the lumbar spine MRI.  She does recall that she was supposed to stop her  cholesterol medication to see if her symptoms improve but she is back on it.   She recently presented to the emergency room on 02/16/2020 with shortness of breath nausea and headache, but she did not stay.  She presented to the emergency room on 10/17/2019 with chest pain, headache and shortness of breath.  I reviewed the emergency room records.  She reported right leg pain at the time. She had multiple imaging tests including CT angiogram chest with contrast which did not show any evidence of PE but she had multiple pulmonary nodules.  She also had a head and neck CT angiogram with and without contrast on 10/17/2019 and I reviewed the results: IMPRESSION: No acute head CT finding. Age related atrophy and chronic small-vessel ischemic change of the white matter.   No acute vascular finding.  No evidence of dissection.   Ordinary atherosclerotic plaque at the carotid bifurcations but no stenosis.   No intracranial large or medium vessel occlusion or correctable proximal stenosis. No aneurysm or vascular malformation.    Her Past Medical History Is Significant For: Past Medical History:  Diagnosis Date   Allergic rhinitis, cause unspecified    Carpal tunnel syndrome    Diverticulosis    Elevated serum homocysteine level    Esophageal reflux    H. pylori infection 2010   Irritable bowel syndrome    Irritable bowel syndrome with diarrhea    Mixed hyperlipidemia    Neuralgia, neuritis, and radiculitis, unspecified  Osteopenia    Unspecified asthma(493.90)    Unspecified essential hypertension    Unspecified hypothyroidism    Unspecified sleep apnea    marginal; intolerant to CPAP    Her Past Surgical History Is Significant For: Past Surgical History:  Procedure Laterality Date   BREAST BIOPSY     COLONOSCOPY     Tics   DILATION AND CURETTAGE OF UTERUS     facial plastic surgery     TONSILLECTOMY AND ADENOIDECTOMY     ? uvulectomy   UVULECTOMY      Her  Family History Is Significant For: Family History  Problem Relation Age of Onset   Heart failure Mother        liver disease   Liver disease Mother 89   Stroke Maternal Aunt    COPD Maternal Aunt    Lung cancer Maternal Aunt    Asthma Maternal Grandfather    Colon cancer Neg Hx     Her Social History Is Significant For: Social History   Socioeconomic History   Marital status: Married    Spouse name: Not on file   Number of children: 2   Years of education: Not on file   Highest education level: Not on file  Occupational History   Not on file  Tobacco Use   Smoking status: Never Smoker   Smokeless tobacco: Never Used   Tobacco comment: Second hand smoke from mother & husband for decades  Substance and Sexual Activity   Alcohol use: No    Alcohol/week: 0.0 standard drinks    Comment: Occasionally has a glass of red wine with dinner   Drug use: No   Sexual activity: Not on file  Other Topics Concern   Not on file  Social History Narrative   Patient is married and lives at home with her husband. She has two children. She is retired. She reports she completed a 4 year degree.    Social Determinants of Health   Financial Resource Strain:    Difficulty of Paying Living Expenses:   Food Insecurity:    Worried About Charity fundraiser in the Last Year:    Arboriculturist in the Last Year:   Transportation Needs:    Film/video editor (Medical):    Lack of Transportation (Non-Medical):   Physical Activity:    Days of Exercise per Week:    Minutes of Exercise per Session:   Stress:    Feeling of Stress :   Social Connections:    Frequency of Communication with Friends and Family:    Frequency of Social Gatherings with Friends and Family:    Attends Religious Services:    Active Member of Clubs or Organizations:    Attends Archivist Meetings:    Marital Status:     Her Allergies Are:  Allergies  Allergen Reactions    Penicillins Diarrhea and Nausea And Vomiting   Tape Rash    Red skin after tape removed after surgery  :   Her Current Medications Are:  Outpatient Encounter Medications as of 03/15/2020  Medication Sig   Budesonide-Formoterol Fumarate (SYMBICORT IN) Inhale 2 puffs into the lungs 2 (two) times daily. Symbicort-strength unknown    Calcium Carbonate (CALCIUM 600 PO) Take by mouth. 1/2 by mouth two times daily     citalopram (CELEXA) 20 MG tablet Take 20 mg by mouth daily.   Coenzyme Q10 (COQ10) 100 MG CAPS Take by mouth daily.     FIBER  PO Take 1 tablet by mouth daily.   fluticasone (FLONASE) 50 MCG/ACT nasal spray Place 2 sprays into the nose 2 (two) times daily.    Glycerin, Laxative, (ADULT SUPPOSITORY RE) Place 1 suppository rectally as needed (constipation).   hyoscyamine (LEVSIN SL) 0.125 MG SL tablet Take 1 tablet (0.125 mg total) by mouth every 6 (six) hours as needed.   ibuprofen (ADVIL,MOTRIN) 600 MG tablet Take 1 tablet (600 mg total) by mouth every 8 (eight) hours as needed.   levalbuterol (XOPENEX) 1.25 MG/3ML nebulizer solution Take 1 ampule by nebulization 2 (two) times daily.     levothyroxine (SYNTHROID, LEVOTHROID) 25 MCG tablet TAKE 1 TABLET ONCE DAILY.   lisinopril (ZESTRIL) 20 MG tablet Take 20 mg by mouth daily.   montelukast (SINGULAIR) 10 MG tablet Take 10 mg by mouth at bedtime.   omeprazole (PRILOSEC) 40 MG capsule Take 40 mg by mouth daily.   polyethylene glycol powder (GLYCOLAX/MIRALAX) powder Take 17 g by mouth as needed.   simvastatin (ZOCOR) 10 MG tablet TAKE ONE TABLET AT BEDTIME.   traMADol (ULTRAM) 50 MG tablet Take by mouth every 6 (six) hours as needed.   [DISCONTINUED] dicyclomine (BENTYL) 10 MG capsule Take 1 capsule (10 mg total) by mouth 3 (three) times daily before meals.   [DISCONTINUED] diphenoxylate-atropine (LOMOTIL) 2.5-0.025 MG tablet Take 1 tablet by mouth every morning, then take additional tablet as needed up to 2  tablets three times daily.   [DISCONTINUED] lisinopril (PRINIVIL,ZESTRIL) 10 MG tablet TAKE 1 TABLET ONCE DAILY.   No facility-administered encounter medications on file as of 03/15/2020.  :   Review of Systems:  Out of a complete 14 point review of systems, all are reviewed and negative with the exception of these symptoms as listed below:  Review of Systems  Neurological:       Referred by Dr. Justin Mend for right leg numbness for the past 6 months. States the sensation occurs more when she is standing for long periods of time. Denies any injury to her leg or back.     Objective:  Neurological Exam  Physical Exam Physical Examination:   Vitals:   03/15/20 1448  BP: (!) 137/84  Pulse: 73   General Examination: The patient is a very pleasant 86 y.o. female in no acute distress. She appears well-developed and well-nourished and well groomed.   HEENT: Normocephalic, atraumatic, pupils are equal, round and reactive to light and accommodation. Funduscopic exam is normal with sharp disc margins noted. Extraocular tracking is good without limitation to gaze excursion or nystagmus noted. Normal smooth pursuit is noted. Hearing is grossly intact. Tympanic membranes are clear bilaterally. Face is symmetric with normal facial animation and normal facial sensation. Speech is clear with no dysarthria noted. There is no hypophonia. There is no lip, neck/head, jaw or voice tremor. Neck is supple with full range of passive and active motion. There are no carotid bruits on auscultation. Oropharynx exam reveals: moderate mouth dryness, adequate dental hygiene. Tongue protrudes centrally and palate elevates symmetrically.   Chest: Clear to auscultation without wheezing, rhonchi or crackles noted.  Heart: S1+S2+0, regular and normal without murmurs, rubs or gallops noted.   Abdomen: Soft, non-tender and non-distended with normal bowel sounds appreciated on auscultation.  Extremities: There is no pitting  edema in the distal lower extremities bilaterally. Pedal pulses are intact.  Skin: Warm and dry without trophic changes noted.  Musculoskeletal: exam reveals no obvious joint deformities, tenderness or joint swelling or erythema.   Neurologically:  Mental status: The patient is awake, alert and oriented in all 4 spheres. Her immediate and remote memory, attention, language skills and fund of knowledge are appropriate. There is no evidence of aphasia, agnosia, apraxia or anomia. Speech is clear with normal prosody and enunciation. Thought process is linear. Mood is normal and affect is normal.  Cranial nerves II - XII are as described above under HEENT exam. In addition: shoulder shrug is normal with equal shoulder height noted. Motor exam: Normal bulk, strength and tone is noted. There is no drift, tremor or rebound. Romberg is negative. Reflexes are 2+ throughout. Babinski: Toes are flexor bilaterally. Fine motor skills and coordination: intact with normal finger taps, normal hand movements, normal rapid alternating patting, normal foot taps and normal foot agility.  Cerebellar testing: No dysmetria or intention tremor on finger to nose testing. Heel to shin is unremarkable bilaterally. There is no truncal or gait ataxia.  Sensory exam: intact to light touch, pinprick, vibration, temperature sense in the upper and lower extremities.  Gait, station and balance: She stands easily. No veering to one side is noted. No leaning to one side is noted. Posture is age-appropriate and stance is narrow based. Gait shows normal stride length and normal pace. No problems turning are noted.   Assessment and Plan:    In summary, Jakari Jacot is a very pleasant 86 y.o.-year old female with an underlying medical history of hypertension, hypothyroidism, hyperlipidemia, asthma, history of melanoma, osteopenia, depression, reflux disease, and allergies, who presents for evaluation of her right-sided leg pain and  numbness, with intermittent symptoms for the past approximately 6 months.  Neurological exam is benign today, no telltale signs of peripheral neuropathy.  She is largely reassured in that regard.  She does not have any significant low back pain but symptoms could be in keeping with right-sided sciatica.  Given the location of her symptoms and the positional nature, I would recommend we proceed with a lumbar spine MRI with and without contrast.  I suggested we call her with her test results and take it from there, she may benefit from seeing a spine specialist.  She does not have any significant weakness on examination today and no significant symptoms currently which is reassuring.  She is advised that we will seek insurance authorization for her lumbar spine MRI and call her with the results.  I answered all her questions today and the patient and her husband were in agreement with the plan.   Thank you very much for allowing me to participate in the care of this nice patient. If I can be of any further assistance to you please do not hesitate to call me at [phone].  Sincerely,   Star Age, MD, PhD

## 2020-03-21 ENCOUNTER — Telehealth: Payer: Self-pay | Admitting: Neurology

## 2020-03-21 NOTE — Telephone Encounter (Signed)
Valerie Decker Valerie Decker: [id number]  (exp. 03/21/20 to 04/20/20) order sent to GI. They will reach out to the patient to schedule.

## 2020-03-22 DIAGNOSIS — E871 Hypo-osmolality and hyponatremia: Secondary | ICD-10-CM | POA: Diagnosis not present

## 2020-03-27 ENCOUNTER — Other Ambulatory Visit: Payer: Self-pay

## 2020-03-27 ENCOUNTER — Ambulatory Visit
Admission: RE | Admit: 2020-03-27 | Discharge: 2020-03-27 | Disposition: A | Discharge status: Home / Self Care | Payer: Medicare PPO | Source: Ambulatory Visit | Attending: Neurology | Admitting: Neurology

## 2020-03-27 DIAGNOSIS — M5431 Sciatica, right side: Secondary | ICD-10-CM

## 2020-03-27 DIAGNOSIS — M79651 Pain in right thigh: Secondary | ICD-10-CM

## 2020-03-27 DIAGNOSIS — R202 Paresthesia of skin: Secondary | ICD-10-CM

## 2020-03-27 DIAGNOSIS — M47816 Spondylosis without myelopathy or radiculopathy, lumbar region: Secondary | ICD-10-CM | POA: Diagnosis not present

## 2020-03-27 DIAGNOSIS — M48061 Spinal stenosis, lumbar region without neurogenic claudication: Secondary | ICD-10-CM | POA: Diagnosis not present

## 2020-03-27 MED ORDER — GADOBENATE DIMEGLUMINE 529 MG/ML IV SOLN
11.0000 mL | Freq: Once | INTRAVENOUS | Status: AC | PRN
  Administered 2020-03-27: 11 mL via INTRAVENOUS

## 2020-03-30 ENCOUNTER — Telehealth: Payer: Self-pay

## 2020-03-30 DIAGNOSIS — M5431 Sciatica, right side: Secondary | ICD-10-CM

## 2020-03-30 NOTE — Telephone Encounter (Signed)
-----   Message from Britt Bottom, MD sent at 03/28/2020  4:21 PM EDT ----- We could refer to [facility] imaging for an epidural steroid injection at L3-L4 towards the right.  If that does not help her, she could be referred to a spine surgeon (though at age 86 she may opt not to do)

## 2020-03-30 NOTE — Telephone Encounter (Signed)
I attempted to reach the pt. # would ring but not connect to a person or vm. Will try again at a later time.

## 2020-04-03 NOTE — Addendum Note (Signed)
Addended by: Verlin Grills on: 04/03/2020 02:25 PM   Modules accepted: Orders

## 2020-04-03 NOTE — Telephone Encounter (Addendum)
I reached out to the pt and her husband. They were both agreeable to the epidural spine injection but declined spine surgeon consult at this time. Order for injection has been placed.

## 2020-04-03 NOTE — Telephone Encounter (Signed)
Error

## 2020-04-12 DIAGNOSIS — E871 Hypo-osmolality and hyponatremia: Secondary | ICD-10-CM | POA: Diagnosis not present

## 2020-05-10 ENCOUNTER — Ambulatory Visit: Payer: Medicare PPO | Admitting: Gastroenterology

## 2020-05-10 ENCOUNTER — Encounter: Payer: Self-pay | Admitting: Gastroenterology

## 2020-05-10 VITALS — BP 140/72 | HR 83 | Ht 63.0 in | Wt 132.2 lb

## 2020-05-10 DIAGNOSIS — K589 Irritable bowel syndrome without diarrhea: Secondary | ICD-10-CM

## 2020-05-10 MED ORDER — HYOSCYAMINE SULFATE 0.125 MG SL SUBL: 0.1250 mg | SUBLINGUAL_TABLET | Freq: Four times a day (QID) | SUBLINGUAL | 11 refills | Status: DC | PRN

## 2020-05-10 NOTE — Patient Instructions (Addendum)
If you are age 84 or older, your body mass index should be between 23-30. Your Body mass index is 23.43 kg/m. If this is out of the aforementioned range listed, please consider follow up with your Primary Care Provider.  If you are age 107 or younger, your body mass index should be between 19-25. Your Body mass index is 23.43 kg/m. If this is out of the aformentioned range listed, please consider follow up with your Primary Care Provider.   Your hyoscyamine refills have been sent to your pharmacy.  Use a probiotic such as Florastar, Align or Culturalle  Follow up in 1 year or sooner if needed.

## 2020-05-10 NOTE — Progress Notes (Signed)
05/10/2020 Valerie Decker [id number] 1931-07-13   HISTORY OF PRESENT ILLNESS: This is a pleasant 86 year old female who is a patient of Dr. Vena Rua.  She has longstanding IBS with loose stools with suspected pelvic floor dysfunction.  She was last seen here by myself in November 2019.  She continues to use Lomotil and antispasmodics as needed.  She previously had used Bentyl, but then I gave her Levsin and she says that she likes it much better.  She still only uses those on occasion when she goes out about and travels.  She is asking for refills on the Levsin.  She is taking 2 tablespoons of Citrucel daily.  We have discussed adding a fiber supplement into her regimen at her last visit.  She continues to use frequent Fleet suppositories, which she has been doing for the past 50+ years.  She says that she uses these 1st thing in the morning if she does not have a bowel movement to make sure that she can clear her bowels before going out about for the day.  She says that she took align probiotic for years.  She talks a lot about her urinary incontinence and considering a bladder lift, etc. while at her visit today.  She says that her appetite is good.  Her weight is exactly the same as it was when she was here almost 2 years ago.  Past Medical History:  Diagnosis Date   Allergic rhinitis, cause unspecified    Asthma    Carpal tunnel syndrome    Diverticulosis    Elevated serum homocysteine level    Esophageal reflux    H. pylori infection 2010   Irritable bowel syndrome    Irritable bowel syndrome with diarrhea    Mixed hyperlipidemia    Neuralgia, neuritis, and radiculitis, unspecified    Osteopenia    Unspecified asthma(493.90)    Unspecified essential hypertension    Unspecified hypothyroidism    Unspecified sleep apnea    marginal; intolerant to CPAP   Past Surgical History:  Procedure Laterality Date   BREAST BIOPSY     COLONOSCOPY     Tics   DILATION  AND CURETTAGE OF UTERUS     facial plastic surgery     TONSILLECTOMY AND ADENOIDECTOMY     ? uvulectomy   UVULECTOMY      reports that she has never smoked. She has never used smokeless tobacco. She reports that she does not drink alcohol and does not use drugs. family history includes Asthma in her maternal grandfather; COPD in her maternal aunt; Heart failure in her mother; Liver disease (age of onset: 77) in her mother; Lung cancer in her maternal aunt; Stroke in her maternal aunt. Allergies  Allergen Reactions   Penicillins Diarrhea and Nausea And Vomiting   Tape Rash    Red skin after tape removed after surgery      Outpatient Encounter Medications as of 05/10/2020  Medication Sig   budesonide-formoterol (SYMBICORT) 160-4.5 MCG/ACT inhaler Inhale 1-2 puffs into the lungs 2 (two) times daily.   fluticasone (FLONASE) 50 MCG/ACT nasal spray Place 2 sprays into the nose daily.    [DISCONTINUED] Budesonide-Formoterol Fumarate (SYMBICORT IN) Inhale 2 puffs into the lungs 2 (two) times daily. Symbicort-strength unknown    Calcium Carbonate (CALCIUM 600 PO) Take by mouth. 1/2 by mouth two times daily     citalopram (CELEXA) 20 MG tablet Take 20 mg by mouth daily.   Coenzyme Q10 (COQ10) 100 MG CAPS Take  by mouth daily.     FIBER PO Take 1 tablet by mouth daily.   Glycerin, Laxative, (ADULT SUPPOSITORY RE) Place 1 suppository rectally as needed (constipation).   hyoscyamine (LEVSIN SL) 0.125 MG SL tablet Take 1 tablet (0.125 mg total) by mouth every 6 (six) hours as needed.   ibuprofen (ADVIL,MOTRIN) 600 MG tablet Take 1 tablet (600 mg total) by mouth every 8 (eight) hours as needed.   levalbuterol (XOPENEX) 1.25 MG/3ML nebulizer solution Take 1 ampule by nebulization 2 (two) times daily.     levothyroxine (SYNTHROID, LEVOTHROID) 25 MCG tablet TAKE 1 TABLET ONCE DAILY.   lisinopril (ZESTRIL) 20 MG tablet Take 20 mg by mouth daily.   montelukast (SINGULAIR) 10 MG tablet  Take 10 mg by mouth at bedtime.   omeprazole (PRILOSEC) 40 MG capsule Take 40 mg by mouth daily.   polyethylene glycol powder (GLYCOLAX/MIRALAX) powder Take 17 g by mouth as needed.   simvastatin (ZOCOR) 10 MG tablet TAKE ONE TABLET AT BEDTIME.   traMADol (ULTRAM) 50 MG tablet Take by mouth every 6 (six) hours as needed.   No facility-administered encounter medications on file as of 05/10/2020.     REVIEW OF SYSTEMS  : All other systems reviewed and negative except where noted in the History of Present Illness.   PHYSICAL EXAM: BP 140/72    Pulse 83    Ht 5\' 3"  (1.6 m)    Wt 132 lb 4 oz (60 kg)    BMI 23.43 kg/m  General: Well developed white female in no acute distress Head: Normocephalic and atraumatic Eyes:  Sclerae anicteric, conjunctiva pink. Ears: Normal auditory acuity Lungs: Clear throughout to auscultation; no W/R/R. Heart: Regular rate and rhythm; no M/R/G. Abdomen: Soft, non-distended.  BS present.  Non-tender. Musculoskeletal: Symmetrical with no gross deformities  Skin: No lesions on visible extremities Extremities: No edema  Neurological: Alert oriented x 4, grossly non-focal Psychological:  Alert and cooperative. Normal mood and affect  ASSESSMENT AND PLAN: *Long-standing IBS with loose stools with suspected pelvic floor dysfunction: Overall she seems to be doing very well with Lomotil and levsin as needed.  Will send prescription refill for levsin.  She is using 2 Tablespoons of Citrucel daily.  We discussed adding a probiotic back to her regimen as she had taken Align for years.  Can use Gas-X prn.  Samples of IBgard to use prn given as well.  Follow-up in a year or sooner if needed.  CC:  Maurice Small, MD

## 2020-05-19 NOTE — Progress Notes (Signed)
Addendum: Reviewed and agree with assessment and management plan. Regina Coppolino M, MD  

## 2020-05-25 DIAGNOSIS — M546 Pain in thoracic spine: Secondary | ICD-10-CM | POA: Diagnosis not present

## 2020-07-05 DIAGNOSIS — L03032 Cellulitis of left toe: Secondary | ICD-10-CM | POA: Diagnosis not present

## 2020-07-05 DIAGNOSIS — D692 Other nonthrombocytopenic purpura: Secondary | ICD-10-CM | POA: Diagnosis not present

## 2020-07-05 DIAGNOSIS — B351 Tinea unguium: Secondary | ICD-10-CM | POA: Diagnosis not present

## 2020-08-07 ENCOUNTER — Encounter (HOSPITAL_COMMUNITY): Payer: Self-pay | Admitting: Emergency Medicine

## 2020-08-07 ENCOUNTER — Emergency Department (HOSPITAL_COMMUNITY): Payer: Medicare PPO

## 2020-08-07 ENCOUNTER — Other Ambulatory Visit: Payer: Self-pay

## 2020-08-07 ENCOUNTER — Emergency Department (HOSPITAL_COMMUNITY)
Admission: EM | Admit: 2020-08-07 | Discharge: 2020-08-07 | Disposition: A | Discharge status: Home / Self Care | Payer: Medicare PPO | Attending: Emergency Medicine | Admitting: Emergency Medicine

## 2020-08-07 DIAGNOSIS — E039 Hypothyroidism, unspecified: Secondary | ICD-10-CM | POA: Insufficient documentation

## 2020-08-07 DIAGNOSIS — J069 Acute upper respiratory infection, unspecified: Secondary | ICD-10-CM | POA: Diagnosis not present

## 2020-08-07 DIAGNOSIS — I10 Essential (primary) hypertension: Secondary | ICD-10-CM | POA: Diagnosis not present

## 2020-08-07 DIAGNOSIS — Z79899 Other long term (current) drug therapy: Secondary | ICD-10-CM | POA: Diagnosis not present

## 2020-08-07 DIAGNOSIS — R61 Generalized hyperhidrosis: Secondary | ICD-10-CM | POA: Diagnosis not present

## 2020-08-07 DIAGNOSIS — J45909 Unspecified asthma, uncomplicated: Secondary | ICD-10-CM | POA: Insufficient documentation

## 2020-08-07 DIAGNOSIS — Z20822 Contact with and (suspected) exposure to covid-19: Secondary | ICD-10-CM | POA: Insufficient documentation

## 2020-08-07 DIAGNOSIS — G4489 Other headache syndrome: Secondary | ICD-10-CM | POA: Diagnosis not present

## 2020-08-07 DIAGNOSIS — B9789 Other viral agents as the cause of diseases classified elsewhere: Secondary | ICD-10-CM | POA: Diagnosis not present

## 2020-08-07 DIAGNOSIS — R059 Cough, unspecified: Secondary | ICD-10-CM | POA: Diagnosis present

## 2020-08-07 DIAGNOSIS — R519 Headache, unspecified: Secondary | ICD-10-CM | POA: Diagnosis not present

## 2020-08-07 DIAGNOSIS — R110 Nausea: Secondary | ICD-10-CM | POA: Diagnosis not present

## 2020-08-07 DIAGNOSIS — Z7951 Long term (current) use of inhaled steroids: Secondary | ICD-10-CM | POA: Diagnosis not present

## 2020-08-07 DIAGNOSIS — E871 Hypo-osmolality and hyponatremia: Secondary | ICD-10-CM | POA: Diagnosis not present

## 2020-08-07 DIAGNOSIS — R5381 Other malaise: Secondary | ICD-10-CM | POA: Diagnosis not present

## 2020-08-07 DIAGNOSIS — J9811 Atelectasis: Secondary | ICD-10-CM | POA: Diagnosis not present

## 2020-08-07 DIAGNOSIS — J80 Acute respiratory distress syndrome: Secondary | ICD-10-CM | POA: Diagnosis not present

## 2020-08-07 LAB — URINE CULTURE: Culture: <10000 — AB

## 2020-08-07 LAB — URINALYSIS, ROUTINE W REFLEX MICROSCOPIC
Bilirubin Urine: NEGATIVE
Glucose, UA: NEGATIVE mg/dL
Hgb urine dipstick: NEGATIVE
Ketones, ur: 20 mg/dL — AB
Leukocytes,Ua: NEGATIVE
Nitrite: NEGATIVE
Protein, ur: NEGATIVE mg/dL
Specific Gravity, Urine: 1.013 (ref 1.005–1.030)
pH: 5 (ref 5.0–8.0)

## 2020-08-07 LAB — CBC WITH DIFFERENTIAL/PLATELET
Abs Immature Granulocytes: 0.07 10*3/uL (ref 0.00–0.07)
Basophils Absolute: 0.1 10*3/uL (ref 0.0–0.1)
Basophils Relative: 1 %
Eosinophils Absolute: 0.1 10*3/uL (ref 0.0–0.5)
Eosinophils Relative: 1 %
HCT: 37.8 % (ref 36.0–46.0)
Hemoglobin: 12.9 g/dL (ref 12.0–15.0)
Immature Granulocytes: 1 %
Lymphocytes Relative: 16 %
Lymphs Abs: 1.7 10*3/uL (ref 0.7–4.0)
MCH: 31.3 pg (ref 26.0–34.0)
MCHC: 34.1 g/dL (ref 30.0–36.0)
MCV: 91.7 fL (ref 80.0–100.0)
Monocytes Absolute: 0.7 10*3/uL (ref 0.1–1.0)
Monocytes Relative: 7 %
Neutro Abs: 7.8 10*3/uL — ABNORMAL HIGH (ref 1.7–7.7)
Neutrophils Relative %: 74 %
Platelets: 301 10*3/uL (ref 150–400)
RBC: 4.12 MIL/uL (ref 3.87–5.11)
RDW: 12.8 % (ref 11.5–15.5)
WBC: 10.3 10*3/uL (ref 4.0–10.5)
nRBC: 0 % (ref 0.0–0.2)

## 2020-08-07 LAB — BASIC METABOLIC PANEL
Anion gap: 12 (ref 5–15)
BUN: 14 mg/dL (ref 8–23)
CO2: 20 mmol/L — ABNORMAL LOW (ref 22–32)
Calcium: 8.8 mg/dL — ABNORMAL LOW (ref 8.9–10.3)
Chloride: 96 mmol/L — ABNORMAL LOW (ref 98–111)
Creatinine, Ser: 0.57 mg/dL (ref 0.44–1.00)
GFR, Estimated: >60 mL/min (ref >60)
Glucose, Bld: 100 mg/dL — ABNORMAL HIGH (ref 70–99)
Potassium: 3.6 mmol/L (ref 3.5–5.1)
Sodium: 128 mmol/L — ABNORMAL LOW (ref 135–145)

## 2020-08-07 LAB — RESP PANEL BY RT-PCR (FLU A&B, COVID) ARPGX2
Influenza A by PCR: NEGATIVE
Influenza B by PCR: NEGATIVE
SARS Coronavirus 2 by RT PCR: NEGATIVE

## 2020-08-07 MED ORDER — SODIUM CHLORIDE 0.9 % IV BOLUS
500.0000 mL | Freq: Once | INTRAVENOUS | Status: AC
  Administered 2020-08-07: 500 mL via INTRAVENOUS

## 2020-08-07 NOTE — ED Triage Notes (Signed)
Pt arriving via [facility] for hypertension and headache.Pt has hx of hypertension and reports taking meds as ordered.

## 2020-08-07 NOTE — ED Provider Notes (Signed)
[facility] DEPT Provider Note   CSN: [id number] Arrival date & time: 08/07/20  0045     History Chief Complaint  Patient presents with  . Hypertension  . Headache    Valerie Decker is a 86 y.o. female.  Patient to ED for evaluation of cough, congestion/rhinorrhea, low grade fever for the past 2 days. She has been taking Mucinex with limited relief. Symptoms included a bilateral frontal headache that comes and goes. She reports nausea without vomiting, no diarrhea. She denies dysuria or frequency. No sick contacts. She has received her COVID and flu vaccines. She states she took her blood pressure tonight and it was very high which is what prompted ED evaluation tonight.    Hypertension Associated symptoms include headaches. Pertinent negatives include no chest pain, no abdominal pain and no shortness of breath.  Headache Associated symptoms: congestion, cough, fever and nausea   Associated symptoms: no abdominal pain, no diarrhea, no myalgias, no sore throat and no vomiting        Past Medical History:  Diagnosis Date  . Allergic rhinitis, cause unspecified   . Asthma   . Carpal tunnel syndrome   . Diverticulosis   . Elevated serum homocysteine level   . Esophageal reflux   . H. pylori infection 2010  . Irritable bowel syndrome   . Irritable bowel syndrome with diarrhea   . Mixed hyperlipidemia   . Neuralgia, neuritis, and radiculitis, unspecified   . Osteopenia   . Unspecified asthma(493.90)   . Unspecified essential hypertension   . Unspecified hypothyroidism   . Unspecified sleep apnea    marginal; intolerant to CPAP    Patient Active Problem List   Diagnosis Date Noted  . Loose stools 12/19/2016  . Generalized abdominal pain 12/19/2016  . Syncope and collapse 07/01/2012  . Unspecified adverse effect of unspecified drug, medicinal and biological substance 03/12/2012  . Pulmonary nodule 03/12/2012  . OSTEOPENIA 06/05/2010  .  ALLERGIC RHINITIS 04/18/2009  . GERD 11/08/2008  . IRRITABLE BOWEL SYNDROME 10/21/2008  . HYPERLIPIDEMIA 07/31/2007  . SYMPTOM, APNEA, SLEEP NOS 06/09/2007  . HYPOTHYROIDISM 05/14/2007  . HYPERTENSION, ESSENTIAL NOS 05/14/2007  . RADICULOPATHY 01/21/2007  . CARPAL TUNNEL SYNDROME 11/24/2006  . ASTHMA 11/24/2006    Past Surgical History:  Procedure Laterality Date  . BREAST BIOPSY    . COLONOSCOPY     Tics  . DILATION AND CURETTAGE OF UTERUS    . facial plastic surgery    . TONSILLECTOMY AND ADENOIDECTOMY     ? uvulectomy  . UVULECTOMY       OB History   No obstetric history on file.     Family History  Problem Relation Age of Onset  . Heart failure Mother        liver disease  . Liver disease Mother 23  . Stroke Maternal Aunt   . COPD Maternal Aunt   . Lung cancer Maternal Aunt   . Asthma Maternal Grandfather   . Colon cancer Neg Hx     Social History   Tobacco Use  . Smoking status: Never Smoker  . Smokeless tobacco: Never Used  . Tobacco comment: Second hand smoke from mother & husband for decades  Vaping Use  . Vaping Use: Never used  Substance Use Topics  . Alcohol use: No    Alcohol/week: 0.0 standard drinks    Comment: Occasionally has a glass of red wine with dinner  . Drug use: No    Home Medications Prior  to Admission medications   Medication Sig Start Date End Date Taking? Authorizing Provider  acetaminophen (TYLENOL) 500 MG tablet Take 500-1,000 mg by mouth every 6 (six) hours as needed.    [provider]  albuterol (ACCUNEB) 0.63 MG/3ML nebulizer solution Take 1 ampule by nebulization as needed for wheezing.    [provider]  albuterol (VENTOLIN HFA) 108 (90 Base) MCG/ACT inhaler Inhale 1 puff into the lungs 2 (two) times daily.    [provider]  budesonide-formoterol (SYMBICORT) 160-4.5 MCG/ACT inhaler Inhale 1-2 puffs into the lungs 2 (two) times daily.    [provider]  CALCIUM CITRATE-VITAMIN D  PO Take 2 tablets by mouth daily. Calcium citrate 400 iu calcium/630mg  vitamin d    [provider]  Cholecalciferol (VITAMIN D-3) 125 MCG (5000 UT) TABS Take 1 tablet by mouth daily.    [provider]  citalopram (CELEXA) 20 MG tablet Take 20 mg by mouth daily. 05/19/14   [provider]  Coenzyme Q10 (CO Q 10) 60 MG CAPS Take 1 capsule by mouth daily.    [provider]  Dicyclomine HCl (BENTYL PO) Take by mouth as needed.    [provider]  diltiazem (CARDIZEM CD) 120 MG 24 hr capsule 1 capsule daily. 03/23/20   [provider]  Diphenoxylate-Atropine (LOMOTIL PO) Take by mouth as needed.    [provider]  fluticasone (FLONASE) 50 MCG/ACT nasal spray Place 2 sprays into the nose daily.     [provider]  hyoscyamine (LEVSIN SL) 0.125 MG SL tablet Take 1 tablet (0.125 mg total) by mouth every 6 (six) hours as needed. 05/10/20   Zehr, Laban Emperor, PA-C  levocetirizine (XYZAL) 5 MG tablet Take 5 mg by mouth every evening.    [provider]  levothyroxine (SYNTHROID, LEVOTHROID) 25 MCG tablet TAKE 1 TABLET ONCE DAILY. 09/17/13   Hendricks Limes, MD  lisinopril (ZESTRIL) 40 MG tablet 1 tablet daily. 04/25/20   [provider]  Methylcellulose, Laxative, (CITRUCEL PO) Take by mouth daily. 2 teaspoons daily    [provider]  montelukast (SINGULAIR) 10 MG tablet Take 10 mg by mouth at bedtime.    [provider]  omeprazole (PRILOSEC) 40 MG capsule Take 40 mg by mouth daily.    [provider]  simvastatin (ZOCOR) 10 MG tablet TAKE ONE TABLET AT BEDTIME. 09/21/13   Hendricks Limes, MD  Turmeric (QC TUMERIC COMPLEX PO) Take 750 mg by mouth daily.    [provider]    Allergies    Penicillins and Tape  Review of Systems   Review of Systems  Constitutional: Positive for fever. Negative for appetite change and diaphoresis.  HENT: Positive for congestion and rhinorrhea.  Negative for sore throat.   Respiratory: Positive for cough. Negative for shortness of breath.   Cardiovascular: Negative for chest pain.  Gastrointestinal: Positive for nausea. Negative for abdominal pain, diarrhea and vomiting.  Genitourinary: Negative.   Musculoskeletal: Negative for myalgias.  Skin: Negative for rash.  Neurological: Positive for headaches.    Physical Exam Updated Vital Signs BP (!) 175/84   Pulse 94   Temp 98.4 F (36.9 C) (Oral)   Resp 16   SpO2 99%   Physical Exam Vitals and nursing note reviewed.  Constitutional:      General: She is not in acute distress.    Appearance: She is well-developed. She is not ill-appearing.  HENT:     Head: Normocephalic and atraumatic.  Mouth/Throat:     Mouth: Mucous membranes are moist.  Cardiovascular:     Rate and Rhythm: Normal rate and regular rhythm.     Heart sounds: No murmur heard.   Pulmonary:     Effort: Pulmonary effort is normal.     Breath sounds: No wheezing, rhonchi or rales.  Chest:     Chest wall: No tenderness.  Abdominal:     General: There is no distension.     Palpations: Abdomen is soft.     Tenderness: There is no abdominal tenderness.  Musculoskeletal:        General: No swelling. Normal range of motion.     Cervical back: Normal range of motion and neck supple.  Skin:    General: Skin is warm and dry.  Neurological:     Mental Status: She is alert and oriented to person, place, and time.     GCS: GCS eye subscore is 4. GCS verbal subscore is 5. GCS motor subscore is 6.     Motor: No weakness.     Coordination: Coordination normal.     ED Results / Procedures / Treatments   Labs (all labs ordered are listed, but only abnormal results are displayed) Labs Reviewed  URINE CULTURE  RESP PANEL BY RT-PCR (FLU A&B, COVID) ARPGX2  CBC WITH DIFFERENTIAL/PLATELET  BASIC METABOLIC PANEL  URINALYSIS, ROUTINE W REFLEX MICROSCOPIC   Results for orders placed or performed during the  hospital encounter of 08/07/20  Resp Panel by RT-PCR (Flu A&B, Covid) Nasopharyngeal Swab   Specimen: Nasopharyngeal Swab; Nasopharyngeal(NP) swabs in vial transport medium  Result Value Ref Range   SARS Coronavirus 2 by RT PCR NEGATIVE NEGATIVE   Influenza A by PCR NEGATIVE NEGATIVE   Influenza B by PCR NEGATIVE NEGATIVE  CBC with Differential  Result Value Ref Range   WBC 10.3 4.0 - 10.5 K/uL   RBC 4.12 3.87 - 5.11 MIL/uL   Hemoglobin 12.9 12.0 - 15.0 g/dL   HCT 37.8 36.0 - 46.0 %   MCV 91.7 80.0 - 100.0 fL   MCH 31.3 26.0 - 34.0 pg   MCHC 34.1 30.0 - 36.0 g/dL   RDW 12.8 11.5 - 15.5 %   Platelets 301 150 - 400 K/uL   nRBC 0.0 0.0 - 0.2 %   Neutrophils Relative % 74 %   Neutro Abs 7.8 (H) 1.7 - 7.7 K/uL   Lymphocytes Relative 16 %   Lymphs Abs 1.7 0.7 - 4.0 K/uL   Monocytes Relative 7 %   Monocytes Absolute 0.7 0.1 - 1.0 K/uL   Eosinophils Relative 1 %   Eosinophils Absolute 0.1 0.0 - 0.5 K/uL   Basophils Relative 1 %   Basophils Absolute 0.1 0.0 - 0.1 K/uL   Immature Granulocytes 1 %   Abs Immature Granulocytes 0.07 0.00 - 0.07 K/uL  Basic metabolic panel  Result Value Ref Range   Sodium 128 (L) 135 - 145 mmol/L   Potassium 3.6 3.5 - 5.1 mmol/L   Chloride 96 (L) 98 - 111 mmol/L   CO2 20 (L) 22 - 32 mmol/L   Glucose, Bld 100 (H) 70 - 99 mg/dL   BUN 14 8 - 23 mg/dL   Creatinine, Ser 0.57 0.44 - 1.00 mg/dL   Calcium 8.8 (L) 8.9 - 10.3 mg/dL   GFR, Estimated >60 >60 mL/min   Anion gap 12 5 - 15  Urinalysis, Routine w reflex microscopic Urine, Clean Catch  Result Value Ref Range   Color,  Urine YELLOW YELLOW   APPearance CLEAR CLEAR   Specific Gravity, Urine 1.013 1.005 - 1.030   pH 5.0 5.0 - 8.0   Glucose, UA NEGATIVE NEGATIVE mg/dL   Hgb urine dipstick NEGATIVE NEGATIVE   Bilirubin Urine NEGATIVE NEGATIVE   Ketones, ur 20 (A) NEGATIVE mg/dL   Protein, ur NEGATIVE NEGATIVE mg/dL   Nitrite NEGATIVE NEGATIVE   Leukocytes,Ua NEGATIVE NEGATIVE     EKG None  Radiology No results found. DG Chest Portable 1 View  Result Date: 08/07/2020 CLINICAL DATA:  Hypertension and headache. EXAM: PORTABLE CHEST 1 VIEW COMPARISON:  November 05, 2019 FINDINGS: Very mild atelectasis is seen within the bilateral lung bases. There is no evidence of a pleural effusion or pneumothorax. The heart size and mediastinal contours are within normal limits. Degenerative changes are seen throughout the thoracic spine. IMPRESSION: Very mild bibasilar atelectasis. Electronically Signed   By: Virgina Norfolk M.D.   On: 08/07/2020 02:33    Procedures Procedures (including critical care time)  Medications Ordered in ED Medications - No data to display  ED Course  I have reviewed the triage vital signs and the nursing notes.  Pertinent labs & imaging results that were available during my care of the patient were reviewed by me and considered in my medical decision making (see chart for details).    MDM Rules/Calculators/A&P                          Patient to ED with ss/sxs as per HPI. She is well appearing, in NAD.   Blood pressure in the ED is 175/84. No tachycardia, hypoxia. Will start work up for URI/pneumonia including viral testing.   Found to have hyponatremia of 128. Small fluid bolus NS ordered. CXR negative for infection. Labs reassuring. COVID/flu negative.   On re-evaluation the patient reports she feels much better. VSS. Blood pressure has been mildly elevated in ED but not concerning. She can be discharged home to primary care follow up.   Final Clinical Impression(s) / ED Diagnoses Final diagnoses:  None   1. URI  Rx / DC Orders ED Discharge Orders    None       Charlann Lange, PA-C 08/07/20 2119    Maudie Flakes, MD 08/07/20 0500

## 2020-08-07 NOTE — Discharge Instructions (Addendum)
Follow up with your doctor as planned later today.   If symptoms worsen or if new symptoms of concern develop, please return to the emergency department.

## 2020-09-22 DIAGNOSIS — E039 Hypothyroidism, unspecified: Secondary | ICD-10-CM | POA: Diagnosis not present

## 2020-09-22 DIAGNOSIS — J4520 Mild intermittent asthma, uncomplicated: Secondary | ICD-10-CM | POA: Diagnosis not present

## 2020-09-22 DIAGNOSIS — F330 Major depressive disorder, recurrent, mild: Secondary | ICD-10-CM | POA: Diagnosis not present

## 2020-09-22 DIAGNOSIS — E782 Mixed hyperlipidemia: Secondary | ICD-10-CM | POA: Diagnosis not present

## 2020-09-22 DIAGNOSIS — I10 Essential (primary) hypertension: Secondary | ICD-10-CM | POA: Diagnosis not present

## 2020-09-22 DIAGNOSIS — Z0000 Encounter for general adult medical examination without abnormal findings: Secondary | ICD-10-CM | POA: Diagnosis not present

## 2020-11-10 DIAGNOSIS — K219 Gastro-esophageal reflux disease without esophagitis: Secondary | ICD-10-CM | POA: Diagnosis not present

## 2020-11-10 DIAGNOSIS — H1045 Other chronic allergic conjunctivitis: Secondary | ICD-10-CM | POA: Diagnosis not present

## 2020-11-10 DIAGNOSIS — J4530 Mild persistent asthma, uncomplicated: Secondary | ICD-10-CM | POA: Diagnosis not present

## 2020-11-10 DIAGNOSIS — J3089 Other allergic rhinitis: Secondary | ICD-10-CM | POA: Diagnosis not present

## 2020-11-13 ENCOUNTER — Encounter: Payer: Self-pay | Admitting: Cardiology

## 2020-11-13 ENCOUNTER — Ambulatory Visit: Payer: Medicare PPO | Admitting: Cardiology

## 2020-11-13 ENCOUNTER — Other Ambulatory Visit: Payer: Self-pay

## 2020-11-13 VITALS — BP 122/78 | HR 95 | Ht 63.0 in | Wt 131.2 lb

## 2020-11-13 DIAGNOSIS — I10 Essential (primary) hypertension: Secondary | ICD-10-CM | POA: Diagnosis not present

## 2020-11-13 DIAGNOSIS — R0600 Dyspnea, unspecified: Secondary | ICD-10-CM

## 2020-11-13 DIAGNOSIS — E7800 Pure hypercholesterolemia, unspecified: Secondary | ICD-10-CM | POA: Diagnosis not present

## 2020-11-13 DIAGNOSIS — Z7189 Other specified counseling: Secondary | ICD-10-CM

## 2020-11-13 DIAGNOSIS — R0609 Other forms of dyspnea: Secondary | ICD-10-CM

## 2020-11-13 NOTE — Progress Notes (Signed)
Cardiology Office Note:    Date:  11/13/2020   ID:  Valerie Decker, DOB [birth year], MRN [id number]  PCP:  Valerie Small, MD  Cardiologist:  Valerie Dresser, MD  Referring MD: Valerie Small, MD   CC: new patient evaluation for dyspnea on exertion.  History of Present Illness:    Valerie Decker is a 87 y.o. female with a hx of hypertension, hyperlipidemia, hypothyroidism who is seen as a new consult at the request of Valerie Small, MD for the evaluation and management of dyspnea on exertion.  Note from Dr. Justin Decker dated 09/22/20 reviewed. No acute concerns noted at that visit, but in the plan there is a comment regarding ruling out cardiac cause of dyspnea on exertion.  Today: Daughter had a 90% blockage, had a stent put in. She is 85 years old. Her symptom was shortness of breath, worried that she has a blockage as well. No chest pain.   Started to notice shortness of breath just before winter. Notes shortness of breath with talking, walking. Has had asthma since she was young, has inhalers she uses at home. Shortness of breath improves with use of inhaler.  Cardiovascular risk factors: Prior clinical ASCVD: none Comorbid conditions: Endorses hypertension, hyperlipidemia. Denies diabetes, chronic kidney disease Metabolic syndrome/Obesity: none Chronic inflammatory conditions: none Tobacco use history: none Family history: daughter with CAD and stent age 27. First husband died suddenly at age 11. No other heart disease or stroke that she is aware of. Prior cardiac testing and/or incidental findings on other testing (ie coronary calcium): none Exercise level: Used to walk several thousand steps/day in her house, but has been largely sedentary this winter. Current diet: Eats chicken, cereal, eggs, bacon, bread. Avoids lactose milk.  Denies chest pain. No PND, orthopnea, LE edema or unexpected weight gain. No syncope or palpitations.  Saw Dr. Donneta Decker last week (allergy & immunology). Has not  seen pulmonologist.  Past Medical History:  Diagnosis Date   Allergic rhinitis, cause unspecified    Asthma    Carpal tunnel syndrome    Diverticulosis    Elevated serum homocysteine level    Esophageal reflux    H. pylori infection 2010   Irritable bowel syndrome    Irritable bowel syndrome with diarrhea    Mixed hyperlipidemia    Neuralgia, neuritis, and radiculitis, unspecified    Osteopenia    Unspecified asthma(493.90)    Unspecified essential hypertension    Unspecified hypothyroidism    Unspecified sleep apnea    marginal; intolerant to CPAP    Past Surgical History:  Procedure Laterality Date   BREAST BIOPSY     COLONOSCOPY     Tics   DILATION AND CURETTAGE OF UTERUS     facial plastic surgery     TONSILLECTOMY AND ADENOIDECTOMY     ? uvulectomy   UVULECTOMY      Current Medications: Current Outpatient Medications on File Prior to Visit  Medication Sig   acetaminophen (TYLENOL) 500 MG tablet Take 500-1,000 mg by mouth every 6 (six) hours as needed.   albuterol (ACCUNEB) 0.63 MG/3ML nebulizer solution Take 1 ampule by nebulization as needed for wheezing.   albuterol (VENTOLIN HFA) 108 (90 Base) MCG/ACT inhaler Inhale 1 puff into the lungs 2 (two) times daily.   budesonide-formoterol (SYMBICORT) 160-4.5 MCG/ACT inhaler Inhale 1-2 puffs into the lungs 2 (two) times daily.   CALCIUM CITRATE-VITAMIN D PO Take 2 tablets by mouth daily. Calcium citrate 400 iu calcium/630mg  vitamin d   Cholecalciferol (VITAMIN D-3) 125  MCG (5000 UT) TABS Take 1 tablet by mouth daily.   citalopram (CELEXA) 20 MG tablet Take 20 mg by mouth daily.   Coenzyme Q10 (CO Q 10) 60 MG CAPS Take 1 capsule by mouth daily.   Dicyclomine HCl (BENTYL PO) Take by mouth as needed.   diltiazem (CARDIZEM CD) 120 MG 24 hr capsule 1 capsule daily.   Diphenoxylate-Atropine (LOMOTIL PO) Take by mouth as needed.   fluticasone (FLONASE) 50 MCG/ACT nasal spray Place 2 sprays  into the nose daily.   hyoscyamine (LEVSIN SL) 0.125 MG SL tablet Take 1 tablet (0.125 mg total) by mouth every 6 (six) hours as needed.   levocetirizine (XYZAL) 5 MG tablet Take 5 mg by mouth every evening.   levothyroxine (SYNTHROID, LEVOTHROID) 25 MCG tablet TAKE 1 TABLET ONCE DAILY.   lisinopril (ZESTRIL) 40 MG tablet 1 tablet daily.   Methylcellulose, Laxative, (CITRUCEL PO) Take by mouth daily. 2 teaspoons daily   montelukast (SINGULAIR) 10 MG tablet Take 10 mg by mouth at bedtime.   omeprazole (PRILOSEC) 40 MG capsule Take 40 mg by mouth daily.   simvastatin (ZOCOR) 10 MG tablet TAKE ONE TABLET AT BEDTIME.   Turmeric (QC TUMERIC COMPLEX PO) Take 750 mg by mouth daily.   No current facility-administered medications on file prior to visit.     Allergies:   Penicillins and Tape   Social History   Tobacco Use   Smoking status: Never Smoker   Smokeless tobacco: Never Used   Tobacco comment: Second hand smoke from mother & husband for decades  Vaping Use   Vaping Use: Never used  Substance Use Topics   Alcohol use: No    Alcohol/week: 0.0 standard drinks    Comment: Occasionally has a glass of red wine with dinner   Drug use: No    Family History: family history includes Asthma in her maternal grandfather; COPD in her maternal aunt; Heart failure in her mother; Liver disease (age of onset: 87) in her mother; Lung cancer in her maternal aunt; Stroke in her maternal aunt. There is no history of Colon cancer.  ROS:   Please see the history of present illness.  Additional pertinent ROS: Constitutional: Negative for chills, fever, night sweats, unintentional weight loss  HENT: Negative for ear pain and hearing loss.   Eyes: Negative for loss of vision and eye pain.  Respiratory: Negative for cough, sputum, wheezing.   Cardiovascular: See HPI. Gastrointestinal: Negative for abdominal pain, melena, and hematochezia.  Genitourinary: Negative for dysuria and  hematuria.  Musculoskeletal: Negative for falls and myalgias.  Skin: Negative for itching and rash.  Neurological: Negative for focal weakness, focal sensory changes and loss of consciousness.  Endo/Heme/Allergies: Does not bruise/bleed easily.     EKGs/Labs/Other Studies Reviewed:    The following studies were reviewed today: No prior cardiac studies  EKG:  EKG is personally reviewed.  The ekg ordered today demonstrates NSR at 95 bpm  Recent Labs: 02/16/2020: ALT 18 08/07/2020: BUN 14; Creatinine, Ser 0.57; Hemoglobin 12.9; Platelets 301; Potassium 3.6; Sodium 128  Recent Lipid Panel    Component Value Date/Time   CHOL 221 (H) 09/08/2012 1024   TRIG 100.0 09/08/2012 1024   TRIG 114 06/19/2006 1157   HDL 72.60 09/08/2012 1024   CHOLHDL 3 09/08/2012 1024   VLDL 20.0 09/08/2012 1024   LDLCALC 108 (H) 09/18/2010 1004   LDLDIRECT 119.0 09/08/2012 1024    Physical Exam:    VS:  BP 122/78    Pulse  95    Ht 5\' 3"  (1.6 m)    Wt 131 lb 3.2 oz (59.5 kg)    SpO2 93%    BMI 23.24 kg/m     Wt Readings from Last 3 Encounters:  11/13/20 131 lb 3.2 oz (59.5 kg)  08/07/20 133 lb (60.3 kg)  05/10/20 132 lb 4 oz (60 kg)    GEN: Well nourished, well developed in no acute distress HEENT: Normal, moist mucous membranes NECK: No JVD CARDIAC: regular rhythm, normal S1 and S2, no rubs or gallops. 1/6 systolic murmur. VASCULAR: Radial and DP pulses 2+ bilaterally. No carotid bruits RESPIRATORY:  Clear to auscultation but diffuse mild velcro crackles ABDOMEN: Soft, non-tender, non-distended MUSCULOSKELETAL:  Ambulates independently SKIN: Warm and dry, no edema NEUROLOGIC:  Alert and oriented x 3. No focal neuro deficits noted. PSYCHIATRIC:  Normal affect    ASSESSMENT:    1. Dyspnea on exertion   2. Essential hypertension   3. Pure hypercholesterolemia   4. Cardiac risk counseling   5. Counseling on health promotion and disease prevention    PLAN:    Dyspnea on exertion: -will  order echocardiogram to evaluate  -discussed treadmill stress, nuclear stress/lexiscan, and CT coronary angiography. Discussed pros and cons of each, including but not limited to false positive/false negative risk, radiation risk, and risk of IV contrast dye. Based on shared decision making, decision was made to pursue lexiscan myoview stress test -counseled on red flag warning signs that need immediate medical attention.  Velcro crackles: -has not been seen by pulmonology that she is aware of. If cardiac workup unremarkable, would consider PFTs/high res CT and pulm referral, ?ILD  Hypercholesterolemia: -tolerating simvastatin, continue  Hypertension: -continue diltiazem, lisinopril  Cardiac risk counseling and prevention recommendations: -recommend heart healthy/Mediterranean diet, with whole grains, fruits, vegetable, fish, lean meats, nuts, and olive oil. Limit salt. -recommend moderate walking, 3-5 times/week for 30-50 minutes each session. Aim for at least 150 minutes.week. Goal should be pace of 3 miles/hours, or walking 1.5 miles in 30 minutes -recommend avoidance of tobacco products. Avoid excess alcohol. -Additional risk factor control:  -Diabetes risk: A1c is  -Lipids:   -Blood pressure control:  -Weight:  -ASCVD risk score: The ASCVD Risk score Mikey Bussing DC Jr., et al., 2013) failed to calculate for the following reasons:   The 2013 ASCVD risk score is only valid for ages 44 to 35    Plan for follow up: if cardiac tests unrevealing, would consider pulm workup/referral  Valerie Dresser, MD, PhD, [facility]    Medication Adjustments/Labs and Tests Ordered: Current medicines are reviewed at length with the patient today.  Concerns regarding medicines are outlined above.  Orders Placed This Encounter  Procedures   Cardiac Stress Test: Informed Consent Details: Physician/Practitioner Attestation; Transcribe to consent form and obtain patient  signature   MYOCARDIAL PERFUSION IMAGING   EKG 12-Lead   ECHOCARDIOGRAM COMPLETE   No orders of the defined types were placed in this encounter.   Patient Instructions  Medication Instructions:  Your Physician recommend you continue on your current medication as directed.    *If you need a refill on your cardiac medications before your next appointment, please call your pharmacy*   Lab Work: None   Testing/Procedures: Your physician has requested that you have an echocardiogram. Echocardiography is a painless test that uses sound waves to create images of your heart. It provides your doctor with information about the size and shape of your heart  and how well your hearts chambers and valves are working. This procedure takes approximately one hour. There are no restrictions for this procedure. [place] has requested that you have a lexiscan myoview. For further information please visit [email or web address]. Please follow instruction sheet, as given. [place]. [address]    Follow-Up: At [facility], you and your health needs are our priority.  As part of our continuing mission to provide you with exceptional heart care, we have created designated Provider Care Teams.  These Care Teams include your primary Cardiologist (physician) and Advanced Practice Providers (APPs -  Physician Assistants and Nurse Practitioners) who all work together to provide you with the care you need, when you need it.  We recommend signing up for the patient portal called "MyChart".  Sign up information is provided on this After Visit Summary.  MyChart is used to connect with patients for Virtual Visits (Telemedicine).  Patients are able to view lab/test results, encounter notes, upcoming appointments, etc.  Non-urgent messages can be sent to your provider as well.   To learn more about what you can do with MyChart, go to [email or web address].    Your  next appointment:   Based on test results  The format for your next appointment:   In Person  Provider:   Buford Dresser, MD  You are scheduled for a Myocardial Perfusion Imaging Study on  Please arrive 15 minutes prior to your appointment time for registration and insurance purposes.  The test will take approximately 3 to 4 hours to complete; you may bring reading material.  If someone comes with you to your appointment, they will need to remain in the main lobby due to limited space in the testing area. **If you are pregnant or breastfeeding, please notify the nuclear lab prior to your appointment**  How to prepare for your Myocardial Perfusion Test:  Do not eat or drink 3 hours prior to your test, except you may have water.  Do not consume products containing caffeine (regular or decaffeinated) 12 hours prior to your test. (ex: coffee, chocolate, sodas, tea).  Do bring a list of your current medications with you.  If not listed below, you may take your medications as normal.  Do wear comfortable clothes (no dresses or overalls) and walking shoes, tennis shoes preferred (No heels or open toe shoes are allowed).  Do NOT wear cologne, perfume, aftershave, or lotions (deodorant is allowed).  If these instructions are not followed, your test will have to be rescheduled.  Please report to [place], [address] for your test.  If you have questions or concerns about your appointment, you can call the Nuclear Lab at [phone].  If you cannot keep your appointment, please provide 24 hours notification to the Nuclear Lab, to avoid a possible $50 charge to your account.    Signed, Valerie Dresser, MD PhD 11/13/2020  [place]

## 2020-11-13 NOTE — Patient Instructions (Signed)
Medication Instructions:  Your Physician recommend you continue on your current medication as directed.    *If you need a refill on your cardiac medications before your next appointment, please call your pharmacy*   Lab Work: None   Testing/Procedures: Your physician has requested that you have an echocardiogram. Echocardiography is a painless test that uses sound waves to create images of your heart. It provides your doctor with information about the size and shape of your heart and how well your heart's chambers and valves are working. This procedure takes approximately one hour. There are no restrictions for this procedure. [place] has requested that you have a lexiscan myoview. For further information please visit [email or web address]. Please follow instruction sheet, as given. [place]. [address]    Follow-Up: At [facility], you and your health needs are our priority.  As part of our continuing mission to provide you with exceptional heart care, we have created designated Provider Care Teams.  These Care Teams include your primary Cardiologist (physician) and Advanced Practice Providers (APPs -  Physician Assistants and Nurse Practitioners) who all work together to provide you with the care you need, when you need it.  We recommend signing up for the patient portal called "MyChart".  Sign up information is provided on this After Visit Summary.  MyChart is used to connect with patients for Virtual Visits (Telemedicine).  Patients are able to view lab/test results, encounter notes, upcoming appointments, etc.  Non-urgent messages can be sent to your provider as well.   To learn more about what you can do with MyChart, go to [email or web address].    Your next appointment:   Based on test results  The format for your next appointment:   In Person  Provider:   Buford Dresser, MD  You are scheduled for a Myocardial  Perfusion Imaging Study on  Please arrive 15 minutes prior to your appointment time for registration and insurance purposes.  The test will take approximately 3 to 4 hours to complete; you may bring reading material.  If someone comes with you to your appointment, they will need to remain in the main lobby due to limited space in the testing area. **If you are pregnant or breastfeeding, please notify the nuclear lab prior to your appointment**  How to prepare for your Myocardial Perfusion Test: . Do not eat or drink 3 hours prior to your test, except you may have water. . Do not consume products containing caffeine (regular or decaffeinated) 12 hours prior to your test. (ex: coffee, chocolate, sodas, tea). . Do bring a list of your current medications with you.  If not listed below, you may take your medications as normal. . Do wear comfortable clothes (no dresses or overalls) and walking shoes, tennis shoes preferred (No heels or open toe shoes are allowed). . Do NOT wear cologne, perfume, aftershave, or lotions (deodorant is allowed). . If these instructions are not followed, your test will have to be rescheduled.  Please report to [place], [address] for your test.  If you have questions or concerns about your appointment, you can call the Nuclear Lab at [phone].  If you cannot keep your appointment, please provide 24 hours notification to the Nuclear Lab, to avoid a possible $50 charge to your account.

## 2020-11-16 ENCOUNTER — Telehealth (HOSPITAL_COMMUNITY): Payer: Self-pay | Admitting: *Deleted

## 2020-11-16 NOTE — Telephone Encounter (Signed)
Close encounter 

## 2020-11-17 ENCOUNTER — Ambulatory Visit (HOSPITAL_COMMUNITY)
Admission: RE | Admit: 2020-11-17 | Discharge: 2020-11-17 | Disposition: A | Discharge status: Home / Self Care | Payer: Medicare PPO | Source: Ambulatory Visit | Attending: Internal Medicine | Admitting: Internal Medicine

## 2020-11-17 ENCOUNTER — Other Ambulatory Visit: Payer: Self-pay

## 2020-11-17 DIAGNOSIS — R0609 Other forms of dyspnea: Secondary | ICD-10-CM

## 2020-11-17 DIAGNOSIS — R0600 Dyspnea, unspecified: Secondary | ICD-10-CM | POA: Diagnosis not present

## 2020-11-17 LAB — MYOCARDIAL PERFUSION IMAGING
LV dias vol: 47 mL (ref 46–106)
LV sys vol: 8 mL
Peak HR: 107 {beats}/min
Rest HR: 79 {beats}/min
SDS: 0
SRS: 0
SSS: 0
TID: 0.84

## 2020-11-17 MED ORDER — AMINOPHYLLINE 25 MG/ML IV SOLN
75.0000 mg | Freq: Once | INTRAVENOUS | Status: AC
  Administered 2020-11-17: 75 mg via INTRAVENOUS

## 2020-11-17 MED ORDER — REGADENOSON 0.4 MG/5ML IV SOLN
0.4000 mg | Freq: Once | INTRAVENOUS | Status: AC
  Administered 2020-11-17: 0.4 mg via INTRAVENOUS

## 2020-11-17 MED ORDER — TECHNETIUM TC 99M TETROFOSMIN IV KIT
31.6000 | PACK | Freq: Once | INTRAVENOUS | Status: AC | PRN
  Administered 2020-11-17: 31.6 via INTRAVENOUS
  Filled 2020-11-17: qty 32

## 2020-11-17 MED ORDER — TECHNETIUM TC 99M TETROFOSMIN IV KIT
10.9000 | PACK | Freq: Once | INTRAVENOUS | Status: AC | PRN
  Administered 2020-11-17: 10.9 via INTRAVENOUS
  Filled 2020-11-17: qty 11

## 2020-11-21 ENCOUNTER — Telehealth (HOSPITAL_COMMUNITY): Payer: Self-pay

## 2020-11-21 NOTE — Telephone Encounter (Signed)
Close enounter

## 2020-11-21 NOTE — Telephone Encounter (Signed)
Spoke to pt who report a day after lexiscan she has developed a rash on her chest area, under both arm pits, stomach, and down to groin. She describes rash as tiny red bumps that itches. She denies SOB or feeling like her throat is swollen.   Pt states she attempted to contact our office but left a message on nuclear VM. She state she was instructed by a pharmacist to take benadryl. Pt state she as been taking it TID since Sunday with no relief. Nurse encouraged pt to go to urgent care or contact pcp. Pt state she did not want to go to urgent care but will schedule an appointment. Nurse contact pcp office who state they would call pt to schedule appointment for further evaluations.

## 2020-11-22 NOTE — Telephone Encounter (Signed)
Agree, this is not typical for a drug reaction. She should be evaluated by PCP to see if there might be another cause.  Dr. Harrell Gave

## 2020-11-23 NOTE — Telephone Encounter (Signed)
Pt updated and verbalized understanding.  

## 2020-12-11 ENCOUNTER — Other Ambulatory Visit: Payer: Self-pay

## 2020-12-11 ENCOUNTER — Ambulatory Visit (HOSPITAL_COMMUNITY): Payer: Medicare PPO | Attending: Cardiology

## 2020-12-11 DIAGNOSIS — R0609 Other forms of dyspnea: Secondary | ICD-10-CM

## 2020-12-11 DIAGNOSIS — R0600 Dyspnea, unspecified: Secondary | ICD-10-CM

## 2020-12-11 LAB — ECHOCARDIOGRAM COMPLETE
Area-P 1/2: 2.22 cm2
S' Lateral: 2.2 cm

## 2021-02-28 DIAGNOSIS — Z85820 Personal history of malignant melanoma of skin: Secondary | ICD-10-CM | POA: Diagnosis not present

## 2021-02-28 DIAGNOSIS — L720 Epidermal cyst: Secondary | ICD-10-CM | POA: Diagnosis not present

## 2021-02-28 DIAGNOSIS — Z85828 Personal history of other malignant neoplasm of skin: Secondary | ICD-10-CM | POA: Diagnosis not present

## 2021-02-28 DIAGNOSIS — D692 Other nonthrombocytopenic purpura: Secondary | ICD-10-CM | POA: Diagnosis not present

## 2021-02-28 DIAGNOSIS — D1801 Hemangioma of skin and subcutaneous tissue: Secondary | ICD-10-CM | POA: Diagnosis not present

## 2021-03-16 DIAGNOSIS — Z20822 Contact with and (suspected) exposure to covid-19: Secondary | ICD-10-CM | POA: Diagnosis not present

## 2021-03-18 DIAGNOSIS — Z20822 Contact with and (suspected) exposure to covid-19: Secondary | ICD-10-CM | POA: Diagnosis not present

## 2021-03-21 DIAGNOSIS — U071 COVID-19: Secondary | ICD-10-CM | POA: Diagnosis not present

## 2021-03-21 DIAGNOSIS — R051 Acute cough: Secondary | ICD-10-CM | POA: Diagnosis not present

## 2021-04-09 ENCOUNTER — Telehealth: Payer: Self-pay | Admitting: Gastroenterology

## 2021-04-09 NOTE — Telephone Encounter (Signed)
The pt states that she recently had COVID and was treated with antiviral.  She says that she has since developed diarrhea and would like something called in.  I did advise that she has not been seen since 9/21 and would need an appt.  She has been scheduled for 8/25 with Ellouise Newer.  I did advise her to call the precriber of the antiviral and make them aware that the medication has caused diarrhea as well. She has agreed to call and will call back if she does not need the appt.

## 2021-04-09 NOTE — Telephone Encounter (Signed)
Inbound call from patient requesting a call from a nurse please.  States has been experiencing diarrhea for the past 2 weeks.

## 2021-04-12 ENCOUNTER — Ambulatory Visit: Payer: Medicare PPO | Admitting: Physician Assistant

## 2021-04-12 ENCOUNTER — Encounter: Payer: Self-pay | Admitting: Physician Assistant

## 2021-04-12 VITALS — BP 142/80 | HR 87 | Ht 63.0 in | Wt 130.2 lb

## 2021-04-12 DIAGNOSIS — K580 Irritable bowel syndrome with diarrhea: Secondary | ICD-10-CM | POA: Diagnosis not present

## 2021-04-12 MED ORDER — DIPHENOXYLATE-ATROPINE 2.5-0.025 MG PO TABS: 1.0000 | ORAL_TABLET | ORAL | 2 refills | Status: DC | PRN

## 2021-04-12 MED ORDER — HYOSCYAMINE SULFATE 0.125 MG SL SUBL: 0.1250 mg | SUBLINGUAL_TABLET | Freq: Four times a day (QID) | SUBLINGUAL | 11 refills | Status: DC | PRN

## 2021-04-12 NOTE — Progress Notes (Signed)
Chief Complaint: Diarrhea  HPI:    Valerie Decker is an 87 year old female with a past medical history as listed below including longstanding IBS with loose stools and suspected pelvic floor dysfunction, known to Dr. Hilarie Fredrickson, who presents to clinic today with a complaint of diarrhea.    05/10/2020 patient seen in clinic for follow-up.  At that time she continued to use Lomotil and antispasmodics as needed.  She had previously used Bentyl but liked Levsin better.  Also taking 2 tablespoons of Citrucel daily at that time.  Also uses frequent Fleet suppositories which she had been doing for 50+ years.  At that time explained she did this first thing in the morning she does not have a bowel movement to make sure that she could clear her bowels before going out about the day.  At that time she was doing well with Lomotil and Levsin as needed.  She was given a refill for Levsin and continued on fiber supplement.  We discussed adding back a probiotic into her regimen and she been on align for years.  Samples of IBgard were also given to use as needed.    Today, the patient presents to clinic accompanied by her daughter who does assist with history.  She explains that she was diagnosed with COVID 2 to 3 weeks ago and ever since then has had a flare of what she calls diarrhea.  She describes having a bowel movement only 1-2 times a day which is soft but not liquid, but very urgent and "messy", she describes having to wash herself multiple times a day.  She has continued her Metamucil once daily but is no longer taking any of her medications for IBS.  She is not sure when she stopped taking these regularly.  Daughter also tells me that previously she was following the low FODMAP diet mostly and recently has fallen off track with this.    Denies fever, chills, blood in her stool, heartburn, reflux or symptoms that awaken her from sleep.  Past Medical History:  Diagnosis Date  . Allergic rhinitis, cause unspecified    . Asthma   . Carpal tunnel syndrome   . Diverticulosis   . Elevated serum homocysteine level   . Esophageal reflux   . H. pylori infection 2010  . Irritable bowel syndrome   . Irritable bowel syndrome with diarrhea   . Mixed hyperlipidemia   . Neuralgia, neuritis, and radiculitis, unspecified   . Osteopenia   . Unspecified asthma(493.90)   . Unspecified essential hypertension   . Unspecified hypothyroidism   . Unspecified sleep apnea    marginal; intolerant to CPAP    Past Surgical History:  Procedure Laterality Date  . BREAST BIOPSY    . COLONOSCOPY     Tics  . DILATION AND CURETTAGE OF UTERUS    . facial plastic surgery    . TONSILLECTOMY AND ADENOIDECTOMY     ? uvulectomy  . UVULECTOMY      Current Outpatient Medications  Medication Sig Dispense Refill  . acetaminophen (TYLENOL) 500 MG tablet Take 500-1,000 mg by mouth every 6 (six) hours as needed.    Marland Kitchen albuterol (ACCUNEB) 0.63 MG/3ML nebulizer solution Take 1 ampule by nebulization as needed for wheezing.    Marland Kitchen albuterol (VENTOLIN HFA) 108 (90 Base) MCG/ACT inhaler Inhale 1 puff into the lungs 2 (two) times daily.    . budesonide-formoterol (SYMBICORT) 160-4.5 MCG/ACT inhaler Inhale 1-2 puffs into the lungs 2 (two) times daily.    Marland Kitchen  CALCIUM CITRATE-VITAMIN D PO Take 2 tablets by mouth daily. Calcium citrate 400 iu calcium/'630mg'$  vitamin d    . Cholecalciferol (VITAMIN D-3) 125 MCG (5000 UT) TABS Take 1 tablet by mouth daily.    . citalopram (CELEXA) 20 MG tablet Take 20 mg by mouth daily.    . Coenzyme Q10 (CO Q 10) 60 MG CAPS Take 1 capsule by mouth daily.    . Dicyclomine HCl (BENTYL PO) Take by mouth as needed.    . diltiazem (CARDIZEM CD) 120 MG 24 hr capsule 1 capsule daily.    . Diphenoxylate-Atropine (LOMOTIL PO) Take by mouth as needed.    . fluticasone (FLONASE) 50 MCG/ACT nasal spray Place 2 sprays into the nose daily.    . hyoscyamine (LEVSIN SL) 0.125 MG SL tablet Take 1 tablet (0.125 mg total) by mouth  every 6 (six) hours as needed. 30 tablet 11  . levocetirizine (XYZAL) 5 MG tablet Take 5 mg by mouth every evening.    Marland Kitchen levothyroxine (SYNTHROID, LEVOTHROID) 25 MCG tablet TAKE 1 TABLET ONCE DAILY. 90 tablet 1  . lisinopril (ZESTRIL) 40 MG tablet 1 tablet daily.    . Methylcellulose, Laxative, (CITRUCEL PO) Take by mouth daily. 2 teaspoons daily    . montelukast (SINGULAIR) 10 MG tablet Take 10 mg by mouth at bedtime.    Marland Kitchen omeprazole (PRILOSEC) 40 MG capsule Take 40 mg by mouth daily.    . simvastatin (ZOCOR) 10 MG tablet TAKE ONE TABLET AT BEDTIME. 90 tablet 1  . Turmeric (QC TUMERIC COMPLEX PO) Take 750 mg by mouth daily.     No current facility-administered medications for this visit.    Allergies as of 04/12/2021 - Review Complete 11/17/2020  Allergen Reaction Noted  . Penicillins Diarrhea and Nausea And Vomiting 01/17/2016  . Tape Rash 10/17/2016    Family History  Problem Relation Age of Onset  . Heart failure Mother        liver disease  . Liver disease Mother 74  . Stroke Maternal Aunt   . COPD Maternal Aunt   . Lung cancer Maternal Aunt   . Asthma Maternal Grandfather   . Colon cancer Neg Hx     Social History   Socioeconomic History  . Marital status: Married    Spouse name: Not on file  . Number of children: 2  . Years of education: Not on file  . Highest education level: Not on file  Occupational History  . Not on file  Tobacco Use  . Smoking status: Never  . Smokeless tobacco: Never  . Tobacco comments:    Second hand smoke from mother & husband for decades  Vaping Use  . Vaping Use: Never used  Substance and Sexual Activity  . Alcohol use: No    Alcohol/week: 0.0 standard drinks    Comment: Occasionally has a glass of red wine with dinner  . Drug use: No  . Sexual activity: Not on file  Other Topics Concern  . Not on file  Social History Narrative   Patient is married and lives at home with her husband. She has two children. She is retired. She  reports she completed a 4 year degree.    Social Determinants of Health   Financial Resource Strain: Not on file  Food Insecurity: Not on file  Transportation Needs: Not on file  Physical Activity: Not on file  Stress: Not on file  Social Connections: Not on file  Intimate Partner Violence: Not on file  Review of Systems:    Constitutional: No weight loss, fever or chills Cardiovascular: No chest pain Respiratory: No SOB  Gastrointestinal: See HPI and otherwise negative   Physical Exam:  Vital signs: BP (!) 142/80   Pulse 87   Ht '5\' 3"'$  (1.6 m)   Wt 130 lb 3.2 oz (59.1 kg)   SpO2 99%   BMI 23.06 kg/m    Constitutional:   Very Pleasant Elderly Caucasian female appears to be in NAD, Well developed, Well nourished, alert and cooperative Respiratory: Respirations even and unlabored. Lungs clear to auscultation bilaterally.   No wheezes, crackles, or rhonchi.  Cardiovascular: Normal S1, S2. No MRG. Regular rate and rhythm. No peripheral edema, cyanosis or pallor.  Gastrointestinal:  Soft, nondistended, nontender. No rebound or guarding. Normal bowel sounds. No appreciable masses or hepatomegaly. Rectal:  Not performed.  Psychiatric: Demonstrates good judgement and reason without abnormal affect or behaviors.  No recent labs or imaging.  Assessment: 1.  IBS-D: Was under good control in September of last year when she saw Jess, recent COVID infection which seems to be exacerbated symptoms over the past 2 to 3 weeks, currently not taking any medications; most likely still IBS possibly exacerbated by COVID  Plan: 1.  Discussed with patient and her daughter that I feel like it is a good plan for them to go back through the low FODMAP diet and see if there is anything the patient is eating in excess that she could maybe eat in moderation.  This includes sodas which we discussed today.  Her daughter plans to do this with her later today. 2.  Refilled patient's Hyoscyamine 0.125 mg  sublingual tabs.  At this point would recommend she take this scheduled twice a day given urgency, 20-30 minutes before breakfast and again before dinner.  Prescribed #60 with 11 refills.  Discussed that we can titrate this up or down depending on how it is working for her. 3.  Refilled patient's Lomotil at patient's request.  Did discuss that this should only be used 1 tab every 4-6 hours for diarrhea.  I do not think she needs this medication right now.  Prescribed #30 with 2 refills. 4.  Continue Metamucil daily 5.  Patient will follow in clinic with me in 2 months.  She will contact us before then if things are not going well.  Ellouise Newer, PA-C [facility] 04/12/2021, 10:28 AM  Cc: Maurice Small, MD

## 2021-04-12 NOTE — Patient Instructions (Signed)
We have sent the following medications to your pharmacy for you to pick up at your convenience: Hyoscyamine 0.125 mg SL tablets twice daily before breakfast and dinner.  Lomotil as needed for diarrhea.   Continue Metamucil daily   Start the Low FODMAP diet again.   If you are age 85 or older, your body mass index should be between 23-30. Your Body mass index is 23.06 kg/m. If this is out of the aforementioned range listed, please consider follow up with your Primary Care Provider.  If you are age 85 or younger, your body mass index should be between 19-25. Your Body mass index is 23.06 kg/m. If this is out of the aformentioned range listed, please consider follow up with your Primary Care Provider.   __________________________________________________________  The [facility] providers would like to encourage you to use [facility] to communicate with providers for non-urgent requests or questions.  Due to long hold times on the telephone, sending your provider a message by [facility] may be a faster and more efficient way to get a response.  Please allow 48 business hours for a response.  Please remember that this is for non-urgent requests.

## 2021-04-12 NOTE — Progress Notes (Signed)
Addendum: Reviewed and agree with assessment and management plan. Andreana Klingerman M, MD  

## 2021-05-30 DIAGNOSIS — D485 Neoplasm of uncertain behavior of skin: Secondary | ICD-10-CM | POA: Diagnosis not present

## 2021-05-30 DIAGNOSIS — C44629 Squamous cell carcinoma of skin of left upper limb, including shoulder: Secondary | ICD-10-CM | POA: Diagnosis not present

## 2021-05-30 DIAGNOSIS — Z85820 Personal history of malignant melanoma of skin: Secondary | ICD-10-CM | POA: Diagnosis not present

## 2021-05-30 DIAGNOSIS — Z85828 Personal history of other malignant neoplasm of skin: Secondary | ICD-10-CM | POA: Diagnosis not present

## 2021-07-18 DIAGNOSIS — E2839 Other primary ovarian failure: Secondary | ICD-10-CM | POA: Diagnosis not present

## 2021-07-18 DIAGNOSIS — F330 Major depressive disorder, recurrent, mild: Secondary | ICD-10-CM | POA: Diagnosis not present

## 2021-07-25 ENCOUNTER — Other Ambulatory Visit: Payer: Self-pay | Admitting: Family Medicine

## 2021-07-25 DIAGNOSIS — N644 Mastodynia: Secondary | ICD-10-CM

## 2021-08-01 DIAGNOSIS — F330 Major depressive disorder, recurrent, mild: Secondary | ICD-10-CM | POA: Diagnosis not present

## 2021-09-10 ENCOUNTER — Inpatient Hospital Stay (HOSPITAL_COMMUNITY): Payer: Medicare PPO

## 2021-09-10 ENCOUNTER — Inpatient Hospital Stay (HOSPITAL_COMMUNITY)
Admission: EM | Admit: 2021-09-10 | Discharge: 2021-09-14 | DRG: 522 | Disposition: A | Discharge status: Skilled Nursing Facility | Payer: Medicare PPO | Attending: Internal Medicine | Admitting: Internal Medicine

## 2021-09-10 ENCOUNTER — Emergency Department (HOSPITAL_COMMUNITY): Payer: Medicare PPO

## 2021-09-10 ENCOUNTER — Encounter (HOSPITAL_COMMUNITY): Payer: Self-pay

## 2021-09-10 ENCOUNTER — Other Ambulatory Visit: Payer: Self-pay

## 2021-09-10 DIAGNOSIS — S3993XA Unspecified injury of pelvis, initial encounter: Secondary | ICD-10-CM | POA: Diagnosis not present

## 2021-09-10 DIAGNOSIS — Z880 Allergy status to penicillin: Secondary | ICD-10-CM

## 2021-09-10 DIAGNOSIS — S72011A Unspecified intracapsular fracture of right femur, initial encounter for closed fracture: Secondary | ICD-10-CM | POA: Diagnosis not present

## 2021-09-10 DIAGNOSIS — Z7401 Bed confinement status: Secondary | ICD-10-CM | POA: Diagnosis not present

## 2021-09-10 DIAGNOSIS — Z471 Aftercare following joint replacement surgery: Secondary | ICD-10-CM | POA: Diagnosis not present

## 2021-09-10 DIAGNOSIS — M47816 Spondylosis without myelopathy or radiculopathy, lumbar region: Secondary | ICD-10-CM | POA: Diagnosis not present

## 2021-09-10 DIAGNOSIS — E039 Hypothyroidism, unspecified: Secondary | ICD-10-CM | POA: Diagnosis not present

## 2021-09-10 DIAGNOSIS — G4730 Sleep apnea, unspecified: Secondary | ICD-10-CM | POA: Diagnosis present

## 2021-09-10 DIAGNOSIS — Z79890 Hormone replacement therapy (postmenopausal): Secondary | ICD-10-CM

## 2021-09-10 DIAGNOSIS — Z825 Family history of asthma and other chronic lower respiratory diseases: Secondary | ICD-10-CM | POA: Diagnosis not present

## 2021-09-10 DIAGNOSIS — E785 Hyperlipidemia, unspecified: Secondary | ICD-10-CM | POA: Diagnosis not present

## 2021-09-10 DIAGNOSIS — J45909 Unspecified asthma, uncomplicated: Secondary | ICD-10-CM | POA: Diagnosis not present

## 2021-09-10 DIAGNOSIS — Z8249 Family history of ischemic heart disease and other diseases of the circulatory system: Secondary | ICD-10-CM

## 2021-09-10 DIAGNOSIS — Z91048 Other nonmedicinal substance allergy status: Secondary | ICD-10-CM

## 2021-09-10 DIAGNOSIS — K219 Gastro-esophageal reflux disease without esophagitis: Secondary | ICD-10-CM | POA: Diagnosis present

## 2021-09-10 DIAGNOSIS — Z6821 Body mass index (BMI) 21.0-21.9, adult: Secondary | ICD-10-CM

## 2021-09-10 DIAGNOSIS — Z01811 Encounter for preprocedural respiratory examination: Secondary | ICD-10-CM | POA: Diagnosis not present

## 2021-09-10 DIAGNOSIS — F32A Depression, unspecified: Secondary | ICD-10-CM | POA: Diagnosis not present

## 2021-09-10 DIAGNOSIS — E782 Mixed hyperlipidemia: Secondary | ICD-10-CM | POA: Diagnosis present

## 2021-09-10 DIAGNOSIS — Z96641 Presence of right artificial hip joint: Secondary | ICD-10-CM | POA: Diagnosis not present

## 2021-09-10 DIAGNOSIS — I10 Essential (primary) hypertension: Secondary | ICD-10-CM | POA: Diagnosis present

## 2021-09-10 DIAGNOSIS — E440 Moderate protein-calorie malnutrition: Secondary | ICD-10-CM | POA: Diagnosis not present

## 2021-09-10 DIAGNOSIS — Z20822 Contact with and (suspected) exposure to covid-19: Secondary | ICD-10-CM | POA: Diagnosis not present

## 2021-09-10 DIAGNOSIS — R9431 Abnormal electrocardiogram [ECG] [EKG]: Secondary | ICD-10-CM | POA: Diagnosis not present

## 2021-09-10 DIAGNOSIS — Z01818 Encounter for other preprocedural examination: Secondary | ICD-10-CM | POA: Diagnosis not present

## 2021-09-10 DIAGNOSIS — F419 Anxiety disorder, unspecified: Secondary | ICD-10-CM | POA: Diagnosis present

## 2021-09-10 DIAGNOSIS — S72001A Fracture of unspecified part of neck of right femur, initial encounter for closed fracture: Principal | ICD-10-CM | POA: Diagnosis present

## 2021-09-10 DIAGNOSIS — M8580 Other specified disorders of bone density and structure, unspecified site: Secondary | ICD-10-CM | POA: Diagnosis present

## 2021-09-10 DIAGNOSIS — Z96649 Presence of unspecified artificial hip joint: Secondary | ICD-10-CM

## 2021-09-10 DIAGNOSIS — S72001D Fracture of unspecified part of neck of right femur, subsequent encounter for closed fracture with routine healing: Secondary | ICD-10-CM | POA: Diagnosis not present

## 2021-09-10 DIAGNOSIS — M2550 Pain in unspecified joint: Secondary | ICD-10-CM | POA: Diagnosis not present

## 2021-09-10 DIAGNOSIS — Z96642 Presence of left artificial hip joint: Secondary | ICD-10-CM | POA: Diagnosis not present

## 2021-09-10 DIAGNOSIS — Z4789 Encounter for other orthopedic aftercare: Secondary | ICD-10-CM | POA: Diagnosis not present

## 2021-09-10 DIAGNOSIS — W19XXXA Unspecified fall, initial encounter: Secondary | ICD-10-CM | POA: Diagnosis present

## 2021-09-10 DIAGNOSIS — Z79899 Other long term (current) drug therapy: Secondary | ICD-10-CM | POA: Diagnosis not present

## 2021-09-10 DIAGNOSIS — M25551 Pain in right hip: Secondary | ICD-10-CM | POA: Diagnosis not present

## 2021-09-10 LAB — CBC WITH DIFFERENTIAL/PLATELET
Abs Immature Granulocytes: 0.02 10*3/uL (ref 0.00–0.07)
Basophils Absolute: 0.1 10*3/uL (ref 0.0–0.1)
Basophils Relative: 1 %
Eosinophils Absolute: 0.1 10*3/uL (ref 0.0–0.5)
Eosinophils Relative: 1 %
HCT: 41.3 % (ref 36.0–46.0)
Hemoglobin: 14.1 g/dL (ref 12.0–15.0)
Immature Granulocytes: 0 %
Lymphocytes Relative: 20 %
Lymphs Abs: 1.7 10*3/uL (ref 0.7–4.0)
MCH: 32.7 pg (ref 26.0–34.0)
MCHC: 34.1 g/dL (ref 30.0–36.0)
MCV: 95.8 fL (ref 80.0–100.0)
Monocytes Absolute: 0.8 10*3/uL (ref 0.1–1.0)
Monocytes Relative: 8 %
Neutro Abs: 6.3 10*3/uL (ref 1.7–7.7)
Neutrophils Relative %: 70 %
Platelets: 282 10*3/uL (ref 150–400)
RBC: 4.31 MIL/uL (ref 3.87–5.11)
RDW: 13.7 % (ref 11.5–15.5)
WBC: 8.9 10*3/uL (ref 4.0–10.5)
nRBC: 0 % (ref 0.0–0.2)

## 2021-09-10 LAB — BASIC METABOLIC PANEL
Anion gap: 12 (ref 5–15)
BUN: 16 mg/dL (ref 8–23)
CO2: 21 mmol/L — ABNORMAL LOW (ref 22–32)
Calcium: 8.6 mg/dL — ABNORMAL LOW (ref 8.9–10.3)
Chloride: 100 mmol/L (ref 98–111)
Creatinine, Ser: 0.77 mg/dL (ref 0.44–1.00)
GFR, Estimated: >60 mL/min (ref >60)
Glucose, Bld: 86 mg/dL (ref 70–99)
Potassium: 4.2 mmol/L (ref 3.5–5.1)
Sodium: 133 mmol/L — ABNORMAL LOW (ref 135–145)

## 2021-09-10 LAB — I-STAT CHEM 8, ED
BUN: 23 mg/dL (ref 8–23)
Calcium, Ion: 1.15 mmol/L (ref 1.15–1.40)
Chloride: 99 mmol/L (ref 98–111)
Creatinine, Ser: 0.7 mg/dL (ref 0.44–1.00)
Glucose, Bld: 120 mg/dL — ABNORMAL HIGH (ref 70–99)
HCT: 46 % (ref 36.0–46.0)
Hemoglobin: 15.6 g/dL — ABNORMAL HIGH (ref 12.0–15.0)
Potassium: 4.3 mmol/L (ref 3.5–5.1)
Sodium: 135 mmol/L (ref 135–145)
TCO2: 29 mmol/L (ref 22–32)

## 2021-09-10 LAB — TYPE AND SCREEN
ABO/RH(D): O POS
Antibody Screen: NEGATIVE

## 2021-09-10 LAB — RESP PANEL BY RT-PCR (FLU A&B, COVID) ARPGX2
Influenza A by PCR: NEGATIVE
Influenza B by PCR: NEGATIVE
SARS Coronavirus 2 by RT PCR: NEGATIVE

## 2021-09-10 LAB — ABO/RH: ABO/RH(D): O POS

## 2021-09-10 MED ORDER — ONDANSETRON HCL 4 MG/2ML IJ SOLN
4.0000 mg | Freq: Four times a day (QID) | INTRAMUSCULAR | Status: DC | PRN
  Administered 2021-09-10 – 2021-09-11 (×2): 4 mg via INTRAVENOUS
  Filled 2021-09-10 (×2): qty 2

## 2021-09-10 MED ORDER — MORPHINE SULFATE (PF) 2 MG/ML IV SOLN
2.0000 mg | Freq: Once | INTRAVENOUS | Status: AC
  Administered 2021-09-10: 2 mg via INTRAMUSCULAR
  Filled 2021-09-10: qty 1

## 2021-09-10 MED ORDER — MORPHINE SULFATE (PF) 2 MG/ML IV SOLN
0.5000 mg | INTRAVENOUS | Status: DC | PRN
  Administered 2021-09-11 (×2): 0.5 mg via INTRAVENOUS
  Filled 2021-09-10 (×2): qty 1

## 2021-09-10 MED ORDER — HYDROCODONE-ACETAMINOPHEN 5-325 MG PO TABS
1.0000 | ORAL_TABLET | Freq: Four times a day (QID) | ORAL | Status: DC | PRN
  Administered 2021-09-11: 02:00:00 1 via ORAL
  Filled 2021-09-10: qty 1

## 2021-09-10 MED ORDER — HYDROMORPHONE HCL 1 MG/ML IJ SOLN
0.5000 mg | Freq: Once | INTRAMUSCULAR | Status: AC
  Administered 2021-09-10: 0.5 mg via INTRAMUSCULAR
  Filled 2021-09-10: qty 1

## 2021-09-10 NOTE — ED Notes (Signed)
Pt transported to xray 

## 2021-09-10 NOTE — ED Provider Notes (Addendum)
[facility] EMERGENCY DEPARTMENT Provider Note   CSN: [id number] Arrival date & time: 09/10/21  1114     History  Chief Complaint  Patient presents with   Leg Pain    Valerie Decker is a 88 y.o. female.  HPI Patient presents with her husband assists with the history.  She presents due to worseningSevere pain focally in the right lateral hip.  Had a fall 1 week ago, has been nonambulatory since that point.  It is unclear why she waited 1 week for evaluation, but states that in spite of OTC medication and rest she has not improved.  She denies any head pain, neck pain, chest pain, dyspnea, weakness, numbness, tingling in the distal extremity.    Home Medications Prior to Admission medications   Medication Sig Start Date End Date Taking? Authorizing Provider  acetaminophen (TYLENOL) 500 MG tablet Take 500-1,000 mg by mouth every 6 (six) hours as needed.    [provider]  albuterol (ACCUNEB) 0.63 MG/3ML nebulizer solution Take 1 ampule by nebulization as needed for wheezing.    [provider]  albuterol (VENTOLIN HFA) 108 (90 Base) MCG/ACT inhaler Inhale 1 puff into the lungs 2 (two) times daily.    [provider]  budesonide-formoterol (SYMBICORT) 160-4.5 MCG/ACT inhaler Inhale 1-2 puffs into the lungs 2 (two) times daily.    [provider]  CALCIUM CITRATE-VITAMIN D PO Take 2 tablets by mouth daily. Calcium citrate 400 iu calcium/630mg  vitamin d    [provider]  Cholecalciferol (VITAMIN D-3) 125 MCG (5000 UT) TABS Take 1 tablet by mouth daily.    [provider]  citalopram (CELEXA) 20 MG tablet Take 20 mg by mouth daily. 05/19/14   [provider]  Coenzyme Q10 (CO Q 10) 60 MG CAPS Take 1 capsule by mouth daily.    [provider]  Dicyclomine HCl (BENTYL PO) Take by mouth as needed.    [provider]  diltiazem (CARDIZEM CD) 120 MG 24 hr capsule 1 capsule daily. 03/23/20    [provider]  diphenoxylate-atropine (LOMOTIL) 2.5-0.025 MG tablet Take 1 tablet by mouth as needed. 04/12/21   Levin Erp, PA  fluticasone (FLONASE) 50 MCG/ACT nasal spray Place 2 sprays into the nose daily.    [provider]  hyoscyamine (LEVSIN SL) 0.125 MG SL tablet Take 1 tablet (0.125 mg total) by mouth every 6 (six) hours as needed. 04/12/21   Levin Erp, PA  levocetirizine (XYZAL) 5 MG tablet Take 5 mg by mouth every evening.    [provider]  levothyroxine (SYNTHROID, LEVOTHROID) 25 MCG tablet TAKE 1 TABLET ONCE DAILY. 09/17/13   Hendricks Limes, MD  lisinopril (ZESTRIL) 40 MG tablet 1 tablet daily. 04/25/20   [provider]  Methylcellulose, Laxative, (CITRUCEL PO) Take by mouth daily. 2 teaspoons daily    [provider]  montelukast (SINGULAIR) 10 MG tablet Take 10 mg by mouth at bedtime.    [provider]  omeprazole (PRILOSEC) 40 MG capsule Take 40 mg by mouth daily.    [provider]  simvastatin (ZOCOR) 10 MG tablet TAKE ONE TABLET AT BEDTIME. 09/21/13   Hendricks Limes, MD  Turmeric (QC TUMERIC COMPLEX PO) Take 750 mg by mouth daily.    [provider]      Allergies    Penicillins and Tape    Review of Systems   Review of Systems  Constitutional:  Per HPI, otherwise negative  HENT:         Per HPI, otherwise negative  Respiratory:         Per HPI, otherwise negative  Cardiovascular:        Per HPI, otherwise negative  Gastrointestinal:  Negative for vomiting.  Endocrine:       Negative aside from HPI  Genitourinary:        Neg aside from HPI   Musculoskeletal:        Per HPI, otherwise negative  Skin: Negative.   Neurological:  Negative for syncope.   Physical Exam Updated Vital Signs BP (!) 179/84 (BP Location: Right Arm)    Pulse 85    Temp 98.6 F (37 C) (Oral)    Resp 18    Ht 5\' 5"  (1.651 m)    Wt 58.5 kg    SpO2 96%    BMI 21.47 kg/m  Physical  Exam Vitals and nursing note reviewed.  Constitutional:      General: She is not in acute distress.    Appearance: She is well-developed.  HENT:     Head: Normocephalic and atraumatic.  Eyes:     Conjunctiva/sclera: Conjunctivae normal.  Cardiovascular:     Rate and Rhythm: Normal rate and regular rhythm.  Pulmonary:     Effort: Pulmonary effort is normal. No respiratory distress.     Breath sounds: Normal breath sounds. No stridor.  Abdominal:     General: There is no distension.  Musculoskeletal:       Legs:     Comments: Right knee, right ankle unremarkable.  Skin:    General: Skin is warm and dry.  Neurological:     Mental Status: She is alert and oriented to person, place, and time.     Cranial Nerves: No cranial nerve deficit.    ED Results / Procedures / Treatments   Labs (all labs ordered are listed, but only abnormal results are displayed) Labs Reviewed  I-STAT CHEM 8, ED - Abnormal; Notable for the following components:      Result Value   Glucose, Bld 120 (*)    Hemoglobin 15.6 (*)    All other components within normal limits  RESP PANEL BY RT-PCR (FLU A&B, COVID) ARPGX2  BASIC METABOLIC PANEL  CBC WITH DIFFERENTIAL/PLATELET    EKG EKG Interpretation  Date/Time:  Monday September 10 2021 22:53:22 EST Ventricular Rate:  82 PR Interval:  163 QRS Duration: 90 QT Interval:  389 QTC Calculation: 455 R Axis:   63 Text Interpretation: Sinus rhythm unremarkable ECG Confirmed by Carmin Muskrat [phone]) on 09/10/2021 11:11:58 PM  Radiology CT PELVIS WO CONTRAST  Result Date: 09/10/2021 CLINICAL DATA:  Pelvic trauma. EXAM: CT PELVIS WITHOUT CONTRAST TECHNIQUE: Multidetector CT imaging of the pelvis was performed following the standard protocol without intravenous contrast. RADIATION DOSE REDUCTION: This exam was performed according to the departmental dose-optimization program which includes automated exposure control, adjustment of the mA and/or kV according to  patient size and/or use of iterative reconstruction technique. COMPARISON:  Right hip radiograph dated 09/10/2021. FINDINGS: Evaluation of this exam is limited in the absence of intravenous contrast. Urinary Tract:  No abnormality visualized. Bowel:  Unremarkable visualized pelvic bowel loops. Vascular/Lymphatic: Moderate atherosclerotic calcification. The visualized vasculature is otherwise unremarkable. No adenopathy within the pelvis. Reproductive:  The uterus and ovaries are grossly unremarkable. Other:  None Musculoskeletal: Nondisplaced, impacted right femoral neck fracture. No dislocation. The bones are osteopenic. Degenerative changes of the lower lumbar  spine. IMPRESSION: Nondisplaced, impacted right femoral neck fracture. Electronically Signed   By: Anner Crete M.D.   On: 09/10/2021 21:55   DG Hip Unilat W or Wo Pelvis 2-3 Views Right  Result Date: 09/10/2021 CLINICAL DATA:  Right hip pain since a fall 7 days ago. EXAM: DG HIP (WITH OR WITHOUT PELVIS) 2-3V RIGHT COMPARISON:  None. FINDINGS: Moderate to marked femoral head and neck junction spur formation with mild joint space narrowing. No fracture or dislocation seen. Lower lumbar spine degenerative changes. Atheromatous arterial calcifications. Mildly prominent stool in the colon. IMPRESSION: 1. No fracture or dislocation. 2. Moderate right hip degenerative changes. 3. Lower lumbar spine degenerative changes. Electronically Signed   By: Claudie Revering M.D.   On: 09/10/2021 12:30    Procedures Procedures    Medications Ordered in ED Medications  morphine 2 MG/ML injection 2 mg (2 mg Intramuscular Given 09/10/21 1924)  HYDROmorphone (DILAUDID) injection 0.5 mg (0.5 mg Intramuscular Given 09/10/21 2048)    ED Course/ Medical Decision Making/ A&P With consideration of contusion versus hematoma versus fracture patient had x-ray, labs   Update: X-ray interpreted by me, negative for obvious fracture, but with ongoing pain concern for occult  fracture CT ordered.  Update I have interpreted the CT, discussed with the patient and her husband, with concern for femoral neck fracture patient had consult with Dr. Arlington Calix, internal medicine admission.                          Medical Decision Making Adult female presents 1 week after fall now with ongoing pain in her right hip.  Patient is distally neurovascular intact, is awake, alert, has no other injuries, with concern for fracture versus hematoma versus soft tissue injury patient had x-ray, then CT, findings concerning for femoral neck fracture, requiring admission after orthopedic consult conversation, internal medicine admission.  Amount and/or Complexity of Data Reviewed Independent Historian: spouse External Data Reviewed: notes.    Details: COVID end of last year with management from primary care Labs: ordered. Decision-making details documented in ED Course.    Details: Unremarkable Radiology: ordered. Decision-making details documented in ED Course. ECG/medicine tests: independent interpretation performed.  Risk Prescription drug management. Decision regarding hospitalization.  Critical Care Total time providing critical care: < 30 minutes       Final Clinical Impression(s) / ED Diagnoses Final diagnoses:  Fall, initial encounter  Closed fracture of neck of right femur, initial encounter [facility])     Carmin Muskrat, MD 09/10/21 2223    Carmin Muskrat, MD 09/10/21 2312

## 2021-09-10 NOTE — ED Notes (Signed)
Received verbal report from Megan R RN at t his time °

## 2021-09-10 NOTE — ED Notes (Signed)
Pt spouse states pt is still hurting is there something else we can do. Advised would send provider a message reference same. Provider sent a message

## 2021-09-10 NOTE — ED Provider Triage Note (Signed)
Emergency Medicine Provider Triage Evaluation Note  Valerie Decker , a 88 y.o. female  was evaluated in triage.  Pt complains of fall on Tuesday. She reports that she fell 6 days ago.  She has had no deformity.  She has been taking tramadol without relief.  She states she took her blood pressure meds.  She denies any numbness.  She does not take blood thinning medications.  She denies injuries other than her right hip.  It was mechanical slip and fall while taking the trash out.  Didn't strike her head.  Physical Exam  BP (!) 167/121    Pulse 96    Temp 98.6 F (37 C) (Oral)    Resp 20    SpO2 95%  Gen:   Awake, no distress   Resp:  Normal effort  MSK:   Moves extremities without difficulty  Other:  2+ DP pulse right foot, sensation intact to light touch to right foot, able to move right foot/ankle.   Medical Decision Making  Medically screening exam initiated at 11:54 AM.  Appropriate orders placed.  Barbee Mamula was informed that the remainder of the evaluation will be completed by another provider, this initial triage assessment does not replace that evaluation, and the importance of remaining in the ED until their evaluation is complete.  Note: Portions of this report may have been transcribed using voice recognition software. Every effort was made to ensure accuracy; however, inadvertent computerized transcription errors may be present    Lorin Glass, PA-C 09/10/21 1156

## 2021-09-10 NOTE — ED Notes (Signed)
Provider at bedside

## 2021-09-10 NOTE — ED Notes (Signed)
Pt denies any pain at this time but states she still has nausea. Admit provider made aware of same

## 2021-09-10 NOTE — ED Notes (Signed)
Pt adjusted in bed at this time

## 2021-09-10 NOTE — ED Notes (Signed)
Pt transported to ct at this time 

## 2021-09-10 NOTE — ED Notes (Signed)
Provider at bedside at this time

## 2021-09-10 NOTE — ED Triage Notes (Signed)
Fell last Tuesday. Pain right leg, no deformity, taking Tramadol, bruising upper thigh, lower butt area. No rotation. 176/100, took BP meds.

## 2021-09-10 NOTE — ED Notes (Signed)
Pt c/o nausea. Provider made aware and ordered meds at this time

## 2021-09-11 ENCOUNTER — Encounter (HOSPITAL_COMMUNITY)
Admission: EM | Disposition: A | Discharge status: Skilled Nursing Facility | Payer: Self-pay | Source: Home / Self Care | Attending: Internal Medicine

## 2021-09-11 ENCOUNTER — Encounter (HOSPITAL_COMMUNITY): Payer: Self-pay | Admitting: Anesthesiology

## 2021-09-11 ENCOUNTER — Other Ambulatory Visit: Payer: Self-pay

## 2021-09-11 ENCOUNTER — Inpatient Hospital Stay (HOSPITAL_COMMUNITY): Payer: Medicare PPO

## 2021-09-11 ENCOUNTER — Inpatient Hospital Stay (HOSPITAL_COMMUNITY): Payer: Medicare PPO | Admitting: Anesthesiology

## 2021-09-11 DIAGNOSIS — I10 Essential (primary) hypertension: Secondary | ICD-10-CM

## 2021-09-11 DIAGNOSIS — E039 Hypothyroidism, unspecified: Secondary | ICD-10-CM

## 2021-09-11 DIAGNOSIS — S72001A Fracture of unspecified part of neck of right femur, initial encounter for closed fracture: Secondary | ICD-10-CM | POA: Diagnosis not present

## 2021-09-11 DIAGNOSIS — E440 Moderate protein-calorie malnutrition: Secondary | ICD-10-CM | POA: Insufficient documentation

## 2021-09-11 DIAGNOSIS — W19XXXA Unspecified fall, initial encounter: Secondary | ICD-10-CM

## 2021-09-11 DIAGNOSIS — E782 Mixed hyperlipidemia: Secondary | ICD-10-CM

## 2021-09-11 DIAGNOSIS — Z96641 Presence of right artificial hip joint: Secondary | ICD-10-CM

## 2021-09-11 HISTORY — PX: TOTAL HIP ARTHROPLASTY: SHX124

## 2021-09-11 LAB — SURGICAL PCR SCREEN
MRSA, PCR: NEGATIVE
Staphylococcus aureus: NEGATIVE

## 2021-09-11 LAB — TYPE AND SCREEN
ABO/RH(D): O POS
Antibody Screen: NEGATIVE

## 2021-09-11 LAB — TSH: TSH: 1.791 u[IU]/mL (ref 0.350–4.500)

## 2021-09-11 SURGERY — ARTHROPLASTY, HIP, TOTAL, ANTERIOR APPROACH
Anesthesia: General | Site: Hip | Laterality: Right

## 2021-09-11 MED ORDER — DEXAMETHASONE SODIUM PHOSPHATE 10 MG/ML IJ SOLN
INTRAMUSCULAR | Status: AC
  Filled 2021-09-11: qty 1

## 2021-09-11 MED ORDER — POLYETHYLENE GLYCOL 3350 17 G PO PACK: 17.0000 g | PACK | Freq: Every day | ORAL | Status: DC | PRN

## 2021-09-11 MED ORDER — DIPHENHYDRAMINE HCL 12.5 MG/5ML PO ELIX: 12.5000 mg | ORAL_SOLUTION | ORAL | Status: DC | PRN

## 2021-09-11 MED ORDER — MEPERIDINE HCL 50 MG/ML IJ SOLN: 6.2500 mg | INTRAMUSCULAR | Status: DC | PRN

## 2021-09-11 MED ORDER — SODIUM CHLORIDE 0.9 % IV SOLN: INTRAVENOUS | Status: DC

## 2021-09-11 MED ORDER — ONDANSETRON HCL 4 MG/2ML IJ SOLN
4.0000 mg | Freq: Four times a day (QID) | INTRAMUSCULAR | Status: DC | PRN
  Filled 2021-09-11: qty 2

## 2021-09-11 MED ORDER — PROPOFOL 10 MG/ML IV BOLUS
INTRAVENOUS | Status: DC | PRN
  Administered 2021-09-11: 120 mg via INTRAVENOUS

## 2021-09-11 MED ORDER — LIDOCAINE HCL (PF) 2 % IJ SOLN
INTRAMUSCULAR | Status: AC
  Filled 2021-09-11: qty 5

## 2021-09-11 MED ORDER — METHOCARBAMOL 500 MG IVPB - SIMPLE MED
INTRAVENOUS | Status: AC
  Administered 2021-09-11: 18:00:00 500 mg via INTRAVENOUS
  Filled 2021-09-11: qty 50

## 2021-09-11 MED ORDER — METHOCARBAMOL 500 MG PO TABS
500.0000 mg | ORAL_TABLET | Freq: Four times a day (QID) | ORAL | Status: DC | PRN
  Administered 2021-09-12 – 2021-09-13 (×3): 500 mg via ORAL
  Filled 2021-09-11 (×3): qty 1

## 2021-09-11 MED ORDER — PHENYLEPHRINE HCL (PRESSORS) 10 MG/ML IV SOLN
INTRAVENOUS | Status: AC
  Filled 2021-09-11: qty 1

## 2021-09-11 MED ORDER — POVIDONE-IODINE 10 % EX SWAB
2.0000 "application " | Freq: Once | CUTANEOUS | Status: AC
  Administered 2021-09-11: 2 via TOPICAL

## 2021-09-11 MED ORDER — MELATONIN 5 MG PO TABS
5.0000 mg | ORAL_TABLET | Freq: Every day | ORAL | Status: DC
  Administered 2021-09-11 – 2021-09-13 (×4): 5 mg via ORAL
  Filled 2021-09-11 (×4): qty 1

## 2021-09-11 MED ORDER — DEXAMETHASONE SODIUM PHOSPHATE 10 MG/ML IJ SOLN
INTRAMUSCULAR | Status: DC | PRN
  Administered 2021-09-11: 8 mg via INTRAVENOUS

## 2021-09-11 MED ORDER — STERILE WATER FOR IRRIGATION IR SOLN
Status: DC | PRN
  Administered 2021-09-11: 2000 mL

## 2021-09-11 MED ORDER — CITALOPRAM HYDROBROMIDE 20 MG PO TABS
20.0000 mg | ORAL_TABLET | Freq: Every day | ORAL | Status: DC
  Administered 2021-09-11 – 2021-09-14 (×4): 20 mg via ORAL
  Filled 2021-09-11 (×2): qty 1
  Filled 2021-09-11: qty 2
  Filled 2021-09-11: qty 1

## 2021-09-11 MED ORDER — PANTOPRAZOLE SODIUM 40 MG PO TBEC
80.0000 mg | DELAYED_RELEASE_TABLET | Freq: Every day | ORAL | Status: DC
  Administered 2021-09-11 – 2021-09-14 (×4): 80 mg via ORAL
  Filled 2021-09-11 (×4): qty 2

## 2021-09-11 MED ORDER — PHENYLEPHRINE 40 MCG/ML (10ML) SYRINGE FOR IV PUSH (FOR BLOOD PRESSURE SUPPORT)
PREFILLED_SYRINGE | INTRAVENOUS | Status: AC
  Filled 2021-09-11: qty 10

## 2021-09-11 MED ORDER — FENTANYL CITRATE PF 50 MCG/ML IJ SOSY
PREFILLED_SYRINGE | INTRAMUSCULAR | Status: AC
  Administered 2021-09-11: 19:00:00 25 ug via INTRAVENOUS
  Filled 2021-09-11: qty 1

## 2021-09-11 MED ORDER — SODIUM CHLORIDE 0.9 % IR SOLN
Status: DC | PRN
  Administered 2021-09-11: 1000 mL

## 2021-09-11 MED ORDER — ALBUTEROL SULFATE 0.63 MG/3ML IN NEBU: 1.0000 | INHALATION_SOLUTION | RESPIRATORY_TRACT | Status: DC | PRN

## 2021-09-11 MED ORDER — MORPHINE SULFATE (PF) 2 MG/ML IV SOLN: 0.5000 mg | INTRAVENOUS | Status: DC | PRN

## 2021-09-11 MED ORDER — LISINOPRIL 20 MG PO TABS
40.0000 mg | ORAL_TABLET | Freq: Every day | ORAL | Status: DC
  Administered 2021-09-11 – 2021-09-13 (×3): 40 mg via ORAL
  Filled 2021-09-11 (×4): qty 2

## 2021-09-11 MED ORDER — LEVOTHYROXINE SODIUM 25 MCG PO TABS
25.0000 ug | ORAL_TABLET | Freq: Every day | ORAL | Status: DC
  Administered 2021-09-11 – 2021-09-14 (×4): 25 ug via ORAL
  Filled 2021-09-11 (×4): qty 1

## 2021-09-11 MED ORDER — ONDANSETRON HCL 4 MG/2ML IJ SOLN
INTRAMUSCULAR | Status: DC | PRN
  Administered 2021-09-11: 4 mg via INTRAVENOUS

## 2021-09-11 MED ORDER — FENTANYL CITRATE PF 50 MCG/ML IJ SOSY
25.0000 ug | PREFILLED_SYRINGE | INTRAMUSCULAR | Status: DC | PRN
  Administered 2021-09-11: 18:00:00 25 ug via INTRAVENOUS
  Administered 2021-09-11: 19:00:00 50 ug via INTRAVENOUS

## 2021-09-11 MED ORDER — CEFAZOLIN SODIUM-DEXTROSE 2-4 GM/100ML-% IV SOLN
2.0000 g | INTRAVENOUS | Status: AC
  Administered 2021-09-11: 16:00:00 2 g via INTRAVENOUS

## 2021-09-11 MED ORDER — BISACODYL 10 MG RE SUPP
10.0000 mg | Freq: Every day | RECTAL | Status: DC | PRN
  Administered 2021-09-14: 10 mg via RECTAL
  Filled 2021-09-11: qty 1

## 2021-09-11 MED ORDER — SODIUM CHLORIDE 0.9 % IV SOLN: INTRAVENOUS | Status: AC

## 2021-09-11 MED ORDER — ADULT MULTIVITAMIN W/MINERALS CH
1.0000 | ORAL_TABLET | Freq: Every day | ORAL | Status: DC
  Administered 2021-09-12 – 2021-09-14 (×3): 1 via ORAL
  Filled 2021-09-11 (×3): qty 1

## 2021-09-11 MED ORDER — HYDROCODONE-ACETAMINOPHEN 5-325 MG PO TABS
1.0000 | ORAL_TABLET | ORAL | Status: DC | PRN
  Administered 2021-09-12: 22:00:00 1 via ORAL
  Filled 2021-09-11: qty 1

## 2021-09-11 MED ORDER — FENTANYL CITRATE (PF) 100 MCG/2ML IJ SOLN
INTRAMUSCULAR | Status: AC
  Filled 2021-09-11: qty 2

## 2021-09-11 MED ORDER — ONDANSETRON HCL 4 MG PO TABS: 4.0000 mg | ORAL_TABLET | Freq: Four times a day (QID) | ORAL | Status: DC | PRN

## 2021-09-11 MED ORDER — OXYCODONE HCL 5 MG PO TABS: 5.0000 mg | ORAL_TABLET | Freq: Once | ORAL | Status: DC | PRN

## 2021-09-11 MED ORDER — FENTANYL CITRATE (PF) 100 MCG/2ML IJ SOLN
INTRAMUSCULAR | Status: DC | PRN
  Administered 2021-09-11: 25 ug via INTRAVENOUS
  Administered 2021-09-11: 50 ug via INTRAVENOUS
  Administered 2021-09-11: 25 ug via INTRAVENOUS

## 2021-09-11 MED ORDER — FLUTICASONE PROPIONATE 50 MCG/ACT NA SUSP
2.0000 | Freq: Every day | NASAL | Status: DC
  Administered 2021-09-12 – 2021-09-13 (×2): 2 via NASAL
  Filled 2021-09-11: qty 16

## 2021-09-11 MED ORDER — ASPIRIN 81 MG PO CHEW
81.0000 mg | CHEWABLE_TABLET | Freq: Two times a day (BID) | ORAL | Status: DC
  Administered 2021-09-12 – 2021-09-14 (×5): 81 mg via ORAL
  Filled 2021-09-11 (×5): qty 1

## 2021-09-11 MED ORDER — ONDANSETRON HCL 4 MG/2ML IJ SOLN
INTRAMUSCULAR | Status: AC
  Filled 2021-09-11: qty 2

## 2021-09-11 MED ORDER — DILTIAZEM HCL ER COATED BEADS 120 MG PO CP24
120.0000 mg | ORAL_CAPSULE | Freq: Every day | ORAL | Status: DC
  Administered 2021-09-11 – 2021-09-14 (×4): 120 mg via ORAL
  Filled 2021-09-11 (×4): qty 1

## 2021-09-11 MED ORDER — ACETAMINOPHEN 10 MG/ML IV SOLN
INTRAVENOUS | Status: DC | PRN
  Administered 2021-09-11: 1000 mg via INTRAVENOUS

## 2021-09-11 MED ORDER — PHENYLEPHRINE 40 MCG/ML (10ML) SYRINGE FOR IV PUSH (FOR BLOOD PRESSURE SUPPORT)
PREFILLED_SYRINGE | INTRAVENOUS | Status: DC | PRN
  Administered 2021-09-11 (×2): 80 ug via INTRAVENOUS
  Administered 2021-09-11: 120 ug via INTRAVENOUS
  Administered 2021-09-11: 80 ug via INTRAVENOUS
  Administered 2021-09-11: 40 ug via INTRAVENOUS

## 2021-09-11 MED ORDER — ACETAMINOPHEN 325 MG PO TABS: 325.0000 mg | ORAL_TABLET | ORAL | Status: DC | PRN

## 2021-09-11 MED ORDER — LEVOCETIRIZINE DIHYDROCHLORIDE 5 MG PO TABS: 5.0000 mg | ORAL_TABLET | Freq: Every evening | ORAL | Status: DC

## 2021-09-11 MED ORDER — LIP MEDEX EX OINT
TOPICAL_OINTMENT | CUTANEOUS | Status: DC | PRN
  Administered 2021-09-12: 75 via TOPICAL
  Filled 2021-09-11 (×2): qty 7

## 2021-09-11 MED ORDER — FENTANYL CITRATE PF 50 MCG/ML IJ SOSY
PREFILLED_SYRINGE | INTRAMUSCULAR | Status: AC
  Administered 2021-09-11: 18:00:00 25 ug via INTRAVENOUS
  Filled 2021-09-11: qty 2

## 2021-09-11 MED ORDER — MOMETASONE FURO-FORMOTEROL FUM 200-5 MCG/ACT IN AERO
2.0000 | INHALATION_SPRAY | Freq: Two times a day (BID) | RESPIRATORY_TRACT | Status: DC
  Administered 2021-09-11 – 2021-09-14 (×6): 2 via RESPIRATORY_TRACT
  Filled 2021-09-11: qty 8.8

## 2021-09-11 MED ORDER — SODIUM CHLORIDE 0.9 % IV SOLN
6.2500 mg | Freq: Four times a day (QID) | INTRAVENOUS | Status: DC | PRN
  Administered 2021-09-11: 6.25 mg via INTRAVENOUS
  Filled 2021-09-11: qty 0.25

## 2021-09-11 MED ORDER — ACETAMINOPHEN 10 MG/ML IV SOLN
INTRAVENOUS | Status: AC
  Filled 2021-09-11: qty 100

## 2021-09-11 MED ORDER — ACETAMINOPHEN 325 MG PO TABS
325.0000 mg | ORAL_TABLET | Freq: Four times a day (QID) | ORAL | Status: DC | PRN
  Administered 2021-09-13: 650 mg via ORAL
  Filled 2021-09-11: qty 2

## 2021-09-11 MED ORDER — METOCLOPRAMIDE HCL 5 MG/ML IJ SOLN: 5.0000 mg | Freq: Three times a day (TID) | INTRAMUSCULAR | Status: DC | PRN

## 2021-09-11 MED ORDER — TRANEXAMIC ACID-NACL 1000-0.7 MG/100ML-% IV SOLN
1000.0000 mg | INTRAVENOUS | Status: AC
  Administered 2021-09-11: 17:00:00 1000 mg via INTRAVENOUS

## 2021-09-11 MED ORDER — CEFAZOLIN SODIUM-DEXTROSE 2-4 GM/100ML-% IV SOLN
2.0000 g | Freq: Four times a day (QID) | INTRAVENOUS | Status: AC
  Administered 2021-09-11 – 2021-09-12 (×2): 2 g via INTRAVENOUS
  Filled 2021-09-11 (×2): qty 100

## 2021-09-11 MED ORDER — TRANEXAMIC ACID-NACL 1000-0.7 MG/100ML-% IV SOLN
INTRAVENOUS | Status: AC
  Filled 2021-09-11: qty 100

## 2021-09-11 MED ORDER — DEXAMETHASONE SODIUM PHOSPHATE 10 MG/ML IJ SOLN
10.0000 mg | Freq: Once | INTRAMUSCULAR | Status: AC
  Administered 2021-09-12: 10:00:00 10 mg via INTRAVENOUS
  Filled 2021-09-11: qty 1

## 2021-09-11 MED ORDER — ACETAMINOPHEN 160 MG/5ML PO SOLN: 325.0000 mg | ORAL | Status: DC | PRN

## 2021-09-11 MED ORDER — SIMVASTATIN 10 MG PO TABS
10.0000 mg | ORAL_TABLET | Freq: Every day | ORAL | Status: DC
  Administered 2021-09-11 – 2021-09-13 (×4): 10 mg via ORAL
  Filled 2021-09-11 (×4): qty 1

## 2021-09-11 MED ORDER — CEFAZOLIN SODIUM-DEXTROSE 2-4 GM/100ML-% IV SOLN
INTRAVENOUS | Status: AC
  Filled 2021-09-11: qty 100

## 2021-09-11 MED ORDER — ALBUTEROL SULFATE (2.5 MG/3ML) 0.083% IN NEBU: 3.0000 mL | INHALATION_SOLUTION | Freq: Two times a day (BID) | RESPIRATORY_TRACT | Status: DC | PRN

## 2021-09-11 MED ORDER — LORATADINE 10 MG PO TABS
10.0000 mg | ORAL_TABLET | Freq: Every day | ORAL | Status: DC
  Administered 2021-09-11 – 2021-09-14 (×4): 10 mg via ORAL
  Filled 2021-09-11 (×4): qty 1

## 2021-09-11 MED ORDER — POVIDONE-IODINE 10 % EX SWAB: 2.0000 "application " | Freq: Once | CUTANEOUS | Status: AC

## 2021-09-11 MED ORDER — LABETALOL HCL 5 MG/ML IV SOLN
5.0000 mg | INTRAVENOUS | Status: DC | PRN
  Filled 2021-09-11: qty 4

## 2021-09-11 MED ORDER — METHOCARBAMOL 500 MG IVPB - SIMPLE MED
500.0000 mg | Freq: Four times a day (QID) | INTRAVENOUS | Status: DC | PRN
Start: 2021-09-11 — End: 2021-09-14
  Filled 2021-09-11: qty 50

## 2021-09-11 MED ORDER — MENTHOL 3 MG MT LOZG: 1.0000 | LOZENGE | OROMUCOSAL | Status: DC | PRN

## 2021-09-11 MED ORDER — DOCUSATE SODIUM 100 MG PO CAPS
100.0000 mg | ORAL_CAPSULE | Freq: Two times a day (BID) | ORAL | Status: DC
  Administered 2021-09-11 – 2021-09-14 (×6): 100 mg via ORAL
  Filled 2021-09-11 (×6): qty 1

## 2021-09-11 MED ORDER — PHENOL 1.4 % MT LIQD: 1.0000 | OROMUCOSAL | Status: DC | PRN

## 2021-09-11 MED ORDER — PHENYLEPHRINE HCL-NACL 20-0.9 MG/250ML-% IV SOLN
INTRAVENOUS | Status: DC | PRN
  Administered 2021-09-11: 50 ug/min via INTRAVENOUS

## 2021-09-11 MED ORDER — MONTELUKAST SODIUM 10 MG PO TABS
10.0000 mg | ORAL_TABLET | Freq: Every day | ORAL | Status: DC
  Administered 2021-09-11 – 2021-09-13 (×4): 10 mg via ORAL
  Filled 2021-09-11 (×4): qty 1

## 2021-09-11 MED ORDER — METOCLOPRAMIDE HCL 5 MG PO TABS: 5.0000 mg | ORAL_TABLET | Freq: Three times a day (TID) | ORAL | Status: DC | PRN

## 2021-09-11 MED ORDER — TRANEXAMIC ACID-NACL 1000-0.7 MG/100ML-% IV SOLN
1000.0000 mg | Freq: Once | INTRAVENOUS | Status: AC
  Administered 2021-09-11: 21:00:00 1000 mg via INTRAVENOUS
  Filled 2021-09-11: qty 100

## 2021-09-11 MED ORDER — MUPIROCIN 2 % EX OINT: 1.0000 "application " | TOPICAL_OINTMENT | Freq: Two times a day (BID) | CUTANEOUS | Status: DC

## 2021-09-11 MED ORDER — FERROUS SULFATE 325 (65 FE) MG PO TABS
325.0000 mg | ORAL_TABLET | Freq: Three times a day (TID) | ORAL | Status: DC
  Administered 2021-09-12 – 2021-09-14 (×8): 325 mg via ORAL
  Filled 2021-09-11 (×8): qty 1

## 2021-09-11 MED ORDER — PROPOFOL 10 MG/ML IV BOLUS
INTRAVENOUS | Status: AC
  Filled 2021-09-11: qty 20

## 2021-09-11 MED ORDER — LIDOCAINE HCL (CARDIAC) PF 100 MG/5ML IV SOSY
PREFILLED_SYRINGE | INTRAVENOUS | Status: DC | PRN
Start: 2021-09-11 — End: 2021-09-11
  Administered 2021-09-11: 60 mg via INTRAVENOUS

## 2021-09-11 MED ORDER — CHLORHEXIDINE GLUCONATE 4 % EX LIQD
60.0000 mL | Freq: Once | CUTANEOUS | Status: AC
  Administered 2021-09-11: 4 via TOPICAL
  Filled 2021-09-11: qty 60

## 2021-09-11 MED ORDER — OXYCODONE HCL 5 MG/5ML PO SOLN: 5.0000 mg | Freq: Once | ORAL | Status: DC | PRN

## 2021-09-11 MED ORDER — VENLAFAXINE HCL ER 75 MG PO CP24
75.0000 mg | ORAL_CAPSULE | Freq: Every day | ORAL | Status: DC
  Administered 2021-09-11 – 2021-09-14 (×4): 75 mg via ORAL
  Filled 2021-09-11 (×4): qty 1

## 2021-09-11 MED ORDER — ONDANSETRON HCL 4 MG/2ML IJ SOLN: 4.0000 mg | Freq: Once | INTRAMUSCULAR | Status: DC | PRN

## 2021-09-11 MED ORDER — KETOROLAC TROMETHAMINE 30 MG/ML IJ SOLN
INTRAMUSCULAR | Status: DC | PRN
Start: 2021-09-11 — End: 2021-09-11
  Administered 2021-09-11: 15 mg via INTRAVENOUS

## 2021-09-11 SURGICAL SUPPLY — 44 items
ADH SKN CLS APL DERMABOND .7 (GAUZE/BANDAGES/DRESSINGS) ×1
BAG COUNTER SPONGE SURGICOUNT (BAG) IMPLANT
BAG DECANTER FOR FLEXI CONT (MISCELLANEOUS) IMPLANT
BAG SPEC THK2 15X12 ZIP CLS (MISCELLANEOUS)
BAG SPNG CNTER NS LX DISP (BAG)
BAG ZIPLOCK 12X15 (MISCELLANEOUS) IMPLANT
BLADE SAG 18X100X1.27 (BLADE) ×2 IMPLANT
COVER PERINEAL POST (MISCELLANEOUS) ×2 IMPLANT
COVER SURGICAL LIGHT HANDLE (MISCELLANEOUS) ×2 IMPLANT
CUP ACET PINNACLE SECTR 50MM (Hips) IMPLANT
DERMABOND ADVANCED (GAUZE/BANDAGES/DRESSINGS) ×1
DERMABOND ADVANCED .7 DNX12 (GAUZE/BANDAGES/DRESSINGS) ×1 IMPLANT
DRAPE FOOT SWITCH (DRAPES) ×2 IMPLANT
DRAPE STERI IOBAN 125X83 (DRAPES) ×2 IMPLANT
DRAPE U-SHAPE 47X51 STRL (DRAPES) ×4 IMPLANT
DRESSING AQUACEL AG SP 3.5X10 (GAUZE/BANDAGES/DRESSINGS) ×1 IMPLANT
DRSG AQUACEL AG SP 3.5X10 (GAUZE/BANDAGES/DRESSINGS) ×2
DURAPREP 26ML APPLICATOR (WOUND CARE) ×2 IMPLANT
ELECT REM PT RETURN 15FT ADLT (MISCELLANEOUS) ×2 IMPLANT
ELIMINATOR HOLE APEX DEPUY (Hips) ×1 IMPLANT
GLOVE SURG ENC MOIS LTX SZ6 (GLOVE) ×2 IMPLANT
GLOVE SURG ENC MOIS LTX SZ7 (GLOVE) ×3 IMPLANT
GLOVE SURG UNDER LTX SZ6.5 (GLOVE) ×2 IMPLANT
GLOVE SURG UNDER POLY LF SZ7.5 (GLOVE) ×2 IMPLANT
GOWN STRL REUS W/TWL LRG LVL3 (GOWN DISPOSABLE) ×4 IMPLANT
HEAD FEM STD 32X+1 STRL (Hips) ×1 IMPLANT
HOLDER FOLEY CATH W/STRAP (MISCELLANEOUS) ×2 IMPLANT
KIT TURNOVER KIT A (KITS) IMPLANT
LINER ACET PNNCL PLUS4 NEUTRAL (Hips) IMPLANT
PACK ANTERIOR HIP CUSTOM (KITS) ×2 IMPLANT
PINNACLE PLUS 4 NEUTRAL (Hips) ×2 IMPLANT
PINNACLE SECTOR CUP 50MM (Hips) ×2 IMPLANT
SCREW 6.5MMX30MM (Screw) ×1 IMPLANT
SPONGE T-LAP 18X18 ~~LOC~~+RFID (SPONGE) ×9 IMPLANT
STEM FEM ACTIS HIGH SZ2 (Stem) ×1 IMPLANT
SUT MNCRL AB 4-0 PS2 18 (SUTURE) ×2 IMPLANT
SUT STRATAFIX 0 PDS 27 VIOLET (SUTURE) ×2
SUT VIC AB 1 CT1 36 (SUTURE) ×9 IMPLANT
SUT VIC AB 2-0 CT1 27 (SUTURE) ×4
SUT VIC AB 2-0 CT1 TAPERPNT 27 (SUTURE) ×2 IMPLANT
SUTURE STRATFX 0 PDS 27 VIOLET (SUTURE) ×1 IMPLANT
TRAY FOLEY MTR SLVR 16FR STAT (SET/KITS/TRAYS/PACK) IMPLANT
TUBE SUCTION HIGH CAP CLEAR NV (SUCTIONS) ×2 IMPLANT
WATER STERILE IRR 1000ML POUR (IV SOLUTION) ×2 IMPLANT

## 2021-09-11 NOTE — ED Notes (Signed)
[facility] contacted at this time by Network engineer

## 2021-09-11 NOTE — Assessment & Plan Note (Signed)
Cont Statin. ?

## 2021-09-11 NOTE — ED Notes (Signed)
Admit provider at bedside at this time 

## 2021-09-11 NOTE — ED Notes (Signed)
[facility] arrived for transport 

## 2021-09-11 NOTE — Transfer of Care (Signed)
Immediate Anesthesia Transfer of Care Note  Patient: Valerie Decker  Procedure(s) Performed: TOTAL HIP ARTHROPLASTY ANTERIOR APPROACH (Right: Hip)  Patient Location: PACU  Anesthesia Type:General  Level of Consciousness: drowsy and patient cooperative  Airway & Oxygen Therapy: Patient Spontanous Breathing and Patient connected to face mask oxygen  Post-op Assessment: Report given to RN and Post -op Vital signs reviewed and stable  Post vital signs: Reviewed and stable  Last Vitals:  Vitals Value Taken Time  BP 147/65 09/11/21 1753  Temp    Pulse 80 09/11/21 1759  Resp 15 09/11/21 1759  SpO2 100 % 09/11/21 1759  Vitals shown include unvalidated device data.  Last Pain:  Vitals:   09/11/21 1358  TempSrc: Oral  PainSc:       Patients Stated Pain Goal: 2 (97/28/20 6015)  Complications: No notable events documented.

## 2021-09-11 NOTE — Consult Note (Signed)
Anesthesia consult for nerve block received related to patient's hip fracture. Unfortunately, patient has already been transferred to [facility] in preparation for her surgery later today. Unable to perform nerve block given transfer.

## 2021-09-11 NOTE — Progress Notes (Signed)
Chaplain engaged in an initial visit with Valerie Decker, her son, and daughter in [employer].  Chaplain offered prayer with them as Valerie Decker prepares for a procedure.  Valerie Decker has a great deal of support and appeared to be in good spirits despite experiencing some nausea.    Chaplain offered presence, prayer, support, and listening.    09/11/21 1400  Clinical Encounter Type  Visited With Patient and family together  Visit Type Initial;Spiritual support  Referral From Palliative care team  Consult/Referral To Chaplain  Spiritual Encounters  Spiritual Needs Prayer

## 2021-09-11 NOTE — Anesthesia Procedure Notes (Signed)
Procedure Name: LMA Insertion Date/Time: 09/11/2021 4:24 PM Performed by: Lind Covert, CRNA Pre-anesthesia Checklist: Patient identified, Emergency Drugs available, Suction available and Patient being monitored Patient Re-evaluated:Patient Re-evaluated prior to induction Oxygen Delivery Method: Circle system utilized Preoxygenation: Pre-oxygenation with 100% oxygen Induction Type: IV induction LMA: LMA inserted LMA Size: 4.0 Tube type: Oral Number of attempts: 1 Placement Confirmation: positive ETCO2 and breath sounds checked- equal and bilateral Tube secured with: Tape Dental Injury: Teeth and Oropharynx as per pre-operative assessment

## 2021-09-11 NOTE — Discharge Instructions (Addendum)
High Calorie, High Protein Nutrition Therapy  A high-calorie, high-protein diet has been recommended to you. Your registered dietitian nutritionist (RDN) may have recommended this diet because you are having difficulty eating enough calories throughout the day, you have lost weight, and/or you need to add protein to your diet.  Sometimes you may not feel like eating, even if you know the importance of good nutrition. The recommendations in this handout can help you with the following:  Regaining your strength and energy Keeping your body healthy Healing and recovering from surgery or illness and fighting infection  Schedule Your Meals and Snacks  Several small meals and snacks are often better tolerated and digested than large meals.  Strategies  Plan to eat 3 meals and 3 snacks daily. Experiment with timing meals to find out when you have a larger appetite. Appetite may be greatest in the morning after not eating all night so you may prefer to eat your larger meals and snacks in the morning and at lunch. Breakfast-type foods are often better tolerated so eat foods such as eggs, pancakes, waffles and cereal for any meal or snack. Carry snacks with you so you are prepared to eat every 2 to 3 hours. Determine what works best for you if your bodys cues for feeling hungry or full are not working. Eat a small meal or snack even if you dont feel hungry. Set a timer to remind you when it is time to eat. Take a walk before you eat (with health care providers approval). Light or moderate physical activity can help you maintain muscle and increase your appetite.  Make Eating Enjoyable  Taking steps to make the experience enjoyable may help to increase your interest in eating and improve your appetite.  Strategies:  Eat with others whenever possible. Include your favorite foods to make meals more enjoyable. Try new foods. Save your beverage for the end of the meal so that you have more  room for food before you get full.  Add Calories to Your Meals and Snacks  Try adding calorie-dense foods so that each bite provides more nutrition.  Strategies  Drink milk, chocolate milk, soy milk, or smoothies instead of low-calorie beverages such as diet drinks or water. Cook with milk or soy milk instead of water when making dishes such as hot cereal, cocoa, or pudding. Add jelly, jam, honey, butter or margarine to bread and crackers. Add jam or fruit to ice cream and as a topping over cake. Mix dried fruit, nuts, granola, honey, or dry cereal with yogurt or hot cereals. Enjoy snacks such as milkshakes, smoothies, pudding, ice cream, or custard. Blend a fruit smoothie of a banana, frozen berries, milk or soy milk, and 1 tablespoon nonfat powdered milk or protein powder.  Add Protein to Your Meals and Snacks  Choose at least one protein food at each meal and snack to increase your daily intake.  Strategies  Add  cup nonfat dry milk powder or protein powder to make a high-protein milk to drink or to use in recipes that call for milk. Vanilla or peppermint extract or unsweetened cocoa powder could help to boost the flavor. Add hard-cooked eggs, leftover meat, grated cheese, canned beans or tofu to noodles, rice, salads, sandwiches, soups, casseroles, pasta, tuna and other mixed dishes. Add powdered milk or protein powder to hot cereals, meatloaf, casseroles, scrambled eggs, sauces, cream soups, and shakes. Add beans and lentils to salads, soups, casseroles, and vegetable dishes. Eat cottage cheese or yogurt, especially  [place] yogurt, with fruit as a snack or dessert. Eat peanut or other nut butters on crackers, bread, toast, waffles, apples, bananas or celery sticks. Add it to milkshakes, smoothies, or desserts. Consider a ready-made protein shake.   Add Fats to Your Meals and Snacks  Try adding fats to your meals and snacks. Fat provides more calories in fewer bites than  carbohydrate or protein and adds flavors to your foods.  Strategies  Snack on nuts and seeds or add them to foods like salads, pasta, cereals, yogurt, and ice cream.  Saut or stir-fry vegetables, meats, chicken, fish or tofu in olive or canola oil.  Add olive oil, other vegetable oils, butter or margarine to soups, vegetables, potatoes, cooked cereal, rice, pasta, bread, crackers, pancakes, or waffles. Snack on olives or add to pasta, pizza, or salad. Add avocado or guacamole to your salads, sandwiches, and other entrees. Include fatty fish such as salmon in your weekly meal plan.  Tips For Adding Protein Nutrition Therapy    Tips Add extra egg to one or more meals  Increase the portion of milk to drink and change to skim milk if able  Include [place] yogurt or cottage cheese for snack or part of a meal  Increase portion size of protein entre and decrease portion of starch/bread  Mix protein powder, nut butter, almond/nut milk, non-fat dry milk, or Greek yogurt to shakes and smoothies  Use these ingredients also in baked goods or other recipes Use double the amount of sandwich filling  Add protein foods to all snacks including cheese, nut butters, milk and yogurt  Food Tips for Including Protein  Beans Cook and use dried peas, beans, and tofu in soups or add to casseroles, pastas, and grain dishes that also contain cheese or meat  Mash with cheese and milk  Use tofu to make smoothies  Commercial Protein Supplements Use nutritional supplements or protein powder sold at pharmacies and grocery stores  Use protein powder in milk drinks and desserts, such as pudding  Mix with ice cream, milk, and fruit or other flavorings for a high-protein milkshake  Cottage Cheese or [employer] Mix with or use to stuff fruits and vegetables  Add to casseroles, spaghetti, noodles, or egg dishes such as omelets, scrambled eggs, and souffls  Use gelatin, pudding-type desserts, cheesecake, and pancake  or waffle batter  Use to stuff crepes, pasta shells, or manicotti  Puree and use as a substitute for sour cream  Eggs, Egg whites, and Egg Yolks Add chopped, hard-cooked eggs to salads and dressings, vegetables, casseroles, and creamed meats  Beat eggs into mashed potatoes, vegetable purees, and sauces  Add extra egg whites to quiches, scrambled eggs, custards, puddings, pancake batter, or [place] toast wash/batter  Make a rich custard with egg yolks, double strength milk, and sugar  Add extra hard-cooked yolks to deviled egg filling and sandwich spreads  Hard or Semi-Soft Cheese (Cheddar, Barnabas Lister, [place]) Melt on sandwiches, bread, muffins, tortillas, hamburgers, hot dogs, other meats or fish, vegetables, eggs, or desserts such as stewed figs or pies  Grate and add to soups, sauces, casseroles, vegetable dishes, potatoes, rice noodles, or meatloaf  Serve as a snack with crackers or bagels  Ice cream, Yogurt, and Frozen Yogurt Add to milk drinks such as milkshakes  Add to cereals, fruits, gelatin desserts, and pies  Blend or whip with soft or cooked fruits  Sandwich ice cream or frozen yogurt between enriched cake slices, cookies, or graham crackers  Use seasoned  yogurt as a dip for fruits, vegetables, or chips  Use yogurt in place of sour cream in casseroles  Meat and Fish Add chopped, cooked meat or fish to vegetables, salads, casseroles, soups, sauces, and biscuit dough  Use in omelets, souffls, quiches, and sandwich fillings  Add chicken and [place] to stuffing  Wrap in pie crust or biscuit dough as turnovers  Add to stuffed baked potatoes  Add pureed meat to soups  Milk Use in beverages and in cooking  Use in preparing foods, such as hot cereal, soups, cocoa, or pudding  Add cream sauces to vegetable and other dishes  Use evaporated milk, evaporated skim milk, or sweetened condensed milk instead of milk or water in recipes.  Nonfat Dry Milk Add 1/3 cup of nonfat dry milk powdered milk to  each cup of regular milk for double strength milk  Add to yogurt and milk drinks, such as pasteurized eggnog and milkshakes  Add to scrambled eggs and mashed potatoes  Use in casseroles, meatloaf, hot cereal, breads, muffins, sauces, cream soups, puddings and custards, and other milk-based desserts  Nuts, Seeds, and Wheat Germ Add to casseroles, breads, muffins, pancakes, cookies, and waffles  Sprinkle on fruit, cereal, ice cream, yogurt, vegetables, salads, and toast as a crunchy topping  Use in place of breadcrumbs  Blend with parsley or spinach, herbs, and cream for a noodle, pasta, or vegetable sauce.  Roll banana in chopped nuts  Peanut Butter Spread on sandwiches, toast, muffins, crackers, waffles, pancakes, and fruit slices  Use as a dip for raw vegetables, such as carrots, cauliflower, and celery  Blend with milk drinks, smoothies, and other beverages  Swirl through soft ice cream or yogurt  Spread on a banana then roll in crushed, dry cereal or chopped nuts   Small Meal and Snack Ideas  These snacks and meals are recommended when you have to eat but arent necessarily hungry.  They are good choices because they are high in protein and high in calories.   2 graham crackers, 2 tablespoons peanut or other nut butter, 1 cup milk  cup [place] yogurt,  cup fruit,  cup granola 2 deviled egg halves, 5 whole wheat crackers 1 cup cream of tomato soup,  grilled cheese sandwich 1 toasted waffle topped with: 2 tablespoons peanut or nut butter, 1 tablespoon jam Trail mix made with:  cup nuts,  cup dried fruit,  cup cold cereal, any variety  cup oatmeal or cream of wheat cereal, 1 tablespoon peanut or nut butter,  cup diced fruit  High-Calorie, High-Protein Sample 1-Day Menu   Breakfast 1 egg, scrambled 1 ounce cheddar cheese 1 English muffin, whole wheat 1 tablespoon margarine 1 tablespoon jam  cup orange juice, fortified with calcium and vitamin D  Morning Snack 1  tablespoon peanut butter 1 banana 1 cup 1% milk  Lunch Tuna salad sandwich made with: 2 slices bread, whole wheat 3 ounces tuna mixed with: 1 tablespoon mayonnaise  cup pudding  Afternoon Snack  cup hummus  cup carrots 1 pita  Evening Meal Enchilada casserole made with: 2 corn tortillas 3 ounces ground beef, cooked  cup black beans, cooked  cup corn, cooked 1 ounce grated cheddar cheese  cup enchilada sauce  avocado, sliced, topping for enchilada 1 tablespoon sour cream, topping for enchilada Salad:  cup lettuce, shredded  cup tomatoes, chopped, for salad 1 tablespoon olive oil and vinegar dressing, for salad  Evening Snack  cup Greek yogurt  cup blueberries  cup granola  Copyright 2020  [employer]. All rights reserved     INSTRUCTIONS AFTER JOINT REPLACEMENT   Remove items at home which could result in a fall. This includes throw rugs or furniture in walking pathways ICE to the affected joint every three hours while awake for 30 minutes at a time, for at least the first 3-5 days, and then as needed for pain and swelling.  Continue to use ice for pain and swelling. You may notice swelling that will progress down to the foot and ankle.  This is normal after surgery.  Elevate your leg when you are not up walking on it.   Continue to use the breathing machine you got in the hospital (incentive spirometer) which will help keep your temperature down.  It is common for your temperature to cycle up and down following surgery, especially at night when you are not up moving around and exerting yourself.  The breathing machine keeps your lungs expanded and your temperature down.   DIET:  As you were doing prior to hospitalization, we recommend a well-balanced diet.  DRESSING / WOUND CARE / SHOWERING  Keep the surgical dressing until follow up.  The dressing is water proof, so you can shower without any extra covering.  IF THE DRESSING FALLS  OFF or the wound gets wet inside, change the dressing with sterile gauze.  Please use good hand washing techniques before changing the dressing.  Do not use any lotions or creams on the incision until instructed by your surgeon.    ACTIVITY  Increase activity slowly as tolerated, but follow the weight bearing instructions below.   No driving for 6 weeks or until further direction given by your physician.  You cannot drive while taking narcotics.  No lifting or carrying greater than 10 lbs. until further directed by your surgeon. Avoid periods of inactivity such as sitting longer than an hour when not asleep. This helps prevent blood clots.  You may return to work once you are authorized by your doctor.     WEIGHT BEARING   Weight bearing as tolerated with assist device (walker, cane, etc) as directed, use it as long as suggested by your surgeon or therapist, typically at least 4-6 weeks.   EXERCISES  Results after joint replacement surgery are often greatly improved when you follow the exercise, range of motion and muscle strengthening exercises prescribed by your doctor. Safety measures are also important to protect the joint from further injury. Any time any of these exercises cause you to have increased pain or swelling, decrease what you are doing until you are comfortable again and then slowly increase them. If you have problems or questions, call your caregiver or physical therapist for advice.   Rehabilitation is important following a joint replacement. After just a few days of immobilization, the muscles of the leg can become weakened and shrink (atrophy).  These exercises are designed to build up the tone and strength of the thigh and leg muscles and to improve motion. Often times heat used for twenty to thirty minutes before working out will loosen up your tissues and help with improving the range of motion but do not use heat for the first two weeks following surgery (sometimes heat  can increase post-operative swelling).   These exercises can be done on a training (exercise) mat, on the floor, on a table or on a bed. Use whatever works the best and is most comfortable for you.    Use music  or television while you are exercising so that the exercises are a pleasant break in your day. This will make your life better with the exercises acting as a break in your routine that you can look forward to.   Perform all exercises about fifteen times, three times per day or as directed.  You should exercise both the operative leg and the other leg as well.  Exercises include:   Quad Sets - Tighten up the muscle on the front of the thigh (Quad) and hold for 5-10 seconds.   Straight Leg Raises - With your knee straight (if you were given a brace, keep it on), lift the leg to 60 degrees, hold for 3 seconds, and slowly lower the leg.  Perform this exercise against resistance later as your leg gets stronger.  Leg Slides: Lying on your back, slowly slide your foot toward your buttocks, bending your knee up off the floor (only go as far as is comfortable). Then slowly slide your foot back down until your leg is flat on the floor again.  Angel Wings: Lying on your back spread your legs to the side as far apart as you can without causing discomfort.  Hamstring Strength:  Lying on your back, push your heel against the floor with your leg straight by tightening up the muscles of your buttocks.  Repeat, but this time bend your knee to a comfortable angle, and push your heel against the floor.  You may put a pillow under the heel to make it more comfortable if necessary.   A rehabilitation program following joint replacement surgery can speed recovery and prevent re-injury in the future due to weakened muscles. Contact your doctor or a physical therapist for more information on knee rehabilitation.    CONSTIPATION  Constipation is defined medically as fewer than three stools per week and severe  constipation as less than one stool per week.  Even if you have a regular bowel pattern at home, your normal regimen is likely to be disrupted due to multiple reasons following surgery.  Combination of anesthesia, postoperative narcotics, change in appetite and fluid intake all can affect your bowels.   YOU MUST use at least one of the following options; they are listed in order of increasing strength to get the job done.  They are all available over the counter, and you may need to use some, POSSIBLY even all of these options:    Drink plenty of fluids (prune juice may be helpful) and high fiber foods Colace 100 mg by mouth twice a day  Senokot for constipation as directed and as needed Dulcolax (bisacodyl), take with full glass of water  Miralax (polyethylene glycol) once or twice a day as needed.  If you have tried all these things and are unable to have a bowel movement in the first 3-4 days after surgery call either your surgeon or your primary doctor.    If you experience loose stools or diarrhea, hold the medications until you stool forms back up.  If your symptoms do not get better within 1 week or if they get worse, check with your doctor.  If you experience "the worst abdominal pain ever" or develop nausea or vomiting, please contact the office immediately for further recommendations for treatment.   ITCHING:  If you experience itching with your medications, try taking only a single pain pill, or even half a pain pill at a time.  You can also use Benadryl over the counter for itching or  also to help with sleep.   TED HOSE STOCKINGS:  Use stockings on both legs until for at least 2 weeks or as directed by physician office. They may be removed at night for sleeping.  MEDICATIONS:  See your medication summary on the After Visit Summary that nursing will review with you.  You may have some home medications which will be placed on hold until you complete the course of blood thinner  medication.  It is important for you to complete the blood thinner medication as prescribed.  PRECAUTIONS:  If you experience chest pain or shortness of breath - call 911 immediately for transfer to the hospital emergency department.   If you develop a fever greater that 101 F, purulent drainage from wound, increased redness or drainage from wound, foul odor from the wound/dressing, or calf pain - CONTACT YOUR SURGEON.                                                   FOLLOW-UP APPOINTMENTS:  If you do not already have a post-op appointment, please call the office for an appointment to be seen by your surgeon.  Guidelines for how soon to be seen are listed in your After Visit Summary, but are typically between 1-4 weeks after surgery.  OTHER INSTRUCTIONS:   Knee Replacement:  Do not place pillow under knee, focus on keeping the knee straight while resting. CPM instructions: 0-90 degrees, 2 hours in the morning, 2 hours in the afternoon, and 2 hours in the evening. Place foam block, curve side up under heel at all times except when in CPM or when walking.  DO NOT modify, tear, cut, or change the foam block in any way.  POST-OPERATIVE OPIOID TAPER INSTRUCTIONS: It is important to wean off of your opioid medication as soon as possible. If you do not need pain medication after your surgery it is ok to stop day one. Opioids include: Codeine, Hydrocodone(Norco, Vicodin), Oxycodone(Percocet, oxycontin) and hydromorphone amongst others.  Long term and even short term use of opiods can cause: Increased pain response Dependence Constipation Depression Respiratory depression And more.  Withdrawal symptoms can include Flu like symptoms Nausea, vomiting And more Techniques to manage these symptoms Hydrate well Eat regular healthy meals Stay active Use relaxation techniques(deep breathing, meditating, yoga) Do Not substitute Alcohol to help with tapering If you have been on opioids for less than  two weeks and do not have pain than it is ok to stop all together.  Plan to wean off of opioids This plan should start within one week post op of your joint replacement. Maintain the same interval or time between taking each dose and first decrease the dose.  Cut the total daily intake of opioids by one tablet each day Next start to increase the time between doses. The last dose that should be eliminated is the evening dose.   MAKE SURE YOU:  Understand these instructions.  Get help right away if you are not doing well or get worse.    Thank you for letting us be a part of your medical care team.  It is a privilege we respect greatly.  We hope these instructions will help you stay on track for a fast and full recovery!

## 2021-09-11 NOTE — Progress Notes (Signed)
Initial Nutrition Assessment  DOCUMENTATION CODES:   Non-severe (moderate) malnutrition in context of chronic illness  INTERVENTION:   -Magic cup BID with meals, each supplement provides 290 kcal and 9 grams of protein  -Multivitamin with minerals daily  -Placed "High Calorie, High Protein" handout in discharge instructions  NUTRITION DIAGNOSIS:   Moderate Malnutrition related to chronic illness as evidenced by energy intake < or equal to 75% for > or equal to 1 month, moderate fat depletion, moderate muscle depletion.   GOAL:   Patient will meet greater than or equal to 90% of their needs  MONITOR:   PO intake, Weight trends, I & O's, Labs  REASON FOR ASSESSMENT:   Consult Hip fracture protocol  ASSESSMENT:   88 y.o. female who presented to [facility] ED after a fall 1 week prior which resulted in significant hip pain. She reports she had not been able to ambulate since the fall. Imaging in the ED revealed nondisplaced femoral neck fracture  Patient in room, stepdaughter at bedside.  NPO, awaiting surgery for her right hip fracture.  Pt states she has a good appetite, tries to eat 3 meals a day but sometimes skips breakfast d/t sleeping late.   Diet recall: B: eggs, toast, cereal, milk, fruit such as banana or berries L and D: take out, usually some chicken dish or salmon with vegetables Drinks Lactaid milk, sensitive to lactose.  Pt states she doesn't want supplements but is willing to eat more consistently and not skip meals to better her nutritional status. Will provide handout for patient and family to review.   Per weight records, pt has lost 7 lbs since August 2022 (5% wt loss x 5 months, insignificant for time frame).  Medications: Phenergan  Labs reviewed.  NUTRITION - FOCUSED PHYSICAL EXAM:  Flowsheet Row Most Recent Value  Orbital Region No depletion  Upper Arm Region Severe depletion  Thoracic and Lumbar Region Unable to assess  Buccal Region Moderate  depletion  Temple Region Mild depletion  Clavicle Bone Region Moderate depletion  Clavicle and Acromion Bone Region Moderate depletion  Scapular Bone Region Moderate depletion  Dorsal Hand Severe depletion  Patellar Region Unable to assess  Anterior Thigh Region Unable to assess  Posterior Calf Region Unable to assess  Edema (RD Assessment) None  Hair Reviewed  Eyes Reviewed  Mouth Reviewed  Skin Reviewed       Diet Order:   Diet Order             Diet NPO time specified Except for: Sips with Meds  Diet effective now                   EDUCATION NEEDS:   Education needs have been addressed  Skin:  Skin Assessment: Reviewed RN Assessment  Last BM:  1/22  Height:   Ht Readings from Last 1 Encounters:  09/11/21 5\' 5"  (1.651 m)    Weight:   Wt Readings from Last 1 Encounters:  09/11/21 56.2 kg    BMI:  Body mass index is 20.62 kg/m.  Estimated Nutritional Needs:   Kcal:  1450-1650  Protein:  70-80g  Fluid:  1.6L/day  Clayton Bibles, MS, RD, LDN Inpatient Clinical Dietitian Contact information available via Amion

## 2021-09-11 NOTE — ED Notes (Signed)
Pt transported to [place] at this time

## 2021-09-11 NOTE — ED Notes (Addendum)
Called [facility] to transport patient to [facility].

## 2021-09-11 NOTE — ED Notes (Signed)
Verbal repot given to [place] Paramedic at this time

## 2021-09-11 NOTE — Assessment & Plan Note (Signed)
Cont ACEi and CCB

## 2021-09-11 NOTE — Anesthesia Preprocedure Evaluation (Signed)
Anesthesia Evaluation  Patient identified by MRN, date of birth, ID band Patient awake    Reviewed: Allergy & Precautions, H&P , NPO status , Patient's Chart, lab work & pertinent test results, reviewed documented beta blocker date and time   Airway Mallampati: I  TM Distance: >3 FB Neck ROM: full    Dental no notable dental hx. (+) Teeth Intact, Dental Advisory Given   Pulmonary neg pulmonary ROS,    Pulmonary exam normal breath sounds clear to auscultation       Cardiovascular Exercise Tolerance: Good hypertension, Pt. on medications negative cardio ROS   Rhythm:regular Rate:Normal     Neuro/Psych negative neurological ROS  negative psych ROS   GI/Hepatic negative GI ROS, Neg liver ROS,   Endo/Other  negative endocrine ROS  Renal/GU negative Renal ROS  negative genitourinary   Musculoskeletal   Abdominal   Peds  Hematology negative hematology ROS (+)   Anesthesia Other Findings   Reproductive/Obstetrics negative OB ROS                             Anesthesia Physical Anesthesia Plan  ASA: 4 and emergent  Anesthesia Plan: General   Post-op Pain Management: Toradol IV (intra-op) and Ofirmev IV (intra-op)   Induction: Intravenous  PONV Risk Score and Plan: 3 and Ondansetron and Dexamethasone  Airway Management Planned: Oral ETT and LMA  Additional Equipment: None  Intra-op Plan:   Post-operative Plan:   Informed Consent: I have reviewed the patients History and Physical, chart, labs and discussed the procedure including the risks, benefits and alternatives for the proposed anesthesia with the patient or authorized representative who has indicated his/her understanding and acceptance.     Dental Advisory Given  Plan Discussed with: CRNA and Anesthesiologist  Anesthesia Plan Comments:         Anesthesia Quick Evaluation

## 2021-09-11 NOTE — Assessment & Plan Note (Addendum)
1. Hip fx pathway 2. I am informed by EDP that Dr. Alvan Dame is planning on taking to OR to fix at [facility] tomorrow (1/24), therefore have admitted pt over to [facility] 3. NPO after MN 4. Pain control including IV opiates per pathway. 5. GUPTA score 0.2% 6. My biggest concern will be starting anticoagulation ASAP post op and higher risk of DVT since she waited a week since injury to seek medical attention.

## 2021-09-11 NOTE — Op Note (Signed)
NAME:  Valerie Decker                ACCOUNT NO.: [id number]      MEDICAL RECORD NO.: [id number]      FACILITY:  [facility]      PHYSICIAN:  Mauri Pole  DATE OF BIRTH:  [birth year]     DATE OF PROCEDURE:  09/11/2021                                 OPERATIVE REPORT         PREOPERATIVE DIAGNOSIS: Right  hip femoral neck fracture     POSTOPERATIVE DIAGNOSIS:  Right hip femoral neck fracture     PROCEDURE:  Right total hip replacement through an anterior approach   utilizing DePuy THR system, component size 50 mm pinnacle cup, a size 32+4 neutral   Altrex liner, a size 2 Hi Actis stem with a 32+1 Articuleze metal head ball.      SURGEON:  Pietro Cassis. Alvan Dame, M.D.      ASSISTANT:  Fredderick Phenix, PA-C     ANESTHESIA:  Spinal.      SPECIMENS:  None.      COMPLICATIONS:  None.      BLOOD LOSS:  300 cc     DRAINS:  None.      INDICATION OF THE PROCEDURE:  Valerie Decker is a 88 y.o. female presented to emergency room last night with reports of a fall last week.  Due to her pain and challenges with mobility she needed to assessed.  Radiographic work up revealed a displaced/varus angulated right femoral neck fracture.  The need for surgical management was reviewed with her and her family.  Consent was obtained for   benefit of pain relief.  Specific risks of infection, DVT, component   failure, dislocation, neurovascular injury, and need for revision surgery were reviewed.     PROCEDURE IN DETAIL:  The patient was brought to operative theater.   Once adequate anesthesia, preoperative antibiotics, 2 gm of Acnef, 1 gm of Tranexamic Acid, and 10 mg of Decadron were administered, the patient was positioned supine on the [employer] table.  Once the patient was safely positioned with adequate padding of boney prominences we predraped out the hip, and used fluoroscopy to confirm orientation of the pelvis.      The right hip was then prepped and draped from proximal iliac  crest to   mid thigh with a shower curtain technique.      Time-out was performed identifying the patient, planned procedure, and the appropriate extremity.     An incision was then made 2 cm lateral to the   anterior superior iliac spine extending over the orientation of the   tensor fascia lata muscle and sharp dissection was carried down to the   fascia of the muscle.      The fascia was then incised.  The muscle belly was identified and swept   laterally and retractor placed along the superior neck.  Following   cauterization of the circumflex vessels and removing some pericapsular   fat, a second cobra retractor was placed on the inferior neck.  A T-capsulotomy was made along the line of the   superior neck to the trochanteric fossa, then extended proximally and   distally.  Tag sutures were placed and the retractors were then placed   intracapsular.  We then identified  the trochanteric fossa and   orientation of my neck cut and then made a neck osteotomy with the femur on traction.  The fractured femoral neck and head were removed without difficulty or complication.  Traction was let   off and retractors were placed posterior and anterior around the   acetabulum.      The labrum and foveal tissue were debrided.  I began reaming with a 45 mm   reamer and reamed up to 49 mm reamer with good bony bed preparation and a 50 mm  cup was chosen.  The final 50 mm Pinnacle cup was then impacted under fluoroscopy to confirm the depth of penetration and orientation with respect to   Abduction and forward flexion.  A screw was placed into the ilium followed by the hole eliminator.  The final   32+4 neutral Altrex liner was impacted with good visualized rim fit.  The cup was positioned anatomically within the acetabular portion of the pelvis.      At this point, the femur was rolled to 100 degrees.  Further capsule was   released off the inferior aspect of the femoral neck.  I then   released  the superior capsule proximally.  With the leg in a neutral position the hook was placed laterally   along the femur under the vastus lateralis origin and elevated manually and then held in position using the hook attachment on the bed.  The leg was then extended and adducted with the leg rolled to 100   degrees of external rotation.  Retractors were placed along the medial calcar and posteriorly over the greater trochanter.  Once the proximal femur was fully   exposed, I used a box osteotome to set orientation.  I then began   broaching with the starting chili pepper broach and passed this by hand and then broached up to 2.  With the 2 broach in place I chose a high offset neck and did several trial reductions.  The offset was appropriate, leg lengths   appeared to be equal best matched with the +1 head ball trial confirmed radiographically.   Given these findings, I went ahead and dislocated the hip, repositioned all   retractors and positioned the right hip in the extended and abducted position.  The final 2 Hi Actis stem was   chosen and it was impacted down to the level of neck cut.  Based on this   and the trial reductions, a final 32+1 Articuleze metal head ball was chosen and   impacted onto a clean and dry trunnion, and the hip was reduced.  The   hip had been irrigated throughout the case again at this point.  I did   reapproximate the superior capsular leaflet to the anterior leaflet   using #1 Vicryl.  The fascia of the   tensor fascia lata muscle was then reapproximated using #1 Vicryl and #0 Stratafix sutures.  The   remaining wound was closed with 2-0 Vicryl and running 4-0 Monocryl.   The hip was cleaned, dried, and dressed sterilely using Dermabond and   Aquacel dressing.  The patient was then brought   to recovery room in stable condition tolerating the procedure well.    Costella Hatcher, PA-C was present for the entirety of the case involved from   preoperative positioning,  perioperative retractor management, general   facilitation of the case, as well as primary wound closure as assistant.  Pietro Cassis Alvan Dame, M.D.        09/11/2021 4:29 PM

## 2021-09-11 NOTE — Progress Notes (Signed)
PROGRESS NOTE    Valerie Decker  BOF:[id number] DOB: March 24, 1931 DOA: 09/10/2021 PCP: Maurice Small, MD    Brief Narrative:  Valerie Decker is a 88 year old female with past medical history significant for essential hypertension, hyperlipidemia, hypothyroidism, depression, GERD who presented to [facility] ED on 1/23 with right hip pain.  Patient reports fall 1 week ago while bringing out her garbage cans.  She was able to be assisted back to the home by the garbage man but subsequently had progressive pain with inability to ambulate despite trying to utilize over-the-counter medications.  Patient denied any syncope or loss of consciousness during event, no head trauma.  No other complaints or concerns at this time.  In the ED, temperature 98.6 F, HR 96, RR 20, BP 167/121, SPO2 95% on room air.  Sodium 135, potassium 4.3, chloride 99, BUN 23, creatinine 0.70, glucose 120.  WBC 8.9, hemoglobin 14.1, platelets 282.  Chest x-ray with no active cardiopulmonary disease process.  Right hip x-ray with no fracture or dislocation, moderate right hip degenerative changes.  CT pelvis without contrast with nondisplaced, impacted right femoral neck fracture.  Orthopedics was consulted.  Patient was transferred to [facility] for planned surgical intervention.  Hospitalist service consulted for further evaluation management.   Assessment & Plan:   Principal Problem:   Closed right hip fracture, initial encounter ([place]) Active Problems:   Hypothyroidism   HYPERLIPIDEMIA   HTN (hypertension)   Asthma   Closed right hip fracture, initial encounter Patient presenting to the ED following mechanical fall 1 week ago with subsequent progressive right hip pain and inability to ambulate.  CT pelvis without contrast with nondisplaced impacted right femoral neck fracture. --Orthopedics, Dr. Alvan Dame consulted and following, appreciate assistance --Pending right anterior total hip arthroplasty today; n.p.o. --Morphine 0.5  mg IV every 2 hours as needed severe pain --Postoperative DP [place] per orthopedics --PT/OT evaluation postoperatively  Hypothyroidism --Levothyroxine 25 mcg p.o. daily  Depression/anxiety: --Celexa 20 mg p.o. daily. --Venlafaxine Exar 75 mg p.o. daily  Hyperlipidemia --Simvastatin 10 mg p.o. daily  Essential hypertension --Cardizem CD 220 mg p.o. daily --Lisinopril 40 mg p.o. daily --Labetalol 5 mg IV q4h PRN SBP >170 or DBP >110; hold for GR<60  Asthma --Dulera 200-5 mcg 2 puffs twice daily --Montelukast 10 mg p.o. daily --Albuterol neb twice daily as needed wheezing/shortness of breath  GERD: Protonix 80 mg p.o. daily    DVT prophylaxis: SCDs Start: 09/10/21 2238   Code Status: Full Code Family Communication: Updated spouse and daughter present at bedside this morning  Disposition Plan:  Level of care: Med-Surg Status is: Inpatient  Remains inpatient appropriate because: Pending operative management for right hip fracture at a later this afternoon, pending PT/OT evaluation likely home versus SNF in 1-2 days postoperatively.   Consultants:  [facility], Dr. Alvan Dame  Procedures:  Right anterior total hip arthroplasty: Pending  Antimicrobials:  None   Subjective: Patient seen examined at bedside, resting comfortably.  No complaints this morning.  Pain controlled.  Husband and daughter present at bedside.  Awaiting for operative management later this afternoon.  Appreciative of all the care from the staff so far during hospitalization.  Husband hoping that their son will be able to see her before going to the operating room this afternoon as he is working right now.  No other questions or concerns at this time.  Patient denies headache, no dizziness, no visual changes, no fever/chills/night sweats, no nausea/vomiting/diarrhea, no chest pain, palpitations, no shortness of breath, no abdominal  pain, no weakness, no fatigue, no paresthesias.  No acute concerns overnight per  nurse staff.  Objective: Vitals:   09/11/21 0507 09/11/21 0804 09/11/21 0930 09/11/21 1000  BP: 136/83  (!) 170/85   Pulse: 83  93   Resp: 18  18   Temp: (!) 97.5 F (36.4 C)  97.7 F (36.5 C)   TempSrc: Oral  Oral   SpO2: 95% 97% 93% 95%  Weight:      Height:        Intake/Output Summary (Last 24 hours) at 09/11/2021 1259 Last data filed at 09/11/2021 9741 Gross per 24 hour  Intake 50 ml  Output 250 ml  Net -200 ml   Filed Weights   09/10/21 1932 09/11/21 0055  Weight: 58.5 kg 56.2 kg    Examination:  General exam: Appears calm and comfortable, elderly in appearance Respiratory system: Clear to auscultation. Respiratory effort normal.  On room air Cardiovascular system: S1 & S2 heard, RRR. No JVD, murmurs, rubs, gallops or clicks. No pedal edema. Gastrointestinal system: Abdomen is nondistended, soft and nontender. No organomegaly or masses felt. Normal bowel sounds heard. Central nervous system: Alert and oriented. No focal neurological deficits. Extremities: Symmetric 5 x 5 power.  Neurovascular intact Skin: No rashes, lesions or ulcers Psychiatry: Judgement and insight appear normal. Mood & affect appropriate.     Data Reviewed: I have personally reviewed following labs and imaging studies  CBC: Recent Labs  Lab 09/10/21 1946 09/10/21 2223  WBC  --  8.9  NEUTROABS  --  6.3  HGB 15.6* 14.1  HCT 46.0 41.3  MCV  --  95.8  PLT  --  638   Basic Metabolic Panel: Recent Labs  Lab 09/10/21 1946 09/10/21 2223  NA 135 133*  K 4.3 4.2  CL 99 100  CO2  --  21*  GLUCOSE 120* 86  BUN 23 16  CREATININE 0.70 0.77  CALCIUM  --  8.6*   GFR: Estimated Creatinine Clearance: 43.1 mL/min (by C-G formula based on SCr of 0.77 mg/dL). Liver Function Tests: No results for input(s): AST, ALT, ALKPHOS, BILITOT, PROT, ALBUMIN in the last 168 hours. No results for input(s): LIPASE, AMYLASE in the last 168 hours. No results for input(s): AMMONIA in the last 168  hours. Coagulation Profile: No results for input(s): INR, PROTIME in the last 168 hours. Cardiac Enzymes: No results for input(s): CKTOTAL, CKMB, CKMBINDEX, TROPONINI in the last 168 hours. BNP (last 3 results) No results for input(s): PROBNP in the last 8760 hours. HbA1C: No results for input(s): HGBA1C in the last 72 hours. CBG: No results for input(s): GLUCAP in the last 168 hours. Lipid Profile: No results for input(s): CHOL, HDL, LDLCALC, TRIG, CHOLHDL, LDLDIRECT in the last 72 hours. Thyroid Function Tests: Recent Labs    09/11/21 0911  TSH 1.791   Anemia Panel: No results for input(s): VITAMINB12, FOLATE, FERRITIN, TIBC, IRON, RETICCTPCT in the last 72 hours. Sepsis Labs: No results for input(s): PROCALCITON, LATICACIDVEN in the last 168 hours.  Recent Results (from the past 240 hour(s))  Resp Panel by RT-PCR (Flu A&B, Covid) Nasopharyngeal Swab     Status: None   Collection Time: 09/10/21 10:27 PM   Specimen: Nasopharyngeal Swab; Nasopharyngeal(NP) swabs in vial transport medium  Result Value Ref Range Status   SARS Coronavirus 2 by RT PCR NEGATIVE NEGATIVE Final    Comment: (NOTE) SARS-CoV-2 target nucleic acids are NOT DETECTED.  The SARS-CoV-2 RNA is generally detectable in upper respiratory specimens  during the acute phase of infection. The lowest concentration of SARS-CoV-2 viral copies this assay can detect is 138 copies/mL. A negative result does not preclude SARS-Cov-2 infection and should not be used as the sole basis for treatment or other patient management decisions. A negative result may occur with  improper specimen collection/handling, submission of specimen other than nasopharyngeal swab, presence of viral mutation(s) within the areas targeted by this assay, and inadequate number of viral copies(<138 copies/mL). A negative result must be combined with clinical observations, patient history, and epidemiological information. The expected result is  Negative.  Fact Sheet for Patients:  EntrepreneurPulse.com.au  Fact Sheet for Healthcare Providers:  IncredibleEmployment.be  This test is no t yet approved or cleared by the [place] FDA and  has been authorized for detection and/or diagnosis of SARS-CoV-2 by FDA under an Emergency Use Authorization (EUA). This EUA will remain  in effect (meaning this test can be used) for the duration of the COVID-19 declaration under Section 564(b)(1) of the Act, 21 U.S.C.section 360bbb-3(b)(1), unless the authorization is terminated  or revoked sooner.       Influenza A by PCR NEGATIVE NEGATIVE Final   Influenza B by PCR NEGATIVE NEGATIVE Final    Comment: (NOTE) The Xpert Xpress SARS-CoV-2/FLU/RSV plus assay is intended as an aid in the diagnosis of influenza from Nasopharyngeal swab specimens and should not be used as a sole basis for treatment. Nasal washings and aspirates are unacceptable for Xpert Xpress SARS-CoV-2/FLU/RSV testing.  Fact Sheet for Patients: EntrepreneurPulse.com.au  Fact Sheet for Healthcare Providers: IncredibleEmployment.be  This test is not yet approved or cleared by the [place] FDA and has been authorized for detection and/or diagnosis of SARS-CoV-2 by FDA under an Emergency Use Authorization (EUA). This EUA will remain in effect (meaning this test can be used) for the duration of the COVID-19 declaration under Section 564(b)(1) of the Act, 21 U.S.C. section 360bbb-3(b)(1), unless the authorization is terminated or revoked.  Performed at [facility], [place] [address] [place], [place] [address]   Surgical PCR screen     Status: None   Collection Time: 09/11/21  1:20 AM   Specimen: Nasal Mucosa; Nasal Swab  Result Value Ref Range Status   MRSA, PCR NEGATIVE NEGATIVE Final   Staphylococcus aureus NEGATIVE NEGATIVE Final    Comment: (NOTE) The Xpert SA Assay (FDA approved  for NASAL specimens in patients 23 years of age and older), is one component of a comprehensive surveillance program. It is not intended to diagnose infection nor to guide or monitor treatment. Performed at [facility], [place] [address], [place], [place] [address]          Radiology Studies: CT PELVIS WO CONTRAST  Result Date: 09/10/2021 CLINICAL DATA:  Pelvic trauma. EXAM: CT PELVIS WITHOUT CONTRAST TECHNIQUE: Multidetector CT imaging of the pelvis was performed following the standard protocol without intravenous contrast. RADIATION DOSE REDUCTION: This exam was performed according to the departmental dose-optimization program which includes automated exposure control, adjustment of the mA and/or kV according to patient size and/or use of iterative reconstruction technique. COMPARISON:  Right hip radiograph dated 09/10/2021. FINDINGS: Evaluation of this exam is limited in the absence of intravenous contrast. Urinary Tract:  No abnormality visualized. Bowel:  Unremarkable visualized pelvic bowel loops. Vascular/Lymphatic: Moderate atherosclerotic calcification. The visualized vasculature is otherwise unremarkable. No adenopathy within the pelvis. Reproductive:  The uterus and ovaries are grossly unremarkable. Other:  None Musculoskeletal: Nondisplaced, impacted right femoral neck fracture. No  dislocation. The bones are osteopenic. Degenerative changes of the lower lumbar spine. IMPRESSION: Nondisplaced, impacted right femoral neck fracture. Electronically Signed   By: Anner Crete M.D.   On: 09/10/2021 21:55   Chest Portable 1 View  Result Date: 09/10/2021 CLINICAL DATA:  Preop EXAM: PORTABLE CHEST 1 VIEW COMPARISON:  08/15/2020 FINDINGS: Heart and mediastinal contours are within normal limits. No focal opacities or effusions. No acute bony abnormality. IMPRESSION: No active disease. Electronically Signed   By: Rolm Baptise M.D.   On: 09/10/2021 23:19   DG Hip Unilat W or  Wo Pelvis 2-3 Views Right  Result Date: 09/10/2021 CLINICAL DATA:  Right hip pain since a fall 7 days ago. EXAM: DG HIP (WITH OR WITHOUT PELVIS) 2-3V RIGHT COMPARISON:  None. FINDINGS: Moderate to marked femoral head and neck junction spur formation with mild joint space narrowing. No fracture or dislocation seen. Lower lumbar spine degenerative changes. Atheromatous arterial calcifications. Mildly prominent stool in the colon. IMPRESSION: 1. No fracture or dislocation. 2. Moderate right hip degenerative changes. 3. Lower lumbar spine degenerative changes. Electronically Signed   By: Claudie Revering M.D.   On: 09/10/2021 12:30        Scheduled Meds:  citalopram  20 mg Oral Daily   diltiazem  120 mg Oral Daily   fluticasone  2 spray Each Nare Daily   levothyroxine  25 mcg Oral Q0600   lisinopril  40 mg Oral Daily   loratadine  10 mg Oral Daily   melatonin  5 mg Oral QHS   mometasone-formoterol  2 puff Inhalation BID   montelukast  10 mg Oral QHS   [START ON 09/12/2021] multivitamin with minerals  1 tablet Oral Daily   pantoprazole  80 mg Oral Daily   simvastatin  10 mg Oral QHS   venlafaxine XR  75 mg Oral Q breakfast   Continuous Infusions:  sodium chloride 75 mL/hr at 09/11/21 1030   promethazine (PHENERGAN) injection (IM or IVPB) Stopped (09/11/21 0050)     LOS: 1 day    Time spent: 46 minutes spent on chart review, discussion with nursing staff, consultants, updating family and interview/physical exam; more than 50% of that time was spent in counseling and/or coordination of care.    Cassadee Vanzandt J [place] DO [facility] Available via Epic secure chat 7am-7pm After these hours, please refer to coverage provider listed on [email or web address] 09/11/2021, 12:59 PM

## 2021-09-11 NOTE — Assessment & Plan Note (Addendum)
Continue synthroid Checking TSH with next lab draw since last one I can see is from 2013

## 2021-09-11 NOTE — Final Consult Note (Signed)
ORTHOPAEDIC CONSULTATION  REQUESTING PHYSICIAN: [place] Donnamarie Poag, DO  PCP:  Maurice Small, MD  Chief Complaint: Right hip pain after fall at home  HPI: Valerie Decker is a 88 y.o. female who presented to [facility] ED after a fall 1 week prior which resulted in significant hip pain. She reports she had not been able to ambulate since the fall. Imaging in the ED revealed nondisplaced femoral neck fracture and orthopaedics was consulted for evaluation and management.   She was transferred to [facility] for surgical treatment.   Today, she is resting in bed with her husband at the bedside, and later her daughter as well. She is comfortable at rest. Her husband states they live at home alone, and she ambulates without assistive devices at baseline. She has minimal medical history, and is not on blood thinners.   Past Medical History:  Diagnosis Date   Allergic rhinitis, cause unspecified    Asthma    Carpal tunnel syndrome    Diverticulosis    Elevated serum homocysteine level    Esophageal reflux    H. pylori infection 2010   Irritable bowel syndrome    Irritable bowel syndrome with diarrhea    Mixed hyperlipidemia    Neuralgia, neuritis, and radiculitis, unspecified    Osteopenia    Unspecified asthma(493.90)    Unspecified essential hypertension    Unspecified hypothyroidism    Unspecified sleep apnea    marginal; intolerant to CPAP   Past Surgical History:  Procedure Laterality Date   BREAST BIOPSY     COLONOSCOPY     Tics   DILATION AND CURETTAGE OF UTERUS     facial plastic surgery     TONSILLECTOMY AND ADENOIDECTOMY     ? uvulectomy   UVULECTOMY     Social History   Socioeconomic History   Marital status: Married    Spouse name: Not on file   Number of children: 2   Years of education: Not on file   Highest education level: Not on file  Occupational History   Not on file  Tobacco Use   Smoking status: Never    Passive exposure: Never   Smokeless tobacco: Never    Tobacco comments:    Second hand smoke from mother & husband for decades  Vaping Use   Vaping Use: Never used  Substance and Sexual Activity   Alcohol use: No    Alcohol/week: 0.0 standard drinks    Comment: Occasionally has a glass of red wine with dinner   Drug use: No   Sexual activity: Not Currently  Other Topics Concern   Not on file  Social History Narrative   Patient is married and lives at home with her husband. She has two children. She is retired. She reports she completed a 4 year degree.    Social Determinants of Health   Financial Resource Strain: Not on file  Food Insecurity: Not on file  Transportation Needs: Not on file  Physical Activity: Not on file  Stress: Not on file  Social Connections: Not on file   Family History  Problem Relation Age of Onset   Heart failure Mother        liver disease   Liver disease Mother 39   Stroke Maternal Aunt    COPD Maternal Aunt    Lung cancer Maternal Aunt    Asthma Maternal Grandfather    Colon cancer Neg Hx    Allergies  Allergen Reactions   Penicillins Diarrhea and Nausea  And Vomiting   Tape Rash    Red skin after tape removed after surgery   Prior to Admission medications   Medication Sig Start Date End Date Taking? Authorizing Provider  acetaminophen (TYLENOL) 500 MG tablet Take 1,000 mg by mouth every 6 (six) hours as needed for moderate pain or headache.   Yes [provider]  albuterol (ACCUNEB) 0.63 MG/3ML nebulizer solution Take 1 ampule by nebulization as needed for wheezing.   Yes [provider]  albuterol (VENTOLIN HFA) 108 (90 Base) MCG/ACT inhaler Inhale 1 puff into the lungs 2 (two) times daily as needed for wheezing or shortness of breath.   Yes [provider]  budesonide-formoterol (SYMBICORT) 160-4.5 MCG/ACT inhaler Inhale 1-2 puffs into the lungs 2 (two) times daily.   Yes [provider]  CALCIUM CITRATE-VITAMIN D PO Take 2 tablets by mouth daily. Calcium  citrate 400 iu calcium/630mg  vitamin d   Yes [provider]  Cholecalciferol (VITAMIN D-3) 125 MCG (5000 UT) TABS Take 5,000 Units by mouth daily.   Yes [provider]  citalopram (CELEXA) 20 MG tablet Take 20 mg by mouth daily. 05/19/14  Yes [provider]  Coenzyme Q10 (CO Q 10) 60 MG CAPS Take 60 mg by mouth daily.   Yes [provider]  diltiazem (CARDIZEM CD) 120 MG 24 hr capsule 120 mg daily. 03/23/20  Yes [provider]  fluticasone (FLONASE) 50 MCG/ACT nasal spray Place 2 sprays into the nose daily.   Yes [provider]  ibuprofen (ADVIL) 200 MG tablet Take 200 mg by mouth every 6 (six) hours as needed for headache or moderate pain.   Yes [provider]  levocetirizine (XYZAL) 5 MG tablet Take 5 mg by mouth every evening.   Yes [provider]  levothyroxine (SYNTHROID, LEVOTHROID) 25 MCG tablet TAKE 1 TABLET ONCE DAILY. Patient taking differently: Take 25 mcg by mouth daily before breakfast. 09/17/13  Yes Hendricks Limes, MD  lisinopril (ZESTRIL) 40 MG tablet Take 40 mg by mouth daily. 04/25/20  Yes [provider]  Melatonin 5 MG CHEW Chew 5 mg by mouth at bedtime.   Yes [provider]  Methylcellulose, Laxative, (CITRUCEL PO) Take 1 Scoop by mouth daily.   Yes [provider]  montelukast (SINGULAIR) 10 MG tablet Take 10 mg by mouth at bedtime.   Yes [provider]  omeprazole (PRILOSEC) 40 MG capsule Take 40 mg by mouth daily before breakfast.   Yes [provider]  simvastatin (ZOCOR) 10 MG tablet TAKE ONE TABLET AT BEDTIME. Patient taking differently: Take 10 mg by mouth at bedtime. 09/21/13  Yes Hendricks Limes, MD  Turmeric (QC TUMERIC COMPLEX PO) Take 750 mg by mouth daily.   Yes [provider]  venlafaxine XR (EFFEXOR-XR) 75 MG 24 hr capsule Take 75 mg by mouth daily with breakfast.   Yes [provider]   CT PELVIS WO CONTRAST  Result  Date: 09/10/2021 CLINICAL DATA:  Pelvic trauma. EXAM: CT PELVIS WITHOUT CONTRAST TECHNIQUE: Multidetector CT imaging of the pelvis was performed following the standard protocol without intravenous contrast. RADIATION DOSE REDUCTION: This exam was performed according to the departmental dose-optimization program which includes automated exposure control, adjustment of the mA and/or kV according to patient size and/or use of iterative reconstruction technique. COMPARISON:  Right hip radiograph dated 09/10/2021. FINDINGS: Evaluation of this exam is limited in the absence of intravenous contrast. Urinary Tract:  No abnormality visualized. Bowel:  Unremarkable visualized  pelvic bowel loops. Vascular/Lymphatic: Moderate atherosclerotic calcification. The visualized vasculature is otherwise unremarkable. No adenopathy within the pelvis. Reproductive:  The uterus and ovaries are grossly unremarkable. Other:  None Musculoskeletal: Nondisplaced, impacted right femoral neck fracture. No dislocation. The bones are osteopenic. Degenerative changes of the lower lumbar spine. IMPRESSION: Nondisplaced, impacted right femoral neck fracture. Electronically Signed   By: Anner Crete M.D.   On: 09/10/2021 21:55   Chest Portable 1 View  Result Date: 09/10/2021 CLINICAL DATA:  Preop EXAM: PORTABLE CHEST 1 VIEW COMPARISON:  08/15/2020 FINDINGS: Heart and mediastinal contours are within normal limits. No focal opacities or effusions. No acute bony abnormality. IMPRESSION: No active disease. Electronically Signed   By: Rolm Baptise M.D.   On: 09/10/2021 23:19   DG Hip Unilat W or Wo Pelvis 2-3 Views Right  Result Date: 09/10/2021 CLINICAL DATA:  Right hip pain since a fall 7 days ago. EXAM: DG HIP (WITH OR WITHOUT PELVIS) 2-3V RIGHT COMPARISON:  None. FINDINGS: Moderate to marked femoral head and neck junction spur formation with mild joint space narrowing. No fracture or dislocation seen. Lower lumbar spine degenerative  changes. Atheromatous arterial calcifications. Mildly prominent stool in the colon. IMPRESSION: 1. No fracture or dislocation. 2. Moderate right hip degenerative changes. 3. Lower lumbar spine degenerative changes. Electronically Signed   By: Claudie Revering M.D.   On: 09/10/2021 12:30    Positive ROS: All other systems have been reviewed and were otherwise negative with the exception of those mentioned in the HPI and as above.  Physical Exam: General: Alert, no acute distress Cardiovascular: No pedal edema Respiratory: No cyanosis, no use of accessory musculature Skin: No lesions in the area of chief complaint Neurologic: Sensation intact distally Psychiatric: Patient is competent for consent with normal mood and affect  MUSCULOSKELETAL:   RLE:  Motion not assessed due to nature of injury. No skin lesions or ecchymosis. Sensation intact distally. Able to move foot and toes without difficulty. Pulses intact.   Assessment: Nondisplaced right femoral neck fracture  Plan: I discussed with the patient and her family the diagnosis of right femoral neck fracture and planned treatment with right total hip arthroplasty. We discussed expectations after surgery. She will likely be in the hospital for at least 1-2 days to work on PT and determine disposition. Planned tentative disposition will be back home with family assistance.     Irving Copas, PA-C Cell [phone]   09/11/2021 7:34 AM

## 2021-09-11 NOTE — H&P (Signed)
History and Physical    Patient: Valerie Decker KDX:[id number] DOB: Dec 21, 1930 DOA: 09/10/2021 DOS: the patient was seen and examined on 09/11/2021 PCP: Maurice Small, MD  Patient coming from: Home  Chief Complaint: R hip pain  HPI: Valerie Decker is a 88 y.o. female with medical history significant of HTN, HLD.  Pt had a fall ~1 week ago.  Non-ambulatory since that time with R hip pain.  Unclear why she waited 1 week to come in to ED, but despite rest and OTC meds her hip pain hasnt improved so she presents to the ED today.  Review of Systems: As mentioned in the history of present illness. All other systems reviewed and are negative. Past Medical History:  Diagnosis Date   Allergic rhinitis, cause unspecified    Asthma    Carpal tunnel syndrome    Diverticulosis    Elevated serum homocysteine level    Esophageal reflux    H. pylori infection 2010   Irritable bowel syndrome    Irritable bowel syndrome with diarrhea    Mixed hyperlipidemia    Neuralgia, neuritis, and radiculitis, unspecified    Osteopenia    Unspecified asthma(493.90)    Unspecified essential hypertension    Unspecified hypothyroidism    Unspecified sleep apnea    marginal; intolerant to CPAP   Past Surgical History:  Procedure Laterality Date   BREAST BIOPSY     COLONOSCOPY     Tics   DILATION AND CURETTAGE OF UTERUS     facial plastic surgery     TONSILLECTOMY AND ADENOIDECTOMY     ? uvulectomy   UVULECTOMY     Social History:  reports that she has never smoked. She has never been exposed to tobacco smoke. She has never used smokeless tobacco. She reports that she does not drink alcohol and does not use drugs.  Allergies  Allergen Reactions   Penicillins Diarrhea and Nausea And Vomiting   Tape Rash    Red skin after tape removed after surgery    Family History  Problem Relation Age of Onset   Heart failure Mother        liver disease   Liver disease Mother 38   Stroke Maternal Aunt    COPD  Maternal Aunt    Lung cancer Maternal Aunt    Asthma Maternal Grandfather    Colon cancer Neg Hx     Prior to Admission medications   Medication Sig Start Date End Date Taking? Authorizing Provider  acetaminophen (TYLENOL) 500 MG tablet Take 1,000 mg by mouth every 6 (six) hours as needed for moderate pain or headache.   Yes [provider]  albuterol (ACCUNEB) 0.63 MG/3ML nebulizer solution Take 1 ampule by nebulization as needed for wheezing.   Yes [provider]  albuterol (VENTOLIN HFA) 108 (90 Base) MCG/ACT inhaler Inhale 1 puff into the lungs 2 (two) times daily as needed for wheezing or shortness of breath.   Yes [provider]  budesonide-formoterol (SYMBICORT) 160-4.5 MCG/ACT inhaler Inhale 1-2 puffs into the lungs 2 (two) times daily.   Yes [provider]  CALCIUM CITRATE-VITAMIN D PO Take 2 tablets by mouth daily. Calcium citrate 400 iu calcium/630mg  vitamin d   Yes [provider]  Cholecalciferol (VITAMIN D-3) 125 MCG (5000 UT) TABS Take 5,000 Units by mouth daily.   Yes [provider]  citalopram (CELEXA) 20 MG tablet Take 20 mg by mouth daily. 05/19/14  Yes [provider]  Coenzyme Q10 (CO Q 10)  60 MG CAPS Take 60 mg by mouth daily.   Yes [provider]  diltiazem (CARDIZEM CD) 120 MG 24 hr capsule 120 mg daily. 03/23/20  Yes [provider]  fluticasone (FLONASE) 50 MCG/ACT nasal spray Place 2 sprays into the nose daily.   Yes [provider]  ibuprofen (ADVIL) 200 MG tablet Take 200 mg by mouth every 6 (six) hours as needed for headache or moderate pain.   Yes [provider]  levocetirizine (XYZAL) 5 MG tablet Take 5 mg by mouth every evening.   Yes [provider]  levothyroxine (SYNTHROID, LEVOTHROID) 25 MCG tablet TAKE 1 TABLET ONCE DAILY. Patient taking differently: Take 25 mcg by mouth daily before breakfast. 09/17/13  Yes Hendricks Limes, MD  lisinopril  (ZESTRIL) 40 MG tablet Take 40 mg by mouth daily. 04/25/20  Yes [provider]  Melatonin 5 MG CHEW Chew 5 mg by mouth at bedtime.   Yes [provider]  Methylcellulose, Laxative, (CITRUCEL PO) Take 1 Scoop by mouth daily.   Yes [provider]  montelukast (SINGULAIR) 10 MG tablet Take 10 mg by mouth at bedtime.   Yes [provider]  omeprazole (PRILOSEC) 40 MG capsule Take 40 mg by mouth daily before breakfast.   Yes [provider]  simvastatin (ZOCOR) 10 MG tablet TAKE ONE TABLET AT BEDTIME. Patient taking differently: Take 10 mg by mouth at bedtime. 09/21/13  Yes Hendricks Limes, MD  Turmeric (QC TUMERIC COMPLEX PO) Take 750 mg by mouth daily.   Yes [provider]  venlafaxine XR (EFFEXOR-XR) 75 MG 24 hr capsule Take 75 mg by mouth daily with breakfast.   Yes [provider]    Physical Exam: Vitals:   09/10/21 1731 09/10/21 1932 09/10/21 2008 09/11/21 0022  BP: (!) 151/80  (!) 179/84 (!) 178/70  Pulse: 95  85 93  Resp: 17  18 15   Temp:   98.6 F (37 C) 98 F (36.7 C)  TempSrc:   Oral Oral  SpO2: 94%  96% 94%  Weight:  58.5 kg    Height:  5\' 5"  (1.651 m)     Constitutional: NAD, calm, comfortable Eyes: PERRL, lids and conjunctivae normal ENMT: Mucous membranes are moist. Posterior pharynx clear of any exudate or lesions.Normal dentition.  Neck: normal, supple, no masses, no thyromegaly Respiratory: clear to auscultation bilaterally, no wheezing, no crackles. Normal respiratory effort. No accessory muscle use.  Cardiovascular: Regular rate and rhythm, no murmurs / rubs / gallops. No extremity edema. 2+ pedal pulses. No carotid bruits.  Abdomen: no tenderness, no masses palpated. No hepatosplenomegaly. Bowel sounds positive.  Musculoskeletal: TTP R hip, R knee and ankle unremarkable.  Pain with ROM of R hip. Skin: no rashes, lesions, ulcers. No induration Neurologic: CN 2-12 grossly intact. Sensation intact, DTR  normal. Strength 5/5 in all 4.  Psychiatric: Normal judgment and insight. Alert and oriented x 3. Normal mood.    Data Reviewed:  R femoral neck fx  Assessment/Plan * Closed right hip fracture, initial encounter [facility])- (present on admission) Hip fx pathway I am informed by EDP that Dr. Alvan Dame is planning on taking to OR to fix at [facility] tomorrow (1/24), therefore have admitted pt over to [facility] NPO after MN Pain control including IV opiates per pathway. GUPTA score 0.2% My biggest concern will be starting anticoagulation ASAP post op and higher risk of DVT since she waited a week since injury to seek medical attention.  HTN (hypertension)- (present  on admission) Cont ACEi and CCB  HYPERLIPIDEMIA- (present on admission) Cont Statin  Hypothyroidism- (present on admission) Continue synthroid Checking TSH with next lab draw since last one I can see is from 2013    Advance Care Planning:   Code Status: Full Code  Consults: Dr. Alvan Dame  Family Communication: Husband at bedside  Severity of Illness: The appropriate patient status for this patient is INPATIENT. Inpatient status is judged to be reasonable and necessary in order to provide the required intensity of service to ensure the patient's safety. The patient's presenting symptoms, physical exam findings, and initial radiographic and laboratory data in the context of their chronic comorbidities is felt to place them at high risk for further clinical deterioration. Furthermore, it is not anticipated that the patient will be medically stable for discharge from the hospital within 2 midnights of admission.   * I certify that at the point of admission it is my clinical judgment that the patient will require inpatient hospital care spanning beyond 2 midnights from the point of admission due to high intensity of service, high risk for further deterioration and high frequency of surveillance required.*  Author: Etta Quill., DO 09/11/2021 12:34  AM  For on call review [email or web address].

## 2021-09-12 ENCOUNTER — Encounter (HOSPITAL_COMMUNITY): Payer: Self-pay | Admitting: Orthopedic Surgery

## 2021-09-12 DIAGNOSIS — Z96642 Presence of left artificial hip joint: Secondary | ICD-10-CM

## 2021-09-12 DIAGNOSIS — E440 Moderate protein-calorie malnutrition: Secondary | ICD-10-CM

## 2021-09-12 DIAGNOSIS — Z01811 Encounter for preprocedural respiratory examination: Secondary | ICD-10-CM

## 2021-09-12 LAB — CBC
HCT: 37.3 % (ref 36.0–46.0)
Hemoglobin: 12.3 g/dL (ref 12.0–15.0)
MCH: 31.9 pg (ref 26.0–34.0)
MCHC: 33 g/dL (ref 30.0–36.0)
MCV: 96.6 fL (ref 80.0–100.0)
Platelets: 256 10*3/uL (ref 150–400)
RBC: 3.86 MIL/uL — ABNORMAL LOW (ref 3.87–5.11)
RDW: 13.3 % (ref 11.5–15.5)
WBC: 12 10*3/uL — ABNORMAL HIGH (ref 4.0–10.5)
nRBC: 0 % (ref 0.0–0.2)

## 2021-09-12 LAB — BASIC METABOLIC PANEL
Anion gap: 11 (ref 5–15)
BUN: 18 mg/dL (ref 8–23)
CO2: 20 mmol/L — ABNORMAL LOW (ref 22–32)
Calcium: 8.1 mg/dL — ABNORMAL LOW (ref 8.9–10.3)
Chloride: 102 mmol/L (ref 98–111)
Creatinine, Ser: 0.78 mg/dL (ref 0.44–1.00)
GFR, Estimated: >60 mL/min (ref >60)
Glucose, Bld: 172 mg/dL — ABNORMAL HIGH (ref 70–99)
Potassium: 4.2 mmol/L (ref 3.5–5.1)
Sodium: 133 mmol/L — ABNORMAL LOW (ref 135–145)

## 2021-09-12 MED ORDER — POLYETHYLENE GLYCOL 3350 17 G PO PACK
17.0000 g | PACK | Freq: Two times a day (BID) | ORAL | Status: AC
  Administered 2021-09-12 – 2021-09-13 (×4): 17 g via ORAL
  Filled 2021-09-12 (×4): qty 1

## 2021-09-12 NOTE — Progress Notes (Signed)
Occupational Therapy Evaluation  Patient lives at home with spouse and is independent at baseline with self care and ambulation. Currently patient has post op soreness needing up to mod A for lower body self care and light min A for sit to stand with verbal cues for technique not to pull on walker. Patient does endorse some dizziness and nausea with ambulation however vitals stable once taken in recliner. Patient would benefit from further acute OT services for education on compensatory strategies, education on AE/DME in order to facilitate D/C to venue listed below.     09/12/21 1400  OT Visit Information  Last OT Received On 09/12/21  Assistance Needed +1  PT/OT/SLP Co-Evaluation/Treatment Yes  Reason for Co-Treatment To address functional/ADL transfers;For patient/therapist safety  PT goals addressed during session Mobility/safety with mobility  OT goals addressed during session ADL's and self-care  History of Present Illness Valerie Decker is a 88 year old female who presented to ED on 1/23 with right hip pain.  Patient reports fall 1 week PTA. Sustained right femoral neck fracture. A/P right THA through direct anterior approach on 09/11/21. PMH: essential hypertension, hyperlipidemia, hypothyroidism, depression, GERD  Precautions  Precautions Fall  Restrictions  Weight Bearing Restrictions No  RLE Weight Bearing WBAT  Home Living  Family/patient expects to be discharged to: Private residence  Living Arrangements Spouse/significant other  Available Help at Discharge Family  Type of [place] to enter  Entrance Stairs-Number of Steps 1  [place] Two level;Able to live on main level with bedroom/bathroom  Barrister's clerk Grab bars - tub/shower;Shower seat;Rolling Environmental consultant (2 wheels)  Additional Comments lives with spouse who uses a cane. A daughter is coming to stay on 1/30.  Prior Function  Prior  Level of Function  Independent/Modified Independent  Mobility Comments does not drive  ADLs Comments independent  Communication  Communication No difficulties  Pain Assessment  Pain Assessment 0-10  Pain Score 8  Pain Location right hip  Pain Descriptors / Indicators Sore  Pain Intervention(s) Monitored during session  Cognition  Arousal/Alertness Awake/alert  Behavior During Therapy WFL for tasks assessed/performed  Overall Cognitive Status Within Functional Limits for tasks assessed  General Comments Grossly oriented, tangential.  Upper Extremity Assessment  Upper Extremity Assessment Overall WFL for tasks assessed  Lower Extremity Assessment  Lower Extremity Assessment Defer to PT evaluation  ADL  Overall ADL's  Needs assistance/impaired  Grooming Wash/dry hands;Supervision/safety;Standing  Upper Body Bathing Set up;Sitting  Lower Body Bathing Minimal assistance;Sit to/from stand;Sitting/lateral leans  Upper Body Dressing  Set up;Sitting  Lower Body Dressing Sitting/lateral leans;Sit to/from stand;Moderate assistance  Lower Body Dressing Details (indicate cue type and reason) Patient needing assist to doff R sock and min A to slide heel into shoe. Patient needing assist to initiate threading R LE into underwear but able to pull up in standing. May benefit from [facility] AE  Toilet Transfer Minimal assistance;Ambulation;Rolling walker (2 wheels);Grab bars;Comfort height toilet  Toilet Transfer Details (indicate cue type and reason) Patient needing verbal cues to pull on grab bar vs walker to stand, light min A to power up to standing  Toileting- Water quality scientist and Hygiene Min guard;Sit to/from stand  Toileting - Clothing Manipulation Details (indicate cue type and reason) for balance/safety  Functional mobility during ADLs Min guard;Minimal assistance;Rolling walker (2 wheels)  General ADL Comments Patient needing increased assistance with functional transfers and LB ADLs, would  benefit from education on compensatory strategies /  LH AE  Bed Mobility  Overal bed mobility Needs Assistance  Bed Mobility Supine to Sit  Supine to sit Min assist  General bed mobility comments assistance with the right leg, cues for  use of UE's  Balance  Overall balance assessment Needs assistance;History of Falls  Sitting-balance support Feet supported  Sitting balance-Leahy Scale Good  Standing balance support No upper extremity supported;During functional activity  Standing balance-Leahy Scale Fair  Standing balance comment While washing hands  OT - End of Session  Equipment Utilized During Treatment Gait belt;Rolling walker (2 wheels)  Activity Tolerance Patient tolerated treatment well  Patient left in chair;with call bell/phone within reach;with family/visitor present;with chair alarm set  Nurse Communication Mobility status  OT Assessment  OT Recommendation/Assessment Patient needs continued OT Services  OT Visit Diagnosis Unsteadiness on feet (R26.81);Other abnormalities of gait and mobility (R26.89);History of falling (Z91.81);Pain  Pain - Right/Left Right  Pain - part of body Hip;Leg  OT Problem List Decreased activity tolerance;Impaired balance (sitting and/or standing);Decreased safety awareness;Decreased knowledge of use of DME or AE;Pain  OT Plan  OT Frequency (ACUTE ONLY) Min 2X/week  OT Treatment/Interventions (ACUTE ONLY) Self-care/ADL training;DME and/or AE instruction;Therapeutic activities;Patient/family education;Balance training  AM-PAC OT "6 Clicks" Daily Activity Outcome Measure (Version 2)  Help from another person eating meals? 4  Help from another person taking care of personal grooming? 3  Help from another person toileting, which includes using toliet, bedpan, or urinal? 3  Help from another person bathing (including washing, rinsing, drying)? 3  Help from another person to put on and taking off regular upper body clothing? 3  Help from another person  to put on and taking off regular lower body clothing? 2  6 Click Score 18  Progressive Mobility  What is the highest level of mobility based on the progressive mobility assessment? Level 5 (Walks with assist in room/hall) - Balance while stepping forward/back and can walk in room with assist - Complete  Activity Ambulated with assistance to bathroom;Transferred from bed to chair  OT Recommendation  Follow Up Recommendations Home health OT (vs SNF if family unable to provide assistance)  Assistance recommended at discharge Frequent or constant Supervision/Assistance  Patient can return home with the following A little help with walking and/or transfers;A little help with bathing/dressing/bathroom;Assistance with cooking/housework;Assist for transportation;Help with stairs or ramp for entrance  Functional Status Assessent Patient has had a recent decline in their functional status and demonstrates the ability to make significant improvements in function in a reasonable and predictable amount of time.  OT Equipment BSC/3in1;Other (comment) (long handle AE)  Individuals Consulted  Consulted and Agree with Results and Recommendations Patient  Acute Rehab OT Goals  Patient Stated Goal Be able to care for herself  OT Goal Formulation With patient  Time For Goal Achievement 09/26/21  Potential to Achieve Goals Good  OT Time Calculation  OT Start Time (ACUTE ONLY) 1146  OT Stop Time (ACUTE ONLY) 1222  OT Time Calculation (min) 36 min  OT General Charges  $OT Visit 1 Visit  OT Evaluation  $OT Eval Low Complexity 1 Low  Written Expression  Dominant Hand Right   Delbert Phenix OT OT pager: [phone]

## 2021-09-12 NOTE — Progress Notes (Signed)
[facility] PROGRESS NOTE    Progress Note  Valerie Decker  WUJ:[id number] DOB: 15-Dec-1930 DOA: 09/10/2021 PCP: Maurice Small, MD     Brief Narrative:   Valerie Decker is an 88 y.o. female past medical history significant for essential hypertension, hyperlipidemia GERD comes into the ED on 09/10/2020 after right hip pain patient reports a fall 1 week prior to admission right hip x-ray showed no dislocation, CT of the pelvis with contrast showed nondisplaced impacted right femoral neck fracture orthopedic surgery was consulted patient was transferred to [facility], surgical intervention was performed 09/11/2021.    Assessment/Plan:   Closed right hip fracture, initial encounter [facility]): Orthopedic surgery was consulted and performed surgical intervention on 09/11/2021 with a total hip arthroplasty. Narcotics and anticoagulation per orthopedic. Awaiting PT OT evaluation, patient prefers to go home with husband and daughter.  Hypothyroidism: Continue Synthroid.  Depression/anxiety: Continue Celexa and Effexor.  Hyperlipidemia:  continue statins.  Central hypertension: Sign continue Cardizem, lisinopril elevated will be as needed.  Asthma: Continue current inhalers.  DVT prophylaxis: lovenox Family Communication:none Status is: Inpatient  Remains inpatient appropriate because: Acute hip fracture awaiting physical therapy and social worker recommendations        Code Status:     Code Status Orders  (From admission, onward)           Start     Ordered   09/10/21 2238  Full code  Continuous        09/10/21 2239           Code Status History     This patient has a current code status but no historical code status.      Advance Directive Documentation    [place] Most Recent Value  Type of Advance Directive Living will  Pre-existing out of facility DNR order (yellow form or pink MOST form) --  "MOST" Form in Place? --         IV  Access:   Peripheral IV   Procedures and diagnostic studies:   CT PELVIS WO CONTRAST  Result Date: 09/10/2021 CLINICAL DATA:  Pelvic trauma. EXAM: CT PELVIS WITHOUT CONTRAST TECHNIQUE: Multidetector CT imaging of the pelvis was performed following the standard protocol without intravenous contrast. RADIATION DOSE REDUCTION: This exam was performed according to the departmental dose-optimization program which includes automated exposure control, adjustment of the mA and/or kV according to patient size and/or use of iterative reconstruction technique. COMPARISON:  Right hip radiograph dated 09/10/2021. FINDINGS: Evaluation of this exam is limited in the absence of intravenous contrast. Urinary Tract:  No abnormality visualized. Bowel:  Unremarkable visualized pelvic bowel loops. Vascular/Lymphatic: Moderate atherosclerotic calcification. The visualized vasculature is otherwise unremarkable. No adenopathy within the pelvis. Reproductive:  The uterus and ovaries are grossly unremarkable. Other:  None Musculoskeletal: Nondisplaced, impacted right femoral neck fracture. No dislocation. The bones are osteopenic. Degenerative changes of the lower lumbar spine. IMPRESSION: Nondisplaced, impacted right femoral neck fracture. Electronically Signed   By: Anner Crete M.D.   On: 09/10/2021 21:55   DG Pelvis Portable  Result Date: 09/11/2021 CLINICAL DATA:  Right hip arthroplasty EXAM: PORTABLE PELVIS 1-2 VIEWS COMPARISON:  CT done on 09/10/2021 FINDINGS: Impacted fracture was seen in the subcapital portion of neck of right femur in the previous CT. There is evidence of interval right hip arthroplasty. IMPRESSION: Right hip arthroplasty. Electronically Signed   By: Elmer Picker M.D.   On: 09/11/2021 18:27   Chest Portable 1 View  Result Date: 09/10/2021  CLINICAL DATA:  Preop EXAM: PORTABLE CHEST 1 VIEW COMPARISON:  08/15/2020 FINDINGS: Heart and mediastinal contours are within normal limits. No focal  opacities or effusions. No acute bony abnormality. IMPRESSION: No active disease. Electronically Signed   By: Rolm Baptise M.D.   On: 09/10/2021 23:19   DG C-Arm 1-60 Min-No Report  Result Date: 09/11/2021 Fluoroscopy was utilized by the requesting physician.  No radiographic interpretation.   DG HIP OPERATIVE UNILAT W OR W/O PELVIS RIGHT  Result Date: 09/11/2021 CLINICAL DATA:  Right hip arthroplasty EXAM: OPERATIVE right HIP (WITH PELVIS IF PERFORMED) AP VIEWS TECHNIQUE: Fluoroscopic spot image(s) were submitted for interpretation post-operatively. COMPARISON:  09/10/2021 FINDINGS: There is interval right hip arthroplasty. IMPRESSION: Right hip arthroplasty. Electronically Signed   By: Elmer Picker M.D.   On: 09/11/2021 18:26   DG Hip Unilat W or Wo Pelvis 2-3 Views Right  Result Date: 09/10/2021 CLINICAL DATA:  Right hip pain since a fall 7 days ago. EXAM: DG HIP (WITH OR WITHOUT PELVIS) 2-3V RIGHT COMPARISON:  None. FINDINGS: Moderate to marked femoral head and neck junction spur formation with mild joint space narrowing. No fracture or dislocation seen. Lower lumbar spine degenerative changes. Atheromatous arterial calcifications. Mildly prominent stool in the colon. IMPRESSION: 1. No fracture or dislocation. 2. Moderate right hip degenerative changes. 3. Lower lumbar spine degenerative changes. Electronically Signed   By: Claudie Revering M.D.   On: 09/10/2021 12:30     Medical Consultants:   None.   Subjective:    [name] pain is controlled has not had a bowel movement.  Objective:    Vitals:   09/11/21 1930 09/11/21 2005 09/12/21 0554 09/12/21 0814  BP: 129/64 130/66 137/80   Pulse: 90 82 86   Resp: 19 18 18    Temp: (!) 97.5 F (36.4 C) (!) 97.5 F (36.4 C) 97.7 F (36.5 C)   TempSrc:  Oral Oral   SpO2: 100% 93% 98% 98%  Weight:      Height:       SpO2: 98 % O2 Flow Rate (L/min): 2 L/min   Intake/Output Summary (Last 24 hours) at 09/12/2021 1218 Last  data filed at 09/12/2021 1052 Gross per 24 hour  Intake 2937.79 ml  Output 875 ml  Net 2062.79 ml   Filed Weights   09/10/21 1932 09/11/21 0055  Weight: 58.5 kg 56.2 kg    Exam: General exam: In no acute distress. Respiratory system: Good air movement and clear to auscultation. Cardiovascular system: S1 & S2 heard, RRR. No JVD. Gastrointestinal system: Abdomen is nondistended, soft and nontender.  Extremities: No pedal edema. Skin: No rashes, lesions or ulcers Psychiatry: Judgement and insight appear normal. Mood & affect appropriate.    Data Reviewed:    Labs: Basic Metabolic Panel: Recent Labs  Lab 09/10/21 1946 09/10/21 2223 09/12/21 0512  NA 135 133* 133*  K 4.3 4.2 4.2  CL 99 100 102  CO2  --  21* 20*  GLUCOSE 120* 86 172*  BUN 23 16 18   CREATININE 0.70 0.77 0.78  CALCIUM  --  8.6* 8.1*   GFR Estimated Creatinine Clearance: 43.1 mL/min (by C-G formula based on SCr of 0.78 mg/dL). Liver Function Tests: No results for input(s): AST, ALT, ALKPHOS, BILITOT, PROT, ALBUMIN in the last 168 hours. No results for input(s): LIPASE, AMYLASE in the last 168 hours. No results for input(s): AMMONIA in the last 168 hours. Coagulation profile No results for input(s): INR, PROTIME in the last 168 hours. COVID-19  Labs  No results for input(s): DDIMER, FERRITIN, LDH, CRP in the last 72 hours.  Lab Results  Component Value Date   SARSCOV2NAA NEGATIVE 09/10/2021   [place] NEGATIVE 08/07/2020   [place] NEGATIVE 10/17/2019    CBC: Recent Labs  Lab 09/10/21 1946 09/10/21 2223 09/12/21 0512  WBC  --  8.9 12.0*  NEUTROABS  --  6.3  --   HGB 15.6* 14.1 12.3  HCT 46.0 41.3 37.3  MCV  --  95.8 96.6  PLT  --  282 256   Cardiac Enzymes: No results for input(s): CKTOTAL, CKMB, CKMBINDEX, TROPONINI in the last 168 hours. BNP (last 3 results) No results for input(s): PROBNP in the last 8760 hours. CBG: No results for input(s): GLUCAP in the last 168  hours. D-Dimer: No results for input(s): DDIMER in the last 72 hours. Hgb A1c: No results for input(s): HGBA1C in the last 72 hours. Lipid Profile: No results for input(s): CHOL, HDL, LDLCALC, TRIG, CHOLHDL, LDLDIRECT in the last 72 hours. Thyroid function studies: Recent Labs    09/11/21 0911  TSH 1.791   Anemia work up: No results for input(s): VITAMINB12, FOLATE, FERRITIN, TIBC, IRON, RETICCTPCT in the last 72 hours. Sepsis Labs: Recent Labs  Lab 09/10/21 2223 09/12/21 0512  WBC 8.9 12.0*   Microbiology Recent Results (from the past 240 hour(s))  Resp Panel by RT-PCR (Flu A&B, Covid) Nasopharyngeal Swab     Status: None   Collection Time: 09/10/21 10:27 PM   Specimen: Nasopharyngeal Swab; Nasopharyngeal(NP) swabs in vial transport medium  Result Value Ref Range Status   SARS Coronavirus 2 by RT PCR NEGATIVE NEGATIVE Final    Comment: (NOTE) SARS-CoV-2 target nucleic acids are NOT DETECTED.  The SARS-CoV-2 RNA is generally detectable in upper respiratory specimens during the acute phase of infection. The lowest concentration of SARS-CoV-2 viral copies this assay can detect is 138 copies/mL. A negative result does not preclude SARS-Cov-2 infection and should not be used as the sole basis for treatment or other patient management decisions. A negative result may occur with  improper specimen collection/handling, submission of specimen other than nasopharyngeal swab, presence of viral mutation(s) within the areas targeted by this assay, and inadequate number of viral copies(<138 copies/mL). A negative result must be combined with clinical observations, patient history, and epidemiological information. The expected result is Negative.  Fact Sheet for Patients:  EntrepreneurPulse.com.au  Fact Sheet for Healthcare Providers:  IncredibleEmployment.be  This test is no t yet approved or cleared by the [place] FDA and  has been  authorized for detection and/or diagnosis of SARS-CoV-2 by FDA under an Emergency Use Authorization (EUA). This EUA will remain  in effect (meaning this test can be used) for the duration of the COVID-19 declaration under Section 564(b)(1) of the Act, 21 U.S.C.section 360bbb-3(b)(1), unless the authorization is terminated  or revoked sooner.       Influenza A by PCR NEGATIVE NEGATIVE Final   Influenza B by PCR NEGATIVE NEGATIVE Final    Comment: (NOTE) The Xpert Xpress SARS-CoV-2/FLU/RSV plus assay is intended as an aid in the diagnosis of influenza from Nasopharyngeal swab specimens and should not be used as a sole basis for treatment. Nasal washings and aspirates are unacceptable for Xpert Xpress SARS-CoV-2/FLU/RSV testing.  Fact Sheet for Patients: EntrepreneurPulse.com.au  Fact Sheet for Healthcare Providers: IncredibleEmployment.be  This test is not yet approved or cleared by the [place] FDA and has been authorized for detection and/or diagnosis of SARS-CoV-2 by FDA under  an Emergency Use Authorization (EUA). This EUA will remain in effect (meaning this test can be used) for the duration of the COVID-19 declaration under Section 564(b)(1) of the Act, 21 U.S.C. section 360bbb-3(b)(1), unless the authorization is terminated or revoked.  Performed at [facility], [place] [address] [place], [place] [address]   Surgical PCR screen     Status: None   Collection Time: 09/11/21  1:20 AM   Specimen: Nasal Mucosa; Nasal Swab  Result Value Ref Range Status   MRSA, PCR NEGATIVE NEGATIVE Final   Staphylococcus aureus NEGATIVE NEGATIVE Final    Comment: (NOTE) The Xpert SA Assay (FDA approved for NASAL specimens in patients 67 years of age and older), is one component of a comprehensive surveillance program. It is not intended to diagnose infection nor to guide or monitor treatment. Performed at [facility] [place]  [address], [place], [place] [address]      Medications:    aspirin  81 mg Oral BID   citalopram  20 mg Oral Daily   diltiazem  120 mg Oral Daily   docusate sodium  100 mg Oral BID   ferrous sulfate  325 mg Oral TID PC   fluticasone  2 spray Each Nare Daily   levothyroxine  25 mcg Oral Q0600   lisinopril  40 mg Oral Daily   loratadine  10 mg Oral Daily   melatonin  5 mg Oral QHS   mometasone-formoterol  2 puff Inhalation BID   montelukast  10 mg Oral QHS   multivitamin with minerals  1 tablet Oral Daily   pantoprazole  80 mg Oral Daily   polyethylene glycol  17 g Oral BID   simvastatin  10 mg Oral QHS   venlafaxine XR  75 mg Oral Q breakfast   Continuous Infusions:  sodium chloride 75 mL/hr at 09/11/21 2342   methocarbamol (ROBAXIN) IV Stopped (09/11/21 1900)   promethazine (PHENERGAN) injection (IM or IVPB) Stopped (09/11/21 0050)      LOS: 2 days   Charlynne Cousins  [facility]  09/12/2021, 12:18 PM

## 2021-09-12 NOTE — Progress Notes (Signed)
Patient sat up in chair for almost 3 hours, assisted to bedside commode and back to bed with use of front wheel walker.  Patient able to tolerate activity with c/o of moderate pain on right hip.  PRN pain med given as ordered.  Assisted in position of comfort, needs addressed.

## 2021-09-12 NOTE — Progress Notes (Signed)
° °  Subjective: 1 Day Post-Op Procedure(s) (LRB): TOTAL HIP ARTHROPLASTY ANTERIOR APPROACH (Right) Patient reports pain as mild.   Patient seen in rounds by Dr. Alvan Dame. Patient is resting in bed on exam with family at the bedside. No acute events overnight. Foley catheter removed.  We will start therapy today.   Objective: Vital signs in last 24 hours: Temp:  [97.5 F (36.4 C)-97.8 F (36.6 C)] 97.7 F (36.5 C) (01/25 0554) Pulse Rate:  [76-101] 86 (01/25 0554) Resp:  [13-19] 18 (01/25 0554) BP: (109-170)/(54-89) 137/80 (01/25 0554) SpO2:  [93 %-100 %] 98 % (01/25 0814)  Intake/Output from previous day:  Intake/Output Summary (Last 24 hours) at 09/12/2021 0845 Last data filed at 09/12/2021 0700 Gross per 24 hour  Intake 2592.84 ml  Output 875 ml  Net 1717.84 ml     Intake/Output this shift: No intake/output data recorded.  Labs: Recent Labs    09/10/21 1946 09/10/21 2223 09/12/21 0512  HGB 15.6* 14.1 12.3   Recent Labs    09/10/21 2223 09/12/21 0512  WBC 8.9 12.0*  RBC 4.31 3.86*  HCT 41.3 37.3  PLT 282 256   Recent Labs    09/10/21 2223 09/12/21 0512  NA 133* 133*  K 4.2 4.2  CL 100 102  CO2 21* 20*  BUN 16 18  CREATININE 0.77 0.78  GLUCOSE 86 172*  CALCIUM 8.6* 8.1*   No results for input(s): LABPT, INR in the last 72 hours.  Exam: General - Patient is Alert and Oriented Extremity - Neurologically intact Sensation intact distally Intact pulses distally Dorsiflexion/Plantar flexion intact Dressing - dressing C/D/I Motor Function - intact, moving foot and toes well on exam.   Past Medical History:  Diagnosis Date   Allergic rhinitis, cause unspecified    Asthma    Carpal tunnel syndrome    Diverticulosis    Elevated serum homocysteine level    Esophageal reflux    H. pylori infection 2010   Irritable bowel syndrome    Irritable bowel syndrome with diarrhea    Mixed hyperlipidemia    Neuralgia, neuritis, and radiculitis, unspecified     Osteopenia    Unspecified asthma(493.90)    Unspecified essential hypertension    Unspecified hypothyroidism    Unspecified sleep apnea    marginal; intolerant to CPAP    Assessment/Plan: 1 Day Post-Op Procedure(s) (LRB): TOTAL HIP ARTHROPLASTY ANTERIOR APPROACH (Right) Principal Problem:   Closed right hip fracture, initial encounter (HCC) Active Problems:   Hypothyroidism   HYPERLIPIDEMIA   HTN (hypertension)   Asthma   Malnutrition of moderate degree   S/P total right hip arthroplasty  Estimated body mass index is 20.62 kg/m as calculated from the following:   Height as of this encounter: 5\' 5"  (1.651 m).   Weight as of this encounter: 56.2 kg. Advance diet Up with therapy  DVT Prophylaxis - Aspirin 81 BID x4 weeks Weight bearing as tolerated.  Hgb stable at 12.3 this AM.  Plan to get up with PT to work on mobilization and gait training, and to help determine disposition. It seems she will likely progress to d/c home as she has help of her husband and daughter.   Will be ready for d/c from orthopaedic standpoint once meeting goals with PT.   Griffith Citron, PA-C Orthopedic Surgery [phone] 09/12/2021, 8:45 AM

## 2021-09-12 NOTE — Anesthesia Postprocedure Evaluation (Signed)
Anesthesia Post Note  Patient: Valerie Decker  Procedure(s) Performed: TOTAL HIP ARTHROPLASTY ANTERIOR APPROACH (Right: Hip)     Patient location during evaluation: PACU Anesthesia Type: General Level of consciousness: awake and alert Pain management: pain level controlled Vital Signs Assessment: post-procedure vital signs reviewed and stable Respiratory status: spontaneous breathing, nonlabored ventilation, respiratory function stable and patient connected to nasal cannula oxygen Cardiovascular status: blood pressure returned to baseline and stable Postop Assessment: no apparent nausea or vomiting Anesthetic complications: no   No notable events documented.  Last Vitals:  Vitals:   09/11/21 2005 09/12/21 0554  BP: 130/66 137/80  Pulse: 82 86  Resp: 18 18  Temp: (!) 36.4 C 36.5 C  SpO2: 93% 98%    Last Pain:  Vitals:   09/12/21 0554  TempSrc: Oral  PainSc:                  [place]

## 2021-09-12 NOTE — Progress Notes (Signed)
Pt awake alert and oriented to person and place. Disoriented to time but easily reoriented. Denies pain or discomfort. Surgical dressing clean dry and intact. Indwelling catheter discontinued per order. Husband at bedside. Will continue to monitor.

## 2021-09-12 NOTE — Evaluation (Signed)
Physical Therapy Evaluation Patient Details Name: Valerie Decker MRN: [id number] DOB: [birth year] Today's Date: 09/12/2021  History of Present Illness  Valerie Decker is a 88 year old female with past medical history significant for essential hypertension, hyperlipidemia, hypothyroidism, depression, GERD who presented to [facility] ED on 1/23 with right hip pain.  Patient reports fall 1 week PTA. Sustained right femoral neck fracture. A/P right THA through direct anterior approach on 09/11/21  Clinical Impression  The patient  requiring min assistance for bed mobility and ambulation using RW. Patient Independent PTA, lives with spouse. Patient currently will require 24/7 assistance for mobility and ADL's.  Spouse uses cane and unable to physically assist. Recommend SNF unless patient has caregivers to assist. Pt admitted with above diagnosis.  Pt currently with functional limitations due to the deficits listed below (see PT Problem List). Pt will benefit from skilled PT to increase their independence and safety with mobility to allow discharge to the venue listed below.        Recommendations for follow up therapy are one component of a multi-disciplinary discharge planning process, led by the attending physician.  Recommendations may be updated based on patient status, additional functional criteria and insurance authorization.  Follow Up Recommendations Skilled nursing-short term rehab (<3 hours/day) or HHPT if family available 24/7 at DC.    Assistance Recommended at Discharge Frequent or constant Supervision/Assistance  Patient can return home with the following  A little help with walking and/or transfers;Assistance with cooking/housework;Assist for transportation;Direct supervision/assist for medications management;A little help with bathing/dressing/bathroom;Help with stairs or ramp for entrance    Equipment Recommendations None recommended by PT  Recommendations for Other Services        Functional Status Assessment Patient has had a recent decline in their functional status and demonstrates the ability to make significant improvements in function in a reasonable and predictable amount of time.     Precautions / Restrictions Precautions Precautions: Fall Restrictions Weight Bearing Restrictions: No RLE Weight Bearing: Weight bearing as tolerated      Mobility  Bed Mobility Overal bed mobility: Needs Assistance Bed Mobility: Supine to Sit     Supine to sit: Min assist     General bed mobility comments: assistance with the right leg, cues for  use of UE's    Transfers Overall transfer level: Needs assistance Equipment used: Rolling walker (2 wheels) Transfers: Sit to/from Stand Sit to Stand: Min assist           General transfer comment: from bed, cues for hand placement and assist to rise.    Ambulation/Gait Ambulation/Gait assistance: Min assist Gait Distance (Feet): 15 Feet (then 60') Assistive device: Rolling walker (2 wheels) Gait Pattern/deviations: Step-to pattern, Step-through pattern, Antalgic       General Gait Details: cues for sequence, stay on task, patient reported feeling dizzy. and nausea.  Stairs            Wheelchair Mobility    Modified Rankin (Stroke Patients Only)       Balance Overall balance assessment: Needs assistance, History of Falls Sitting-balance support: Feet supported, Bilateral upper extremity supported Sitting balance-Leahy Scale: Good     Standing balance support: Bilateral upper extremity supported, Reliant on assistive device for balance, During functional activity Standing balance-Leahy Scale: Poor Standing balance comment: requires support  to pullup panties                             Pertinent Vitals/Pain Pain Assessment  Pain Assessment: Faces Faces Pain Scale: Hurts whole lot Pain Location: right hip Pain Descriptors / Indicators: Aching, Discomfort Pain Intervention(s):  Monitored during session, Limited activity within patient's tolerance    Home Living Family/patient expects to be discharged to:: Private residence Living Arrangements: Spouse/significant other Available Help at Discharge: Family Type of Home: House Home Access: Stairs to enter   Technical brewer of Steps: 1   Home Layout: Bed/bath upstairs;Two level Home Equipment: Grab bars - tub/shower;Shower seat;Rolling Environmental consultant (2 wheels) Additional Comments: lives with spouse who uses a cane,  adaughter is coming to stay on 1/30.    Prior Function Prior Level of Function : Independent/Modified Independent             Mobility Comments: does not drive ADLs Comments: independent     Hand Dominance   Dominant Hand: Right    Extremity/Trunk Assessment        Lower Extremity Assessment Lower Extremity Assessment: RLE deficits/detail RLE Deficits / Details: able to advance  leg and WB    Cervical / Trunk Assessment Cervical / Trunk Assessment: Normal  Communication   Communication: No difficulties  Cognition                                                General Comments      Exercises     Assessment/Plan    PT Assessment Patient needs continued PT services  PT Problem List Decreased strength;Decreased balance;Decreased knowledge of precautions;Decreased range of motion;Decreased mobility;Decreased knowledge of use of DME;Decreased activity tolerance;Pain       PT Treatment Interventions DME instruction;Therapeutic activities;Gait training;Therapeutic exercise;Patient/family education;Stair training;Balance training;Functional mobility training    PT Goals (Current goals can be found in the Care Plan section)  Acute Rehab PT Goals Patient Stated Goal: to  go home. PT Goal Formulation: With patient/family Time For Goal Achievement: 09/26/21 Potential to Achieve Goals: Good    Frequency Min 5X/week     Co-evaluation                AM-PAC PT "6 Clicks" Mobility  Outcome Measure Help needed turning from your back to your side while in a flat bed without using bedrails?: A Lot Help needed moving from lying on your back to sitting on the side of a flat bed without using bedrails?: A Lot Help needed moving to and from a bed to a chair (including a wheelchair)?: A Little Help needed standing up from a chair using your arms (e.g., wheelchair or bedside chair)?: A Little Help needed to walk in hospital room?: A Little Help needed climbing 3-5 steps with a railing? : A Lot 6 Click Score: 15    End of Session Equipment Utilized During Treatment: Gait belt Activity Tolerance: Patient tolerated treatment well Patient left: in chair;with call bell/phone within reach;with chair alarm set;with family/visitor present Nurse Communication: Mobility status PT Visit Diagnosis: Unsteadiness on feet (R26.81);Difficulty in walking, not elsewhere classified (R26.2);Pain Pain - Right/Left: Right Pain - part of body: Hip    Time: 8144-8185 PT Time Calculation (min) (ACUTE ONLY): 48 min   Charges:   PT Evaluation $PT Eval Low Complexity: 1 Low PT Treatments $Gait Training: 8-22 mins        Tresa Endo PT Acute Rehabilitation Services Pager [phone] Office [phone]   Claretha Cooper 09/12/2021, 1:32 PM

## 2021-09-13 LAB — CBC
HCT: 31.7 % — ABNORMAL LOW (ref 36.0–46.0)
Hemoglobin: 10.6 g/dL — ABNORMAL LOW (ref 12.0–15.0)
MCH: 31.8 pg (ref 26.0–34.0)
MCHC: 33.4 g/dL (ref 30.0–36.0)
MCV: 95.2 fL (ref 80.0–100.0)
Platelets: 241 10*3/uL (ref 150–400)
RBC: 3.33 MIL/uL — ABNORMAL LOW (ref 3.87–5.11)
RDW: 13.8 % (ref 11.5–15.5)
WBC: 15.9 10*3/uL — ABNORMAL HIGH (ref 4.0–10.5)
nRBC: 0 % (ref 0.0–0.2)

## 2021-09-13 NOTE — Care Management Important Message (Signed)
Important Message  Patient Details IM Letter placed in Patients room. Name: Valerie Decker MRN: [id number] Date of Birth: 05-Oct-1930   Medicare Important Message Given:  Yes     Kerin Salen 09/13/2021, 12:48 PM

## 2021-09-13 NOTE — Plan of Care (Signed)
Pt continues to have some ongoing confusion overnight, Aox2-3. Reorientation provided when necessary. Pt c/o muscle pain well managed with PRN medications. VSS. CMS intact. Husband at bedside. Adequate PO intake. Voiding without difficulty. Pt does express some concerns about stool incontinence related to her IBS, states she is passing flatus. Education and support provided, denies any further needs at this time.

## 2021-09-13 NOTE — TOC Initial Note (Signed)
Transition of Care [facility]) - Initial/Assessment Note    Patient Details  Name: Valerie Decker MRN: [id number] Date of Birth: [birth year]  [facility] CM/SW Contact:    Lynnell Catalan, RN Phone Number: 09/13/2021, 3:18 PM  Clinical Narrative:                 Spoke with pt and husband at bedside. SNF recommendation explained. Permission given to fax out FL2 to area SNFs. FL2 faxed out and received multiple bed offers. Bed offers given to husband at bedside. [facility] chosen. [facility] liaison contacted to accept bed. [facility] to start insurance auth. TOC will continue to follow.             Activities of Daily Living Home Assistive Devices/Equipment: Gilford Rile (specify type) ADL Screening (condition at time of admission) Patient's cognitive ability adequate to safely complete daily activities?: Yes Is the patient deaf or have difficulty hearing?: No Does the patient have difficulty seeing, even when wearing glasses/contacts?: No Does the patient have difficulty concentrating, remembering, or making decisions?: No Patient able to express need for assistance with ADLs?: Yes Does the patient have difficulty dressing or bathing?: No Independently performs ADLs?: Yes (appropriate for developmental age) Does the patient have difficulty walking or climbing stairs?: Yes Weakness of Legs: Right Weakness of Arms/Hands: None       Admission diagnosis:  Pre-op chest exam [Z01.811] Closed fracture of neck of right femur, initial encounter ([place]) [S72.001A] Fall, initial encounter [W19.XXXA] Closed right hip fracture, initial encounter ([place]) [S72.001A] Patient Active Problem List   Diagnosis Date Noted   Malnutrition of moderate degree 09/11/2021   S/P total right hip arthroplasty 09/11/2021   Closed right hip fracture, initial encounter ([place]) 09/10/2021   Loose stools 12/19/2016   Generalized abdominal pain 12/19/2016   Syncope and collapse 07/01/2012   Unspecified  adverse effect of unspecified drug, medicinal and biological substance 03/12/2012   Pulmonary nodule 03/12/2012   OSTEOPENIA 06/05/2010   ALLERGIC RHINITIS 04/18/2009   GERD 11/08/2008   IRRITABLE BOWEL SYNDROME 10/21/2008   HYPERLIPIDEMIA 07/31/2007   SYMPTOM, APNEA, SLEEP NOS 06/09/2007   Hypothyroidism 05/14/2007   HTN (hypertension) 05/14/2007   RADICULOPATHY 01/21/2007   CARPAL TUNNEL SYNDROME 11/24/2006   Asthma 11/24/2006   PCP:  Maurice Small, MD Pharmacy:   [facility] [address] [place] [address] Phone: [phone] Fax: [phone]     Social Determinants of Health (SDOH) Interventions    Readmission Risk Interventions No flowsheet data found.

## 2021-09-13 NOTE — Progress Notes (Signed)
Physical Therapy Treatment Patient Details Name: Valerie Decker MRN: [id number] DOB: 05-05-1931 Today's Date: 09/13/2021   History of Present Illness Valerie Decker is a 88 year old female who presented to ED on 1/23 with right hip pain.  Patient reports fall 1 week PTA. Sustained right femoral neck fracture. A/P right THA through direct anterior approach on 09/11/21. PMH: essential hypertension, hyperlipidemia, hypothyroidism, depression, GERD    PT Comments    The patient is very pleasant, tangential speaking. Patient ambulated x 50' with Rw and min assistance, cues for posture   and sequence. Patient's spouse present and will not be able to proiide physical support for mobility. Recommend SNF.  Recommendations for follow up therapy are one component of a multi-disciplinary discharge planning process, led by the attending physician.  Recommendations may be updated based on patient status, additional functional criteria and insurance authorization.  Follow Up Recommendations  Skilled nursing-short term rehab (<3 hours/day)     Assistance Recommended at Discharge Frequent or constant Supervision/Assistance  Patient can return home with the following A little help with walking and/or transfers;Assistance with cooking/housework;Assist for transportation;Direct supervision/assist for medications management;A little help with bathing/dressing/bathroom;Help with stairs or ramp for entrance   Equipment Recommendations  None recommended by PT    Recommendations for Other Services       Precautions / Restrictions Precautions Precautions: Fall Restrictions RLE Weight Bearing: Weight bearing as tolerated     Mobility  Bed Mobility   Bed Mobility: Supine to Sit     Supine to sit: Min assist     General bed mobility comments: assistance with the right leg, cues for  use of UE's    Transfers Overall transfer level: Needs assistance Equipment used: Rolling walker (2 wheels) Transfers:  Sit to/from Stand Sit to Stand: Min assist           General transfer comment: from bed, cues for hand placement and assist to rise.    Ambulation/Gait Ambulation/Gait assistance: Min assist Gait Distance (Feet): 50 Feet Assistive device: Rolling walker (2 wheels) Gait Pattern/deviations: Step-to pattern, Step-through pattern, Antalgic Gait velocity: decr     General Gait Details: cues for sequence, stay on task,   Stairs             Wheelchair Mobility    Modified Rankin (Stroke Patients Only)       Balance Overall balance assessment: Needs assistance, History of Falls Sitting-balance support: Feet supported Sitting balance-Leahy Scale: Good     Standing balance support: During functional activity, Reliant on assistive device for balance, Bilateral upper extremity supported Standing balance-Leahy Scale: Fair                              Cognition Arousal/Alertness: Awake/alert Behavior During Therapy: WFL for tasks assessed/performed Overall Cognitive Status: Within Functional Limits for tasks assessed                                 General Comments: Grossly oriented, tangential.Talks constantly        Exercises Total Joint Exercises Ankle Circles/Pumps: AROM, Both, 10 reps Short Arc Quad: AROM, Right, 10 reps, Supine Heel Slides: AAROM, Right, 10 reps, Supine Hip ABduction/ADduction: AAROM, Right, 10 reps, Supine    General Comments        Pertinent Vitals/Pain Pain Assessment Faces Pain Scale: Hurts little more Pain Location: right hip and groin Pain Descriptors /  Indicators: Sore Pain Intervention(s): Monitored during session, Premedicated before session    Home Living                          Prior Function            PT Goals (current goals can now be found in the care plan section) Progress towards PT goals: Progressing toward goals    Frequency    Min 3X/week      PT Plan Current  plan remains appropriate    Co-evaluation              AM-PAC PT "6 Clicks" Mobility   Outcome Measure  Help needed turning from your back to your side while in a flat bed without using bedrails?: A Little Help needed moving from lying on your back to sitting on the side of a flat bed without using bedrails?: A Little Help needed moving to and from a bed to a chair (including a wheelchair)?: A Little Help needed standing up from a chair using your arms (e.g., wheelchair or bedside chair)?: A Little Help needed to walk in hospital room?: A Little Help needed climbing 3-5 steps with a railing? : A Lot 6 Click Score: 17    End of Session Equipment Utilized During Treatment: Gait belt Activity Tolerance: Patient tolerated treatment well Patient left: in chair;with call bell/phone within reach;with chair alarm set;with family/visitor present Nurse Communication: Mobility status PT Visit Diagnosis: Unsteadiness on feet (R26.81);Difficulty in walking, not elsewhere classified (R26.2);Pain Pain - Right/Left: Right Pain - part of body: Hip     Time: 0370-4888 PT Time Calculation (min) (ACUTE ONLY): 41 min  Charges:  $Gait Training: 8-22 mins $Therapeutic Exercise: 8-22 mins           Self care 8-22          Tool Pager [phone] Office [phone]    Claretha Cooper 09/13/2021, 2:28 PM

## 2021-09-13 NOTE — Progress Notes (Signed)
[facility] PROGRESS NOTE    Progress Note  Valerie Decker  STM:[id number] DOB: 1931/03/07 DOA: 09/10/2021 PCP: Maurice Small, MD     Brief Narrative:   Valerie Decker is an 88 y.o. female past medical history significant for essential hypertension, hyperlipidemia GERD comes into the ED on 09/10/2020 after right hip pain patient reports a fall 1 week prior to admission right hip x-ray showed no dislocation, CT of the pelvis with contrast showed nondisplaced impacted right femoral neck fracture orthopedic surgery was consulted patient was transferred to [facility], surgical intervention was performed 09/11/2021.   Assessment/Plan:   Closed right hip fracture, initial encounter [facility]): Orthopedic surgery was consulted and performed surgical intervention on 09/11/2021 with a total hip arthroplasty. Narcotics and anticoagulation per orthopedic. Physical therapy evaluated the patient and recommended short-term rehab  Hypothyroidism: Continue Synthroid.  Depression/anxiety: Continue Celexa and Effexor.  Hyperlipidemia:  continue statins.  Central hypertension:  Continue Cardizem, Hold lisinopril.  Asthma: Continue current inhalers.  DVT prophylaxis: lovenox Family Communication:none Status is: Inpatient  Remains inpatient appropriate because: Acute hip fracture awaiting physical therapy and social worker recommendations        Code Status:     Code Status Orders  (From admission, onward)           Start     Ordered   09/10/21 2238  Full code  Continuous        09/10/21 2239           Code Status History     This patient has a current code status but no historical code status.      Advance Directive Documentation    [place] Most Recent Value  Type of Advance Directive Living will  Pre-existing out of facility DNR order (yellow form or pink MOST form) --  "MOST" Form in Place? --         IV Access:   Peripheral  IV   Procedures and diagnostic studies:   DG Pelvis Portable  Result Date: 09/11/2021 CLINICAL DATA:  Right hip arthroplasty EXAM: PORTABLE PELVIS 1-2 VIEWS COMPARISON:  CT done on 09/10/2021 FINDINGS: Impacted fracture was seen in the subcapital portion of neck of right femur in the previous CT. There is evidence of interval right hip arthroplasty. IMPRESSION: Right hip arthroplasty. Electronically Signed   By: Elmer Picker M.D.   On: 09/11/2021 18:27   DG C-Arm 1-60 Min-No Report  Result Date: 09/11/2021 Fluoroscopy was utilized by the requesting physician.  No radiographic interpretation.   DG HIP OPERATIVE UNILAT W OR W/O PELVIS RIGHT  Result Date: 09/11/2021 CLINICAL DATA:  Right hip arthroplasty EXAM: OPERATIVE right HIP (WITH PELVIS IF PERFORMED) AP VIEWS TECHNIQUE: Fluoroscopic spot image(s) were submitted for interpretation post-operatively. COMPARISON:  09/10/2021 FINDINGS: There is interval right hip arthroplasty. IMPRESSION: Right hip arthroplasty. Electronically Signed   By: Elmer Picker M.D.   On: 09/11/2021 18:26     Medical Consultants:   None.   Subjective:    Arali Somera relates her pain is controlled.  Objective:    Vitals:   09/12/21 1758 09/12/21 2151 09/13/21 0540 09/13/21 0904  BP:  133/81 (!) 146/81   Pulse:  89 85   Resp:  16 16   Temp:  98.2 F (36.8 C) 97.6 F (36.4 C)   TempSrc:  Oral Oral   SpO2: 99% 99% 97% 97%  Weight:      Height:       SpO2: 97 % O2 Flow  Rate (L/min): 2 L/min   Intake/Output Summary (Last 24 hours) at 09/13/2021 1134 Last data filed at 09/12/2021 2133 Gross per 24 hour  Intake 1139.96 ml  Output --  Net 1139.96 ml    Filed Weights   09/10/21 1932 09/11/21 0055  Weight: 58.5 kg 56.2 kg    Exam: General exam: In no acute distress. Respiratory system: Good air movement and clear to auscultation. Cardiovascular system: S1 & S2 heard, RRR. No JVD. Gastrointestinal system: Abdomen is  nondistended, soft and nontender.  Extremities: No pedal edema. Skin: No rashes, lesions or ulcers Psychiatry: Judgement and insight appear normal. Mood & affect appropriate.   Data Reviewed:    Labs: Basic Metabolic Panel: Recent Labs  Lab 09/10/21 1946 09/10/21 2223 09/12/21 0512  NA 135 133* 133*  K 4.3 4.2 4.2  CL 99 100 102  CO2  --  21* 20*  GLUCOSE 120* 86 172*  BUN 23 16 18   CREATININE 0.70 0.77 0.78  CALCIUM  --  8.6* 8.1*    GFR Estimated Creatinine Clearance: 43.1 mL/min (by C-G formula based on SCr of 0.78 mg/dL). Liver Function Tests: No results for input(s): AST, ALT, ALKPHOS, BILITOT, PROT, ALBUMIN in the last 168 hours. No results for input(s): LIPASE, AMYLASE in the last 168 hours. No results for input(s): AMMONIA in the last 168 hours. Coagulation profile No results for input(s): INR, PROTIME in the last 168 hours. COVID-19 Labs  No results for input(s): DDIMER, FERRITIN, LDH, CRP in the last 72 hours.  Lab Results  Component Value Date   SARSCOV2NAA NEGATIVE 09/10/2021   [place] NEGATIVE 08/07/2020   [place] NEGATIVE 10/17/2019    CBC: Recent Labs  Lab 09/10/21 1946 09/10/21 2223 09/12/21 0512 09/13/21 0451  WBC  --  8.9 12.0* 15.9*  NEUTROABS  --  6.3  --   --   HGB 15.6* 14.1 12.3 10.6*  HCT 46.0 41.3 37.3 31.7*  MCV  --  95.8 96.6 95.2  PLT  --  282 256 241    Cardiac Enzymes: No results for input(s): CKTOTAL, CKMB, CKMBINDEX, TROPONINI in the last 168 hours. BNP (last 3 results) No results for input(s): PROBNP in the last 8760 hours. CBG: No results for input(s): GLUCAP in the last 168 hours. D-Dimer: No results for input(s): DDIMER in the last 72 hours. Hgb A1c: No results for input(s): HGBA1C in the last 72 hours. Lipid Profile: No results for input(s): CHOL, HDL, LDLCALC, TRIG, CHOLHDL, LDLDIRECT in the last 72 hours. Thyroid function studies: Recent Labs    09/11/21 0911  TSH 1.791    Anemia work up: No  results for input(s): VITAMINB12, FOLATE, FERRITIN, TIBC, IRON, RETICCTPCT in the last 72 hours. Sepsis Labs: Recent Labs  Lab 09/10/21 2223 09/12/21 0512 09/13/21 0451  WBC 8.9 12.0* 15.9*    Microbiology Recent Results (from the past 240 hour(s))  Resp Panel by RT-PCR (Flu A&B, Covid) Nasopharyngeal Swab     Status: None   Collection Time: 09/10/21 10:27 PM   Specimen: Nasopharyngeal Swab; Nasopharyngeal(NP) swabs in vial transport medium  Result Value Ref Range Status   SARS Coronavirus 2 by RT PCR NEGATIVE NEGATIVE Final    Comment: (NOTE) SARS-CoV-2 target nucleic acids are NOT DETECTED.  The SARS-CoV-2 RNA is generally detectable in upper respiratory specimens during the acute phase of infection. The lowest concentration of SARS-CoV-2 viral copies this assay can detect is 138 copies/mL. A negative result does not preclude SARS-Cov-2 infection and should not be used  as the sole basis for treatment or other patient management decisions. A negative result may occur with  improper specimen collection/handling, submission of specimen other than nasopharyngeal swab, presence of viral mutation(s) within the areas targeted by this assay, and inadequate number of viral copies(<138 copies/mL). A negative result must be combined with clinical observations, patient history, and epidemiological information. The expected result is Negative.  Fact Sheet for Patients:  EntrepreneurPulse.com.au  Fact Sheet for Healthcare Providers:  IncredibleEmployment.be  This test is no t yet approved or cleared by the [place] FDA and  has been authorized for detection and/or diagnosis of SARS-CoV-2 by FDA under an Emergency Use Authorization (EUA). This EUA will remain  in effect (meaning this test can be used) for the duration of the COVID-19 declaration under Section 564(b)(1) of the Act, 21 U.S.C.section 360bbb-3(b)(1), unless the authorization is  terminated  or revoked sooner.       Influenza A by PCR NEGATIVE NEGATIVE Final   Influenza B by PCR NEGATIVE NEGATIVE Final    Comment: (NOTE) The Xpert Xpress SARS-CoV-2/FLU/RSV plus assay is intended as an aid in the diagnosis of influenza from Nasopharyngeal swab specimens and should not be used as a sole basis for treatment. Nasal washings and aspirates are unacceptable for Xpert Xpress SARS-CoV-2/FLU/RSV testing.  Fact Sheet for Patients: EntrepreneurPulse.com.au  Fact Sheet for Healthcare Providers: IncredibleEmployment.be  This test is not yet approved or cleared by the [place] FDA and has been authorized for detection and/or diagnosis of SARS-CoV-2 by FDA under an Emergency Use Authorization (EUA). This EUA will remain in effect (meaning this test can be used) for the duration of the COVID-19 declaration under Section 564(b)(1) of the Act, 21 U.S.C. section 360bbb-3(b)(1), unless the authorization is terminated or revoked.  Performed at [facility], [place] [address] [place], [place] [address]   Surgical PCR screen     Status: None   Collection Time: 09/11/21  1:20 AM   Specimen: Nasal Mucosa; Nasal Swab  Result Value Ref Range Status   MRSA, PCR NEGATIVE NEGATIVE Final   Staphylococcus aureus NEGATIVE NEGATIVE Final    Comment: (NOTE) The Xpert SA Assay (FDA approved for NASAL specimens in patients 75 years of age and older), is one component of a comprehensive surveillance program. It is not intended to diagnose infection nor to guide or monitor treatment. Performed at [facility], [place] [address], [place], [place] [address]      Medications:    aspirin  81 mg Oral BID   citalopram  20 mg Oral Daily   diltiazem  120 mg Oral Daily   docusate sodium  100 mg Oral BID   ferrous sulfate  325 mg Oral TID PC   fluticasone  2 spray Each Nare Daily   levothyroxine  25 mcg Oral Q0600    lisinopril  40 mg Oral Daily   loratadine  10 mg Oral Daily   melatonin  5 mg Oral QHS   mometasone-formoterol  2 puff Inhalation BID   montelukast  10 mg Oral QHS   multivitamin with minerals  1 tablet Oral Daily   pantoprazole  80 mg Oral Daily   polyethylene glycol  17 g Oral BID   simvastatin  10 mg Oral QHS   venlafaxine XR  75 mg Oral Q breakfast   Continuous Infusions:  sodium chloride Stopped (09/12/21 2133)   methocarbamol (ROBAXIN) IV Stopped (09/11/21 1900)   promethazine (PHENERGAN) injection (IM or IVPB) Stopped (09/11/21  0050)      LOS: 3 days   Charlynne Cousins  [facility]  09/13/2021, 11:34 AM

## 2021-09-13 NOTE — NC FL2 (Signed)
[place] LEVEL OF CARE SCREENING TOOL     IDENTIFICATION  Patient Name: Genni Buske Birthdate: 10/22/1930 Sex: female Admission Date (Current Location): 09/10/2021  [facility] and [place] Number:  Herbalist and Address:  [facility],  [place], [place]      Provider Number: [id number]  Attending Physician Name and Address:  Charlynne Cousins, MD  Relative Name and Phone Number:       Current Level of Care: Hospital Recommended Level of Care: [place] Prior Approval Number:    Date Approved/Denied:   PASRR Number: [id number]  Discharge Plan: SNF    Current Diagnoses: Patient Active Problem List   Diagnosis Date Noted   Malnutrition of moderate degree 09/11/2021   S/P total right hip arthroplasty 09/11/2021   Closed right hip fracture, initial encounter ([place]) 09/10/2021   Loose stools 12/19/2016   Generalized abdominal pain 12/19/2016   Syncope and collapse 07/01/2012   Unspecified adverse effect of unspecified drug, medicinal and biological substance 03/12/2012   Pulmonary nodule 03/12/2012   OSTEOPENIA 06/05/2010   ALLERGIC RHINITIS 04/18/2009   GERD 11/08/2008   IRRITABLE BOWEL SYNDROME 10/21/2008   HYPERLIPIDEMIA 07/31/2007   SYMPTOM, APNEA, SLEEP NOS 06/09/2007   Hypothyroidism 05/14/2007   HTN (hypertension) 05/14/2007   RADICULOPATHY 01/21/2007   CARPAL TUNNEL SYNDROME 11/24/2006   Asthma 11/24/2006    Orientation RESPIRATION BLADDER Height & Weight     Self, Time, Situation, Place  Normal Incontinent Weight: 56.2 kg Height:  5\' 5"  (165.1 cm)  BEHAVIORAL SYMPTOMS/MOOD NEUROLOGICAL BOWEL NUTRITION STATUS      Continent Diet (Regular)  AMBULATORY STATUS COMMUNICATION OF NEEDS Skin   Extensive Assist Verbally Surgical wounds                       Personal Care Assistance Level of Assistance  Bathing, Feeding, Dressing Bathing Assistance: Limited assistance Feeding  assistance: Independent Dressing Assistance: Limited assistance     Functional Limitations Info  Sight, Hearing, Speech Sight Info: Impaired Hearing Info: Adequate Speech Info: Adequate    SPECIAL CARE FACTORS FREQUENCY  PT (By licensed PT), OT (By licensed OT)     PT Frequency: 5 x weekly OT Frequency: 5 x weekly            Contractures Contractures Info: Not present    Additional Factors Info  Code Status, Allergies Code Status Info: Full Allergies Info: Penicillins, Tape           Current Medications (09/13/2021):  This is the current hospital active medication list Current Facility-Administered Medications  Medication Dose Route Frequency Provider Last Rate Last Admin   0.9 %  sodium chloride infusion   Intravenous Continuous Irving Copas, PA-C   Stopped at 09/12/21 2133   acetaminophen (TYLENOL) tablet 325-650 mg  325-650 mg Oral Q6H PRN Irving Copas, PA-C   650 mg at 09/13/21 1024   albuterol (PROVENTIL) (2.5 MG/3ML) 0.083% nebulizer solution 3 mL  3 mL Inhalation BID PRN Irving Copas, PA-C       aspirin chewable tablet 81 mg  81 mg Oral BID Irving Copas, PA-C   81 mg at 09/13/21 1024   bisacodyl (DULCOLAX) suppository 10 mg  10 mg Rectal Daily PRN Irving Copas, PA-C       citalopram (CELEXA) tablet 20 mg  20 mg Oral Daily Irving Copas, PA-C   20 mg at 09/13/21 1024   diltiazem (  CARDIZEM CD) 24 hr capsule 120 mg  120 mg Oral Daily Irving Copas, PA-C   120 mg at 09/13/21 1024   diphenhydrAMINE (BENADRYL) 12.5 MG/5ML elixir 12.5-25 mg  12.5-25 mg Oral Q4H PRN Irving Copas, PA-C       docusate sodium (COLACE) capsule 100 mg  100 mg Oral BID Irving Copas, PA-C   100 mg at 09/13/21 1025   ferrous sulfate tablet 325 mg  325 mg Oral TID PC Irving Copas, PA-C   325 mg at 09/13/21 0900   fluticasone (FLONASE) 50 MCG/ACT nasal spray 2 spray  2 spray Each Nare Daily Irving Copas, PA-C   2 spray at 09/12/21 1028    HYDROcodone-acetaminophen (NORCO/VICODIN) 5-325 MG per tablet 1-2 tablet  1-2 tablet Oral Q4H PRN Irving Copas, PA-C   1 tablet at 09/12/21 2132   labetalol (NORMODYNE) injection 5 mg  5 mg Intravenous Q4H PRN Irving Copas, PA-C       levothyroxine (SYNTHROID) tablet 25 mcg  25 mcg Oral Q0600 Irving Copas, PA-C   25 mcg at 09/13/21 1610   lip balm (CARMEX) ointment   Topical PRN Irving Copas, PA-C   75 application at 96/04/54 2131   lisinopril (ZESTRIL) tablet 40 mg  40 mg Oral Daily Irving Copas, PA-C   40 mg at 09/13/21 1024   loratadine (CLARITIN) tablet 10 mg  10 mg Oral Daily Irving Copas, PA-C   10 mg at 09/13/21 1024   melatonin tablet 5 mg  5 mg Oral QHS Irving Copas, PA-C   5 mg at 09/12/21 2132   menthol-cetylpyridinium (CEPACOL) lozenge 3 mg  1 lozenge Oral PRN Irving Copas, PA-C       Or   phenol (CHLORASEPTIC) mouth spray 1 spray  1 spray Mouth/Throat PRN Irving Copas, PA-C       methocarbamol (ROBAXIN) tablet 500 mg  500 mg Oral Q6H PRN Costella Hatcher R, PA-C   500 mg at 09/13/21 0981   Or   methocarbamol (ROBAXIN) 500 mg in dextrose 5 % 50 mL IVPB  500 mg Intravenous Q6H PRN Irving Copas, PA-C   Stopped at 09/11/21 1900   metoCLOPramide (REGLAN) tablet 5-10 mg  5-10 mg Oral Q8H PRN Irving Copas, PA-C       Or   metoCLOPramide (REGLAN) injection 5-10 mg  5-10 mg Intravenous Q8H PRN Irving Copas, PA-C       mometasone-formoterol (DULERA) 200-5 MCG/ACT inhaler 2 puff  2 puff Inhalation BID Irving Copas, PA-C   2 puff at 09/13/21 0904   montelukast (SINGULAIR) tablet 10 mg  10 mg Oral QHS Irving Copas, PA-C   10 mg at 09/12/21 2132   morphine 2 MG/ML injection 0.5-1 mg  0.5-1 mg Intravenous Q2H PRN Irving Copas, PA-C       multivitamin with minerals tablet 1 tablet  1 tablet Oral Daily Irving Copas, PA-C   1 tablet at 09/13/21 1024   ondansetron (ZOFRAN) injection 4 mg  4 mg Intravenous Q6H PRN Costella Hatcher R, PA-C   4 mg at 09/11/21 1419   ondansetron (ZOFRAN) tablet 4 mg  4 mg Oral Q6H PRN Irving Copas, PA-C       Or   ondansetron [facility]) injection 4 mg  4 mg Intravenous Q6H PRN Irving Copas, PA-C       pantoprazole (PROTONIX) EC tablet 80 mg  80 mg Oral Daily Irving Copas, PA-C   80 mg at 09/13/21 1024   polyethylene glycol (MIRALAX / GLYCOLAX) packet 17 g  17 g Oral Daily PRN Costella Hatcher R, PA-C       polyethylene glycol (MIRALAX / GLYCOLAX) packet 17 g  17 g Oral BID Charlynne Cousins, MD   17 g at 09/13/21 1025   promethazine (PHENERGAN) 6.25 mg in sodium chloride 0.9 % 50 mL IVPB  6.25 mg Intravenous Q6H PRN Irving Copas, PA-C   Stopped at 09/11/21 0050   simvastatin (ZOCOR) tablet 10 mg  10 mg Oral QHS Irving Copas, PA-C   10 mg at 09/12/21 2132   venlafaxine XR (EFFEXOR-XR) 24 hr capsule 75 mg  75 mg Oral Q breakfast Irving Copas, PA-C   75 mg at 09/13/21 0900     Discharge Medications: Please see discharge summary for a list of discharge medications.  Relevant Imaging Results:  Relevant Lab Results:   Additional Information SS# [id number] Covid vaccinations x 2  Ixchel Duck, Marjie Skiff, RN

## 2021-09-14 DIAGNOSIS — M2550 Pain in unspecified joint: Secondary | ICD-10-CM | POA: Diagnosis not present

## 2021-09-14 DIAGNOSIS — E039 Hypothyroidism, unspecified: Secondary | ICD-10-CM | POA: Diagnosis not present

## 2021-09-14 DIAGNOSIS — Z79899 Other long term (current) drug therapy: Secondary | ICD-10-CM | POA: Diagnosis not present

## 2021-09-14 DIAGNOSIS — Z96641 Presence of right artificial hip joint: Secondary | ICD-10-CM | POA: Diagnosis not present

## 2021-09-14 DIAGNOSIS — J45909 Unspecified asthma, uncomplicated: Secondary | ICD-10-CM | POA: Diagnosis not present

## 2021-09-14 DIAGNOSIS — K5900 Constipation, unspecified: Secondary | ICD-10-CM | POA: Diagnosis not present

## 2021-09-14 DIAGNOSIS — Z7401 Bed confinement status: Secondary | ICD-10-CM | POA: Diagnosis not present

## 2021-09-14 DIAGNOSIS — F329 Major depressive disorder, single episode, unspecified: Secondary | ICD-10-CM | POA: Diagnosis not present

## 2021-09-14 DIAGNOSIS — M25551 Pain in right hip: Secondary | ICD-10-CM | POA: Diagnosis not present

## 2021-09-14 DIAGNOSIS — Z4789 Encounter for other orthopedic aftercare: Secondary | ICD-10-CM | POA: Diagnosis not present

## 2021-09-14 DIAGNOSIS — S72001D Fracture of unspecified part of neck of right femur, subsequent encounter for closed fracture with routine healing: Secondary | ICD-10-CM | POA: Diagnosis not present

## 2021-09-14 DIAGNOSIS — I10 Essential (primary) hypertension: Secondary | ICD-10-CM | POA: Diagnosis not present

## 2021-09-14 DIAGNOSIS — F322 Major depressive disorder, single episode, severe without psychotic features: Secondary | ICD-10-CM | POA: Diagnosis not present

## 2021-09-14 DIAGNOSIS — E785 Hyperlipidemia, unspecified: Secondary | ICD-10-CM | POA: Diagnosis not present

## 2021-09-14 DIAGNOSIS — K219 Gastro-esophageal reflux disease without esophagitis: Secondary | ICD-10-CM | POA: Diagnosis not present

## 2021-09-14 DIAGNOSIS — Z96642 Presence of left artificial hip joint: Secondary | ICD-10-CM | POA: Diagnosis not present

## 2021-09-14 DIAGNOSIS — E440 Moderate protein-calorie malnutrition: Secondary | ICD-10-CM | POA: Diagnosis not present

## 2021-09-14 DIAGNOSIS — Z01811 Encounter for preprocedural respiratory examination: Secondary | ICD-10-CM | POA: Diagnosis not present

## 2021-09-14 DIAGNOSIS — E782 Mixed hyperlipidemia: Secondary | ICD-10-CM | POA: Diagnosis not present

## 2021-09-14 DIAGNOSIS — I700 Atherosclerosis of aorta: Secondary | ICD-10-CM | POA: Diagnosis not present

## 2021-09-14 DIAGNOSIS — R54 Age-related physical debility: Secondary | ICD-10-CM | POA: Diagnosis not present

## 2021-09-14 DIAGNOSIS — S72001A Fracture of unspecified part of neck of right femur, initial encounter for closed fracture: Secondary | ICD-10-CM | POA: Diagnosis not present

## 2021-09-14 LAB — CBC
HCT: 31.4 % — ABNORMAL LOW (ref 36.0–46.0)
Hemoglobin: 10.3 g/dL — ABNORMAL LOW (ref 12.0–15.0)
MCH: 31.3 pg (ref 26.0–34.0)
MCHC: 32.8 g/dL (ref 30.0–36.0)
MCV: 95.4 fL (ref 80.0–100.0)
Platelets: 242 10*3/uL (ref 150–400)
RBC: 3.29 MIL/uL — ABNORMAL LOW (ref 3.87–5.11)
RDW: 13.9 % (ref 11.5–15.5)
WBC: 13.9 10*3/uL — ABNORMAL HIGH (ref 4.0–10.5)
nRBC: 0 % (ref 0.0–0.2)

## 2021-09-14 LAB — RESP PANEL BY RT-PCR (FLU A&B, COVID) ARPGX2
Influenza A by PCR: NEGATIVE
Influenza B by PCR: NEGATIVE
SARS Coronavirus 2 by RT PCR: NEGATIVE

## 2021-09-14 MED ORDER — BISACODYL 10 MG RE SUPP: 10.0000 mg | Freq: Every day | RECTAL | 0 refills | Status: DC | PRN

## 2021-09-14 MED ORDER — ASPIRIN 81 MG PO CHEW: 81.0000 mg | CHEWABLE_TABLET | Freq: Two times a day (BID) | ORAL | Status: DC

## 2021-09-14 MED ORDER — METHOCARBAMOL 500 MG PO TABS: 500.0000 mg | ORAL_TABLET | Freq: Four times a day (QID) | ORAL | 0 refills | Status: DC | PRN

## 2021-09-14 MED ORDER — TRAMADOL HCL 50 MG PO TABS: 50.0000 mg | ORAL_TABLET | Freq: Four times a day (QID) | ORAL | 0 refills | Status: AC | PRN

## 2021-09-14 NOTE — Progress Notes (Signed)
Occupational Therapy Treatment Patient Details Name: Valerie Decker MRN: [id number] DOB: 01-05-1931 Today's Date: 09/14/2021   History of present illness Valerie Decker is a 88 year old female who presented to ED on 1/23 with right hip pain.  Patient reports fall 1 week PTA. Sustained right femoral neck fracture. A/P right THA through direct anterior approach on 09/11/21. PMH: essential hypertension, hyperlipidemia, hypothyroidism, depression, GERD   OT comments  Patient progressing towards goals but still needs education and instruction for safe functional mobility. She required assistance for toileting and LB dressing. Husband reports he cannot provide physical assistance therefore recommending short term rehab at discharge.    Recommendations for follow up therapy are one component of a multi-disciplinary discharge planning process, led by the attending physician.  Recommendations may be updated based on patient status, additional functional criteria and insurance authorization.    Follow Up Recommendations  Skilled nursing-short term rehab (<3 hours/day)    Assistance Recommended at Discharge Frequent or constant Supervision/Assistance  Patient can return home with the following  A little help with walking and/or transfers;A little help with bathing/dressing/bathroom   Equipment Recommendations  BSC/3in1    Recommendations for Other Services      Precautions / Restrictions Precautions Precautions: Fall Restrictions Weight Bearing Restrictions: No RLE Weight Bearing: Weight bearing as tolerated       Mobility Bed Mobility                    Transfers                         Balance Overall balance assessment: No apparent balance deficits (not formally assessed) Sitting-balance support: No upper extremity supported, Feet supported Sitting balance-Leahy Scale: Good     Standing balance support: Reliant on assistive device for balance Standing  balance-Leahy Scale: Fair Standing balance comment: briefly able to take hands off of walker                           ADL either performed or assessed with clinical judgement   ADL Overall ADL's : Needs assistance/impaired     Grooming: Wash/dry face;Standing;Wash/dry hands;Supervision/safety Grooming Details (indicate cue type and reason): at sink to wash hands and feet             Lower Body Dressing: Moderate assistance;Sit to/from stand Lower Body Dressing Details (indicate cue type and reason): assistance required to doff soiled underwear and don clean. She was able to manage from knees to pull up over hips. Toilet Transfer: Minimal assistance;Ambulation;Rolling walker (2 wheels);Grab bars;Comfort height toilet Toilet Transfer Details (indicate cue type and reason): verbal cues for safety and hand placement with use of walker Toileting- Clothing Manipulation and Hygiene: Moderate assistance Toileting - Clothing Manipulation Details (indicate cue type and reason): assistance for wiping perianal area - patient able to wipe front safely. Some assistane for clothing management due to having one hand on walker predominantly.     Functional mobility during ADLs: Min guard;Rolling walker (2 wheels) General ADL Comments: Min assist for RLE to transfer to side of bed and overall min guard with RW with verbal cues for proper and safe techniques. Patient performed toileting and standing at sink for grooming. Required mod assist for LB dressing due to pain limiting reaching feet. Demonstrated improving activity tolerance but still needs education and instruction for safe ambulation with walker and transfers with ADLs.    Extremity/Trunk Assessment  Vision       Perception     Praxis      Cognition Arousal/Alertness: Awake/alert Behavior During Therapy: WFL for tasks assessed/performed Overall Cognitive Status: Within Functional Limits for tasks  assessed                                          Exercises      Shoulder Instructions       General Comments      Pertinent Vitals/ Pain       Pain Assessment Pain Assessment: 0-10 Pain Score: 4  Pain Location: right hip and groin Pain Descriptors / Indicators: Sore Pain Intervention(s): Limited activity within patient's tolerance  Home Living                                          Prior Functioning/Environment              Frequency  Min 2X/week        Progress Toward Goals  OT Goals(current goals can now be found in the care plan section)  Progress towards OT goals: Progressing toward goals  Acute Rehab OT Goals Patient Stated Goal: be able to care for herself OT Goal Formulation: With patient Time For Goal Achievement: 09/26/21 Potential to Achieve Goals: Good  Plan Discharge plan needs to be updated    Co-evaluation          OT goals addressed during session: ADL's and self-care      AM-PAC OT "6 Clicks" Daily Activity     Outcome Measure   Help from another person eating meals?: None Help from another person taking care of personal grooming?: A Little Help from another person toileting, which includes using toliet, bedpan, or urinal?: A Lot Help from another person bathing (including washing, rinsing, drying)?: A Little Help from another person to put on and taking off regular upper body clothing?: A Little Help from another person to put on and taking off regular lower body clothing?: A Lot 6 Click Score: 17    End of Session Equipment Utilized During Treatment: Gait belt;Rolling walker (2 wheels)  OT Visit Diagnosis: Unsteadiness on feet (R26.81);Other abnormalities of gait and mobility (R26.89);History of falling (Z91.81);Pain   Activity Tolerance Patient tolerated treatment well   Patient Left in chair;with call bell/phone within reach;with family/visitor present;with chair alarm set   Nurse  Communication Mobility status        Time: [phone] OT Time Calculation (min): 18 min  Charges: OT General Charges $OT Visit: 1 Visit OT Treatments $Self Care/Home Management : 8-22 mins  Derl Barrow, OTR/[facility] [place]  Office [phone] Pager: [place] 09/14/2021, 8:46 AM

## 2021-09-14 NOTE — Discharge Summary (Signed)
Physician Discharge Summary  Valerie Decker DUK:[id number] DOB: Oct 13, 1930 DOA: 09/10/2021  PCP: Maurice Small, MD  Admit date: 09/10/2021 Discharge date: 09/14/2021  Admitted From: Home Disposition:  SNF  Recommendations for Outpatient Follow-up:  Follow up with PCP in 1-2 weeks Please obtain BMP/CBC in one week   Home Health:No Equipment/Devices:None  Discharge Condition:Stable CODE STATUS: Full Diet recommendation: Heart Healthy Brief/Interim Summary: 88 y.o. female past medical history significant for essential hypertension, hyperlipidemia GERD comes into the ED on 09/10/2020 after right hip pain patient reports a fall 1 week prior to admission right hip x-ray showed no dislocation, CT of the pelvis with contrast showed nondisplaced impacted right femoral neck fracture orthopedic surgery was consulted patient was transferred to [facility], surgical intervention was performed 09/11/2021  Discharge Diagnoses:  Principal Problem:   Closed right hip fracture, initial encounter [name]) Active Problems:   Hypothyroidism   HYPERLIPIDEMIA   HTN (hypertension)   Asthma   Malnutrition of moderate degree   S/P total right hip arthroplasty  Closed right hip fracture: Orthopedic surgery was consulted to perform surgical intervention 09/11/2021 total hip arthroplasty. Physical therapy evaluated the patient, she will be going to skilled nursing facility. Her pain was controlled with tramadol and Robaxin.  Hypothyroidism: No changes made continue Synthroid.  Depression/anxiety: Continue Celexa and Effexor.  Hyperlipidemia: Sign continue statins.   Essential hypertension: No changes made continue Cardizem and lisinopril.  Asthma: No changes made  Discharge Instructions  Discharge Instructions     Diet - low sodium heart healthy   Complete by: As directed    Increase activity slowly   Complete by: As directed    No wound care   Complete by: As directed        Allergies as of 09/14/2021       Reactions   Penicillins Diarrhea, Nausea And Vomiting   Tape Rash   Red skin after tape removed after surgery        Medication List     STOP taking these medications    ibuprofen 200 MG tablet Commonly known as: ADVIL       TAKE these medications    acetaminophen 500 MG tablet Commonly known as: TYLENOL Take 1,000 mg by mouth every 6 (six) hours as needed for moderate pain or headache.   albuterol 108 (90 Base) MCG/ACT inhaler Commonly known as: VENTOLIN HFA Inhale 1 puff into the lungs 2 (two) times daily as needed for wheezing or shortness of breath. What changed: Another medication with the same name was removed. Continue taking this medication, and follow the directions you see here.   aspirin 81 MG chewable tablet Chew 1 tablet (81 mg total) by mouth 2 (two) times daily.   bisacodyl 10 MG suppository Commonly known as: DULCOLAX Place 1 suppository (10 mg total) rectally daily as needed for moderate constipation.   budesonide-formoterol 160-4.5 MCG/ACT inhaler Commonly known as: SYMBICORT Inhale 1-2 puffs into the lungs 2 (two) times daily.   CALCIUM CITRATE-VITAMIN D PO Take 2 tablets by mouth daily. Calcium citrate 400 iu calcium/630mg  vitamin d   citalopram 20 MG tablet Commonly known as: CELEXA Take 20 mg by mouth daily.   CITRUCEL PO Take 1 Scoop by mouth daily.   Co Q 10 60 MG Caps Take 60 mg by mouth daily.   diltiazem 120 MG 24 hr capsule Commonly known as: CARDIZEM CD 120 mg daily.   fluticasone 50 MCG/ACT nasal spray Commonly known as: FLONASE Place 2 sprays into the  nose daily.   levocetirizine 5 MG tablet Commonly known as: XYZAL Take 5 mg by mouth every evening.   levothyroxine 25 MCG tablet Commonly known as: SYNTHROID TAKE 1 TABLET ONCE DAILY. What changed: when to take this   lisinopril 40 MG tablet Commonly known as: ZESTRIL Take 40 mg by mouth daily.   Melatonin 5 MG Chew Chew 5 mg  by mouth at bedtime.   methocarbamol 500 MG tablet Commonly known as: ROBAXIN Take 1 tablet (500 mg total) by mouth every 6 (six) hours as needed for muscle spasms.   montelukast 10 MG tablet Commonly known as: SINGULAIR Take 10 mg by mouth at bedtime.   omeprazole 40 MG capsule Commonly known as: PRILOSEC Take 40 mg by mouth daily before breakfast.   QC TUMERIC COMPLEX PO Take 750 mg by mouth daily.   simvastatin 10 MG tablet Commonly known as: ZOCOR TAKE ONE TABLET AT BEDTIME.   traMADol 50 MG tablet Commonly known as: Ultram Take 1 tablet (50 mg total) by mouth every 6 (six) hours as needed for up to 5 days.   venlafaxine XR 75 MG 24 hr capsule Commonly known as: EFFEXOR-XR Take 75 mg by mouth daily with breakfast.   Vitamin D-3 125 MCG (5000 UT) Tabs Take 5,000 Units by mouth daily.        Contact information for follow-up providers     Paralee Cancel, MD. Schedule an appointment as soon as possible for a visit in 2 week(s).   Specialty: Orthopedic Surgery Contact information: [address] [place] [address] [phone]              Contact information for after-discharge care     Destination     [facility] Preferred SNF .   Service: Skilled Nursing Contact information: [place] [address] [phone]                    Allergies  Allergen Reactions   Penicillins Diarrhea and Nausea And Vomiting   Tape Rash    Red skin after tape removed after surgery    Consultations: Orthopedic surgery   Procedures/Studies: CT PELVIS WO CONTRAST  Result Date: 09/10/2021 CLINICAL DATA:  Pelvic trauma. EXAM: CT PELVIS WITHOUT CONTRAST TECHNIQUE: Multidetector CT imaging of the pelvis was performed following the standard protocol without intravenous contrast. RADIATION DOSE REDUCTION: This exam was performed according to the departmental dose-optimization program which  includes automated exposure control, adjustment of the mA and/or kV according to patient size and/or use of iterative reconstruction technique. COMPARISON:  Right hip radiograph dated 09/10/2021. FINDINGS: Evaluation of this exam is limited in the absence of intravenous contrast. Urinary Tract:  No abnormality visualized. Bowel:  Unremarkable visualized pelvic bowel loops. Vascular/Lymphatic: Moderate atherosclerotic calcification. The visualized vasculature is otherwise unremarkable. No adenopathy within the pelvis. Reproductive:  The uterus and ovaries are grossly unremarkable. Other:  None Musculoskeletal: Nondisplaced, impacted right femoral neck fracture. No dislocation. The bones are osteopenic. Degenerative changes of the lower lumbar spine. IMPRESSION: Nondisplaced, impacted right femoral neck fracture. Electronically Signed   By: Anner Crete M.D.   On: 09/10/2021 21:55   DG Pelvis Portable  Result Date: 09/11/2021 CLINICAL DATA:  Right hip arthroplasty EXAM: PORTABLE PELVIS 1-2 VIEWS COMPARISON:  CT done on 09/10/2021 FINDINGS: Impacted fracture was seen in the subcapital portion of neck of right femur in the previous CT. There is evidence of interval right hip arthroplasty. IMPRESSION: Right hip  arthroplasty. Electronically Signed   By: Elmer Picker M.D.   On: 09/11/2021 18:27   Chest Portable 1 View  Result Date: 09/10/2021 CLINICAL DATA:  Preop EXAM: PORTABLE CHEST 1 VIEW COMPARISON:  08/15/2020 FINDINGS: Heart and mediastinal contours are within normal limits. No focal opacities or effusions. No acute bony abnormality. IMPRESSION: No active disease. Electronically Signed   By: Rolm Baptise M.D.   On: 09/10/2021 23:19   DG C-Arm 1-60 Min-No Report  Result Date: 09/11/2021 Fluoroscopy was utilized by the requesting physician.  No radiographic interpretation.   DG HIP OPERATIVE UNILAT W OR W/O PELVIS RIGHT  Result Date: 09/11/2021 CLINICAL DATA:  Right hip arthroplasty EXAM:  OPERATIVE right HIP (WITH PELVIS IF PERFORMED) AP VIEWS TECHNIQUE: Fluoroscopic spot image(s) were submitted for interpretation post-operatively. COMPARISON:  09/10/2021 FINDINGS: There is interval right hip arthroplasty. IMPRESSION: Right hip arthroplasty. Electronically Signed   By: Elmer Picker M.D.   On: 09/11/2021 18:26   DG Hip Unilat W or Wo Pelvis 2-3 Views Right  Result Date: 09/10/2021 CLINICAL DATA:  Right hip pain since a fall 7 days ago. EXAM: DG HIP (WITH OR WITHOUT PELVIS) 2-3V RIGHT COMPARISON:  None. FINDINGS: Moderate to marked femoral head and neck junction spur formation with mild joint space narrowing. No fracture or dislocation seen. Lower lumbar spine degenerative changes. Atheromatous arterial calcifications. Mildly prominent stool in the colon. IMPRESSION: 1. No fracture or dislocation. 2. Moderate right hip degenerative changes. 3. Lower lumbar spine degenerative changes. Electronically Signed   By: Claudie Revering M.D.   On: 09/10/2021 12:30      Subjective: No complaints  Discharge Exam: Vitals:   09/14/21 0509 09/14/21 0747  BP: 104/84   Pulse: 89   Resp: 18   Temp: 97.7 F (36.5 C)   SpO2: 99% 98%   Vitals:   09/13/21 1454 09/13/21 2030 09/14/21 0509 09/14/21 0747  BP: 102/79 (!) 141/74 104/84   Pulse: 92 92 89   Resp: 17  18   Temp: 97.7 F (36.5 C) 99.9 F (37.7 C) 97.7 F (36.5 C)   TempSrc: Oral Oral Oral   SpO2: 94% 97% 99% 98%  Weight:      Height:        General: Pt is alert, awake, not in acute distress Cardiovascular: RRR, S1/S2 +, no rubs, no gallops Respiratory: CTA bilaterally, no wheezing, no rhonchi Abdominal: Soft, NT, ND, bowel sounds + Extremities: no edema, no cyanosis    The results of significant diagnostics from this hospitalization (including imaging, microbiology, ancillary and laboratory) are listed below for reference.     Microbiology: Recent Results (from the past 240 hour(s))  Resp Panel by RT-PCR (Flu A&B,  Covid) Nasopharyngeal Swab     Status: None   Collection Time: 09/10/21 10:27 PM   Specimen: Nasopharyngeal Swab; Nasopharyngeal(NP) swabs in vial transport medium  Result Value Ref Range Status   SARS Coronavirus 2 by RT PCR NEGATIVE NEGATIVE Final    Comment: (NOTE) SARS-CoV-2 target nucleic acids are NOT DETECTED.  The SARS-CoV-2 RNA is generally detectable in upper respiratory specimens during the acute phase of infection. The lowest concentration of SARS-CoV-2 viral copies this assay can detect is 138 copies/mL. A negative result does not preclude SARS-Cov-2 infection and should not be used as the sole basis for treatment or other patient management decisions. A negative result may occur with  improper specimen collection/handling, submission of specimen other than nasopharyngeal swab, presence of viral mutation(s) within the areas targeted  by this assay, and inadequate number of viral copies(<138 copies/mL). A negative result must be combined with clinical observations, patient history, and epidemiological information. The expected result is Negative.  Fact Sheet for Patients:  EntrepreneurPulse.com.au  Fact Sheet for Healthcare Providers:  IncredibleEmployment.be  This test is no t yet approved or cleared by the [place] FDA and  has been authorized for detection and/or diagnosis of SARS-CoV-2 by FDA under an Emergency Use Authorization (EUA). This EUA will remain  in effect (meaning this test can be used) for the duration of the COVID-19 declaration under Section 564(b)(1) of the Act, 21 U.S.C.section 360bbb-3(b)(1), unless the authorization is terminated  or revoked sooner.       Influenza A by PCR NEGATIVE NEGATIVE Final   Influenza B by PCR NEGATIVE NEGATIVE Final    Comment: (NOTE) The Xpert Xpress SARS-CoV-2/FLU/RSV plus assay is intended as an aid in the diagnosis of influenza from Nasopharyngeal swab specimens and should  not be used as a sole basis for treatment. Nasal washings and aspirates are unacceptable for Xpert Xpress SARS-CoV-2/FLU/RSV testing.  Fact Sheet for Patients: EntrepreneurPulse.com.au  Fact Sheet for Healthcare Providers: IncredibleEmployment.be  This test is not yet approved or cleared by the [place] FDA and has been authorized for detection and/or diagnosis of SARS-CoV-2 by FDA under an Emergency Use Authorization (EUA). This EUA will remain in effect (meaning this test can be used) for the duration of the COVID-19 declaration under Section 564(b)(1) of the Act, 21 U.S.C. section 360bbb-3(b)(1), unless the authorization is terminated or revoked.  Performed at [facility], [place] [address] [place], [place] [address]   Surgical PCR screen     Status: None   Collection Time: 09/11/21  1:20 AM   Specimen: Nasal Mucosa; Nasal Swab  Result Value Ref Range Status   MRSA, PCR NEGATIVE NEGATIVE Final   Staphylococcus aureus NEGATIVE NEGATIVE Final    Comment: (NOTE) The Xpert SA Assay (FDA approved for NASAL specimens in patients 98 years of age and older), is one component of a comprehensive surveillance program. It is not intended to diagnose infection nor to guide or monitor treatment. Performed at [facility], [place] [address] [place], [place] [address]      Labs: BNP (last 3 results) No results for input(s): BNP in the last 8760 hours. Basic Metabolic Panel: Recent Labs  Lab 09/10/21 1946 09/10/21 2223 09/12/21 0512  NA 135 133* 133*  K 4.3 4.2 4.2  CL 99 100 102  CO2  --  21* 20*  GLUCOSE 120* 86 172*  BUN 23 16 18   CREATININE 0.70 0.77 0.78  CALCIUM  --  8.6* 8.1*   Liver Function Tests: No results for input(s): AST, ALT, ALKPHOS, BILITOT, PROT, ALBUMIN in the last 168 hours. No results for input(s): LIPASE, AMYLASE in the last 168 hours. No results for input(s): AMMONIA in the last 168  hours. CBC: Recent Labs  Lab 09/10/21 1946 09/10/21 2223 09/12/21 0512 09/13/21 0451 09/14/21 0521  WBC  --  8.9 12.0* 15.9* 13.9*  NEUTROABS  --  6.3  --   --   --   HGB 15.6* 14.1 12.3 10.6* 10.3*  HCT 46.0 41.3 37.3 31.7* 31.4*  MCV  --  95.8 96.6 95.2 95.4  PLT  --  282 256 241 242   Cardiac Enzymes: No results for input(s): CKTOTAL, CKMB, CKMBINDEX, TROPONINI in the last 168 hours. BNP: Invalid input(s): POCBNP CBG: No results for input(s): GLUCAP  in the last 168 hours. D-Dimer No results for input(s): DDIMER in the last 72 hours. Hgb A1c No results for input(s): HGBA1C in the last 72 hours. Lipid Profile No results for input(s): CHOL, HDL, LDLCALC, TRIG, CHOLHDL, LDLDIRECT in the last 72 hours. Thyroid function studies No results for input(s): TSH, T4TOTAL, T3FREE, THYROIDAB in the last 72 hours.  Invalid input(s): FREET3 Anemia work up No results for input(s): VITAMINB12, FOLATE, FERRITIN, TIBC, IRON, RETICCTPCT in the last 72 hours. Urinalysis    Component Value Date/Time   COLORURINE YELLOW 08/07/2020 0240   APPEARANCEUR CLEAR 08/07/2020 0240   LABSPEC 1.013 08/07/2020 0240   PHURINE 5.0 08/07/2020 0240   GLUCOSEU NEGATIVE 08/07/2020 0240   HGBUR NEGATIVE 08/07/2020 0240   BILIRUBINUR NEGATIVE 08/07/2020 0240   BILIRUBINUR Neg 02/27/2012 1544   KETONESUR 20 (A) 08/07/2020 0240   PROTEINUR NEGATIVE 08/07/2020 0240   UROBILINOGEN 0.2 02/27/2012 1544   UROBILINOGEN 0.2 08/19/2011 0749   NITRITE NEGATIVE 08/07/2020 0240   LEUKOCYTESUR NEGATIVE 08/07/2020 0240   Sepsis Labs Invalid input(s): PROCALCITONIN,  WBC,  LACTICIDVEN Microbiology Recent Results (from the past 240 hour(s))  Resp Panel by RT-PCR (Flu A&B, Covid) Nasopharyngeal Swab     Status: None   Collection Time: 09/10/21 10:27 PM   Specimen: Nasopharyngeal Swab; Nasopharyngeal(NP) swabs in vial transport medium  Result Value Ref Range Status   SARS Coronavirus 2 by RT PCR NEGATIVE NEGATIVE  Final    Comment: (NOTE) SARS-CoV-2 target nucleic acids are NOT DETECTED.  The SARS-CoV-2 RNA is generally detectable in upper respiratory specimens during the acute phase of infection. The lowest concentration of SARS-CoV-2 viral copies this assay can detect is 138 copies/mL. A negative result does not preclude SARS-Cov-2 infection and should not be used as the sole basis for treatment or other patient management decisions. A negative result may occur with  improper specimen collection/handling, submission of specimen other than nasopharyngeal swab, presence of viral mutation(s) within the areas targeted by this assay, and inadequate number of viral copies(<138 copies/mL). A negative result must be combined with clinical observations, patient history, and epidemiological information. The expected result is Negative.  Fact Sheet for Patients:  EntrepreneurPulse.com.au  Fact Sheet for Healthcare Providers:  IncredibleEmployment.be  This test is no t yet approved or cleared by the [place] FDA and  has been authorized for detection and/or diagnosis of SARS-CoV-2 by FDA under an Emergency Use Authorization (EUA). This EUA will remain  in effect (meaning this test can be used) for the duration of the COVID-19 declaration under Section 564(b)(1) of the Act, 21 U.S.C.section 360bbb-3(b)(1), unless the authorization is terminated  or revoked sooner.       Influenza A by PCR NEGATIVE NEGATIVE Final   Influenza B by PCR NEGATIVE NEGATIVE Final    Comment: (NOTE) The Xpert Xpress SARS-CoV-2/FLU/RSV plus assay is intended as an aid in the diagnosis of influenza from Nasopharyngeal swab specimens and should not be used as a sole basis for treatment. Nasal washings and aspirates are unacceptable for Xpert Xpress SARS-CoV-2/FLU/RSV testing.  Fact Sheet for Patients: EntrepreneurPulse.com.au  Fact Sheet for Healthcare  Providers: IncredibleEmployment.be  This test is not yet approved or cleared by the [place] FDA and has been authorized for detection and/or diagnosis of SARS-CoV-2 by FDA under an Emergency Use Authorization (EUA). This EUA will remain in effect (meaning this test can be used) for the duration of the COVID-19 declaration under Section 564(b)(1) of the Act, 21 U.S.C. section 360bbb-3(b)(1), unless the  authorization is terminated or revoked.  Performed at [facility], [place] [address] [place], [place] [address]   Surgical PCR screen     Status: None   Collection Time: 09/11/21  1:20 AM   Specimen: Nasal Mucosa; Nasal Swab  Result Value Ref Range Status   MRSA, PCR NEGATIVE NEGATIVE Final   Staphylococcus aureus NEGATIVE NEGATIVE Final    Comment: (NOTE) The Xpert SA Assay (FDA approved for NASAL specimens in patients 63 years of age and older), is one component of a comprehensive surveillance program. It is not intended to diagnose infection nor to guide or monitor treatment. Performed at [facility], [place] [address], [place], [place] [address]    SIGNED:   Charlynne Cousins, MD  [facility] 09/14/2021, 9:56 AM Pager   If 7PM-7AM, please contact night-coverage [email or web address] Password [email or web address]

## 2021-09-14 NOTE — TOC Transition Note (Signed)
Transition of Care [facility]) - CM/SW Discharge Note   Patient Details  Name: Neka Bise MRN: [id number] Date of Birth: Jan 05, 1931  Transition of [facility] CM/SW Contact:  Lynnell Catalan, RN Phone Number: 09/14/2021, 1:28 PM   Clinical Narrative:    [facility] has received insurance auth for SNF. She will go to [facility] via [facility] today. [facility] contacted for transport. RN to call report to [phone].   Readmission Risk Interventions No flowsheet data found.

## 2021-09-14 NOTE — Progress Notes (Signed)
PT Cancellation Note  Patient Details Name: [name] MRN: [id number] DOB: [birth year]   Cancelled Treatment:    Reason Eval/Treat Not Completed: Patient declined, no reason specified. Pt reports she is d/c to SNF between 3-5pm today and would prefer to rest now before she leaves. Pt reports she has ambulated to restroom with nursing 2x today.    Talbot Grumbling PT, DPT 09/14/21, 12:36 PM

## 2021-09-16 DIAGNOSIS — F322 Major depressive disorder, single episode, severe without psychotic features: Secondary | ICD-10-CM | POA: Diagnosis not present

## 2021-09-16 DIAGNOSIS — I700 Atherosclerosis of aorta: Secondary | ICD-10-CM | POA: Diagnosis not present

## 2021-09-16 DIAGNOSIS — S72001A Fracture of unspecified part of neck of right femur, initial encounter for closed fracture: Secondary | ICD-10-CM | POA: Diagnosis not present

## 2021-09-16 DIAGNOSIS — E785 Hyperlipidemia, unspecified: Secondary | ICD-10-CM | POA: Diagnosis not present

## 2021-09-18 DIAGNOSIS — R54 Age-related physical debility: Secondary | ICD-10-CM | POA: Diagnosis not present

## 2021-09-18 DIAGNOSIS — K219 Gastro-esophageal reflux disease without esophagitis: Secondary | ICD-10-CM | POA: Diagnosis not present

## 2021-09-18 DIAGNOSIS — S72001D Fracture of unspecified part of neck of right femur, subsequent encounter for closed fracture with routine healing: Secondary | ICD-10-CM | POA: Diagnosis not present

## 2021-09-18 DIAGNOSIS — F329 Major depressive disorder, single episode, unspecified: Secondary | ICD-10-CM | POA: Diagnosis not present

## 2021-09-18 DIAGNOSIS — I10 Essential (primary) hypertension: Secondary | ICD-10-CM | POA: Diagnosis not present

## 2021-09-18 DIAGNOSIS — E039 Hypothyroidism, unspecified: Secondary | ICD-10-CM | POA: Diagnosis not present

## 2021-09-18 DIAGNOSIS — J45909 Unspecified asthma, uncomplicated: Secondary | ICD-10-CM | POA: Diagnosis not present

## 2021-09-18 DIAGNOSIS — K5900 Constipation, unspecified: Secondary | ICD-10-CM | POA: Diagnosis not present

## 2021-09-30 DIAGNOSIS — S72001D Fracture of unspecified part of neck of right femur, subsequent encounter for closed fracture with routine healing: Secondary | ICD-10-CM | POA: Diagnosis not present

## 2021-09-30 DIAGNOSIS — E039 Hypothyroidism, unspecified: Secondary | ICD-10-CM | POA: Diagnosis not present

## 2021-09-30 DIAGNOSIS — E440 Moderate protein-calorie malnutrition: Secondary | ICD-10-CM | POA: Diagnosis not present

## 2021-09-30 DIAGNOSIS — F32A Depression, unspecified: Secondary | ICD-10-CM | POA: Diagnosis not present

## 2021-09-30 DIAGNOSIS — F419 Anxiety disorder, unspecified: Secondary | ICD-10-CM | POA: Diagnosis not present

## 2021-09-30 DIAGNOSIS — K219 Gastro-esophageal reflux disease without esophagitis: Secondary | ICD-10-CM | POA: Diagnosis not present

## 2021-09-30 DIAGNOSIS — I700 Atherosclerosis of aorta: Secondary | ICD-10-CM | POA: Diagnosis not present

## 2021-09-30 DIAGNOSIS — J45909 Unspecified asthma, uncomplicated: Secondary | ICD-10-CM | POA: Diagnosis not present

## 2021-09-30 DIAGNOSIS — I10 Essential (primary) hypertension: Secondary | ICD-10-CM | POA: Diagnosis not present

## 2021-10-09 DIAGNOSIS — Z79899 Other long term (current) drug therapy: Secondary | ICD-10-CM | POA: Diagnosis not present

## 2021-10-09 DIAGNOSIS — S72001D Fracture of unspecified part of neck of right femur, subsequent encounter for closed fracture with routine healing: Secondary | ICD-10-CM | POA: Diagnosis not present

## 2021-10-18 DIAGNOSIS — I10 Essential (primary) hypertension: Secondary | ICD-10-CM | POA: Diagnosis not present

## 2021-10-18 DIAGNOSIS — F330 Major depressive disorder, recurrent, mild: Secondary | ICD-10-CM | POA: Diagnosis not present

## 2021-10-23 DIAGNOSIS — E039 Hypothyroidism, unspecified: Secondary | ICD-10-CM | POA: Diagnosis not present

## 2021-10-23 DIAGNOSIS — R42 Dizziness and giddiness: Secondary | ICD-10-CM | POA: Diagnosis not present

## 2021-10-23 DIAGNOSIS — I10 Essential (primary) hypertension: Secondary | ICD-10-CM | POA: Diagnosis not present

## 2021-10-23 DIAGNOSIS — F330 Major depressive disorder, recurrent, mild: Secondary | ICD-10-CM | POA: Diagnosis not present

## 2021-10-30 ENCOUNTER — Other Ambulatory Visit: Payer: Self-pay | Admitting: Family Medicine

## 2021-10-30 DIAGNOSIS — E2839 Other primary ovarian failure: Secondary | ICD-10-CM

## 2021-10-31 DIAGNOSIS — Z96641 Presence of right artificial hip joint: Secondary | ICD-10-CM | POA: Diagnosis not present

## 2021-10-31 DIAGNOSIS — Z471 Aftercare following joint replacement surgery: Secondary | ICD-10-CM | POA: Diagnosis not present

## 2021-11-16 DIAGNOSIS — K219 Gastro-esophageal reflux disease without esophagitis: Secondary | ICD-10-CM | POA: Diagnosis not present

## 2021-11-16 DIAGNOSIS — J3089 Other allergic rhinitis: Secondary | ICD-10-CM | POA: Diagnosis not present

## 2021-11-16 DIAGNOSIS — H1045 Other chronic allergic conjunctivitis: Secondary | ICD-10-CM | POA: Diagnosis not present

## 2021-11-16 DIAGNOSIS — J4530 Mild persistent asthma, uncomplicated: Secondary | ICD-10-CM | POA: Diagnosis not present

## 2021-11-19 DIAGNOSIS — F330 Major depressive disorder, recurrent, mild: Secondary | ICD-10-CM | POA: Diagnosis not present

## 2022-02-20 ENCOUNTER — Ambulatory Visit: Payer: Medicare PPO | Admitting: Orthopaedic Surgery

## 2022-03-22 DIAGNOSIS — J479 Bronchiectasis, uncomplicated: Secondary | ICD-10-CM | POA: Diagnosis not present

## 2022-03-22 DIAGNOSIS — J45901 Unspecified asthma with (acute) exacerbation: Secondary | ICD-10-CM | POA: Diagnosis not present

## 2022-03-29 DIAGNOSIS — E785 Hyperlipidemia, unspecified: Secondary | ICD-10-CM | POA: Diagnosis not present

## 2022-03-29 DIAGNOSIS — F419 Anxiety disorder, unspecified: Secondary | ICD-10-CM | POA: Diagnosis not present

## 2022-03-29 DIAGNOSIS — E039 Hypothyroidism, unspecified: Secondary | ICD-10-CM | POA: Diagnosis not present

## 2022-03-29 DIAGNOSIS — K5900 Constipation, unspecified: Secondary | ICD-10-CM | POA: Diagnosis not present

## 2022-03-29 DIAGNOSIS — J449 Chronic obstructive pulmonary disease, unspecified: Secondary | ICD-10-CM | POA: Diagnosis not present

## 2022-03-29 DIAGNOSIS — F325 Major depressive disorder, single episode, in full remission: Secondary | ICD-10-CM | POA: Diagnosis not present

## 2022-03-29 DIAGNOSIS — I10 Essential (primary) hypertension: Secondary | ICD-10-CM | POA: Diagnosis not present

## 2022-03-29 DIAGNOSIS — J309 Allergic rhinitis, unspecified: Secondary | ICD-10-CM | POA: Diagnosis not present

## 2022-03-29 DIAGNOSIS — K219 Gastro-esophageal reflux disease without esophagitis: Secondary | ICD-10-CM | POA: Diagnosis not present

## 2022-04-04 ENCOUNTER — Other Ambulatory Visit: Payer: Self-pay | Admitting: *Deleted

## 2022-04-04 NOTE — Patient Outreach (Signed)
  Care Coordination   Initial Visit Note   04/04/2022 Name: Alina Gilkey MRN: [id number] DOB: 1931-04-01  Izzie Geers is a 88 y.o. year old female who sees Maurice Small, MD for primary care. I spoke with  Maylon Cos and her spouse (Ed) with pt's permission by phone today  What matters to the patients health and wellness today?  No needs    Goals Addressed               This Visit's Progress     No needs (pt-stated)        Care Coordination Interventions: Reviewed scheduled/upcoming provider appointments including Pending appointments. Verified AWV is on 05/03/2022 with Dr. Stephanie Acre. Screening for signs and symptoms of depression related to chronic disease state  Assessed social determinant of health barriers       Patient Stated          SDOH assessments and interventions completed:  Yes  SDOH Interventions Today    Flowsheet Row Most Recent Value  SDOH Interventions   Food Insecurity Interventions Intervention Not Indicated  Housing Interventions Intervention Not Indicated  Transportation Interventions Intervention Not Indicated        Care Coordination Interventions Activated:  Yes  Care Coordination Interventions:  Yes, provided   Follow up plan: No further intervention required.   Encounter Outcome:  Pt. Visit Completed   Raina Mina, RN Care Management Coordinator [place] Office [phone]

## 2022-04-04 NOTE — Patient Instructions (Signed)
Visit Information  Thank you for taking time to visit with me today. Please don't hesitate to contact me if I can be of assistance to you.   Following are the goals we discussed today:   Goals Addressed               This Visit's Progress     No needs (pt-stated)        Care Coordination Interventions: Reviewed scheduled/upcoming provider appointments including Pending appointments. Verified AWV is on 05/03/2022 with Dr. Stephanie Acre. Screening for signs and symptoms of depression related to chronic disease state  Assessed social determinant of health barriers       Patient Stated           Please call the care guide team at [phone] if you need to cancel or reschedule your appointment.   If you are experiencing a Mental Health or [place] or need someone to talk to, please call the Suicide and Crisis Lifeline: 988  Patient verbalizes understanding of instructions and care plan provided today and agrees to view in [place]. Active MyChart status and patient understanding of how to access instructions and care plan via MyChart confirmed with patient.     No further follow up required: No additional needs at this time.  Raina Mina, RN Care Management Coordinator [place] Office [phone]

## 2022-04-09 ENCOUNTER — Other Ambulatory Visit: Payer: Self-pay | Admitting: Family Medicine

## 2022-04-09 ENCOUNTER — Ambulatory Visit
Admission: RE | Admit: 2022-04-09 | Discharge: 2022-04-09 | Disposition: A | Discharge status: Home / Self Care | Payer: Medicare PPO | Source: Ambulatory Visit | Attending: Family Medicine | Admitting: Family Medicine

## 2022-04-09 DIAGNOSIS — R059 Cough, unspecified: Secondary | ICD-10-CM | POA: Diagnosis not present

## 2022-04-09 DIAGNOSIS — R058 Other specified cough: Secondary | ICD-10-CM

## 2022-04-12 ENCOUNTER — Ambulatory Visit
Admission: RE | Admit: 2022-04-12 | Discharge: 2022-04-12 | Disposition: A | Discharge status: Home / Self Care | Payer: Medicare PPO | Source: Ambulatory Visit | Attending: Family Medicine | Admitting: Family Medicine

## 2022-04-12 DIAGNOSIS — M810 Age-related osteoporosis without current pathological fracture: Secondary | ICD-10-CM | POA: Diagnosis not present

## 2022-04-12 DIAGNOSIS — E2839 Other primary ovarian failure: Secondary | ICD-10-CM

## 2022-04-12 DIAGNOSIS — Z780 Asymptomatic menopausal state: Secondary | ICD-10-CM | POA: Diagnosis not present

## 2022-04-12 DIAGNOSIS — M85852 Other specified disorders of bone density and structure, left thigh: Secondary | ICD-10-CM | POA: Diagnosis not present

## 2022-04-16 DIAGNOSIS — Z85820 Personal history of malignant melanoma of skin: Secondary | ICD-10-CM | POA: Diagnosis not present

## 2022-04-16 DIAGNOSIS — L817 Pigmented purpuric dermatosis: Secondary | ICD-10-CM | POA: Diagnosis not present

## 2022-04-16 DIAGNOSIS — D225 Melanocytic nevi of trunk: Secondary | ICD-10-CM | POA: Diagnosis not present

## 2022-04-16 DIAGNOSIS — Z85828 Personal history of other malignant neoplasm of skin: Secondary | ICD-10-CM | POA: Diagnosis not present

## 2022-04-16 DIAGNOSIS — L723 Sebaceous cyst: Secondary | ICD-10-CM | POA: Diagnosis not present

## 2022-04-17 DIAGNOSIS — J471 Bronchiectasis with (acute) exacerbation: Secondary | ICD-10-CM | POA: Diagnosis not present

## 2022-04-17 DIAGNOSIS — J4530 Mild persistent asthma, uncomplicated: Secondary | ICD-10-CM | POA: Diagnosis not present

## 2022-05-02 DIAGNOSIS — M8588 Other specified disorders of bone density and structure, other site: Secondary | ICD-10-CM | POA: Diagnosis not present

## 2022-05-02 DIAGNOSIS — E782 Mixed hyperlipidemia: Secondary | ICD-10-CM | POA: Diagnosis not present

## 2022-05-03 DIAGNOSIS — M5431 Sciatica, right side: Secondary | ICD-10-CM | POA: Diagnosis not present

## 2022-05-03 DIAGNOSIS — M810 Age-related osteoporosis without current pathological fracture: Secondary | ICD-10-CM | POA: Diagnosis not present

## 2022-05-03 DIAGNOSIS — E782 Mixed hyperlipidemia: Secondary | ICD-10-CM | POA: Diagnosis not present

## 2022-05-03 DIAGNOSIS — E039 Hypothyroidism, unspecified: Secondary | ICD-10-CM | POA: Diagnosis not present

## 2022-05-03 DIAGNOSIS — F330 Major depressive disorder, recurrent, mild: Secondary | ICD-10-CM | POA: Diagnosis not present

## 2022-05-03 DIAGNOSIS — Z0000 Encounter for general adult medical examination without abnormal findings: Secondary | ICD-10-CM | POA: Diagnosis not present

## 2022-05-03 DIAGNOSIS — J4530 Mild persistent asthma, uncomplicated: Secondary | ICD-10-CM | POA: Diagnosis not present

## 2022-05-03 DIAGNOSIS — I10 Essential (primary) hypertension: Secondary | ICD-10-CM | POA: Diagnosis not present

## 2022-05-03 DIAGNOSIS — I700 Atherosclerosis of aorta: Secondary | ICD-10-CM | POA: Diagnosis not present

## 2022-06-21 ENCOUNTER — Telehealth (HOSPITAL_BASED_OUTPATIENT_CLINIC_OR_DEPARTMENT_OTHER): Payer: Self-pay | Admitting: Cardiology

## 2022-06-21 NOTE — Telephone Encounter (Signed)
Spoke with patient @ 3:09 pm regarding new appointment date ,time and provider.  07/02/22 at 1:55 pm---with Laurann [place], NP---Dr. Harrell Gave not in 07/01/22

## 2022-06-24 ENCOUNTER — Ambulatory Visit: Payer: Medicare PPO | Admitting: Physician Assistant

## 2022-06-24 ENCOUNTER — Encounter: Payer: Self-pay | Admitting: Physician Assistant

## 2022-06-24 ENCOUNTER — Ambulatory Visit (INDEPENDENT_AMBULATORY_CARE_PROVIDER_SITE_OTHER)
Admission: RE | Admit: 2022-06-24 | Discharge: 2022-06-24 | Disposition: A | Discharge status: Home / Self Care | Payer: Medicare PPO | Source: Ambulatory Visit | Attending: Physician Assistant | Admitting: Physician Assistant

## 2022-06-24 VITALS — BP 140/80 | HR 80 | Ht 65.0 in | Wt 124.6 lb

## 2022-06-24 DIAGNOSIS — R194 Change in bowel habit: Secondary | ICD-10-CM

## 2022-06-24 NOTE — Patient Instructions (Addendum)
Your provider has requested that you have an abdominal x ray before leaving today. Please go to the basement floor to our Radiology department for the test.  _______________________________________________________  If you are age 86 or older, your body mass index should be between 23-30. Your Body mass index is 20.73 kg/m. If this is out of the aforementioned range listed, please consider follow up with your Primary Care Provider.  If you are age 9 or younger, your body mass index should be between 19-25. Your Body mass index is 20.73 kg/m. If this is out of the aformentioned range listed, please consider follow up with your Primary Care Provider.   ________________________________________________________  The [place] [facility] providers would like to encourage you to use [facility] to communicate with providers for non-urgent requests or questions.  Due to long hold times on the telephone, sending your provider a message by [facility] may be a faster and more efficient way to get a response.  Please allow 48 business hours for a response.  Please remember that this is for non-urgent requests.  _______________________________________________________

## 2022-06-24 NOTE — Progress Notes (Signed)
Chief Complaint: Change in bowel habits:  HPI:    Valerie Decker is an 88 year old female, known to Dr. Hilarie Fredrickson, with a past medical history as listed below including IBS, who was referred to me by Maurice Small, MD for a complaint of change in bowel habits.      04/12/2021 patient seen in clinic for diarrhea.  At that time discussed that she had a recent flare of COVID.  Described messy stools at the time.  She is not taking medications for IBS.  He had fallen off track with the low FODMAP diet.  Previously had used Lomotil and Levsin as needed.  Recommend she return to the low FODMAP diet.  Refilled Hyoscyamine.  Also refilled Lomotil.    Today, the patient tells me that over the past few months she has been radiating back-and-forth between diarrhea and constipation though she tells me that typically she will start with a small few pebbles in the toilet followed by a gush of loose stool and then may have more loose stool in the afternoon.  She is still trying to maintain her low FODMAP diet but tells me that oftentimes she will fall off of this.  Takes MiraLAX in the morning as well as Citrucel and a collagen supplement.  Occasionally she will use a suppository if she feels like she needs to go to the bathroom and cannot move her bowels.  Associated symptoms include some generalized abdominal pain occasionally.    Denies fever, chills, weight loss, blood in her stool or symptoms that awaken her from sleep.  Past Medical History:  Diagnosis Date   Allergic rhinitis, cause unspecified    Asthma    Carpal tunnel syndrome    Diverticulosis    Elevated serum homocysteine level    Esophageal reflux    H. pylori infection 2010   Irritable bowel syndrome    Irritable bowel syndrome with diarrhea    Mixed hyperlipidemia    Neuralgia, neuritis, and radiculitis, unspecified    Osteopenia    Unspecified asthma(493.90)    Unspecified essential hypertension    Unspecified hypothyroidism    Unspecified  sleep apnea    marginal; intolerant to CPAP    Past Surgical History:  Procedure Laterality Date   BREAST BIOPSY     COLONOSCOPY     Tics   DILATION AND CURETTAGE OF UTERUS     facial plastic surgery     TONSILLECTOMY AND ADENOIDECTOMY     ? uvulectomy   TOTAL HIP ARTHROPLASTY Right 09/11/2021   Procedure: TOTAL HIP ARTHROPLASTY ANTERIOR APPROACH;  Surgeon: Paralee Cancel, MD;  Location: [facility];  Service: Orthopedics;  Laterality: Right;   UVULECTOMY      Current Outpatient Medications  Medication Sig Dispense Refill   acetaminophen (TYLENOL) 500 MG tablet Take 1,000 mg by mouth every 6 (six) hours as needed for moderate pain or headache.     albuterol (VENTOLIN HFA) 108 (90 Base) MCG/ACT inhaler Inhale 1 puff into the lungs 2 (two) times daily as needed for wheezing or shortness of breath.     aspirin 81 MG chewable tablet Chew 1 tablet (81 mg total) by mouth 2 (two) times daily.     bisacodyl (DULCOLAX) 10 MG suppository Place 1 suppository (10 mg total) rectally daily as needed for moderate constipation. 12 suppository 0   budesonide-formoterol (SYMBICORT) 160-4.5 MCG/ACT inhaler Inhale 1-2 puffs into the lungs 2 (two) times daily.     CALCIUM CITRATE-VITAMIN D PO Take 2 tablets by  mouth daily. Calcium citrate 400 iu calcium/'630mg'$  vitamin d     Cholecalciferol (VITAMIN D-3) 125 MCG (5000 UT) TABS Take 5,000 Units by mouth daily.     citalopram (CELEXA) 20 MG tablet Take 20 mg by mouth daily.     Coenzyme Q10 (CO Q 10) 60 MG CAPS Take 60 mg by mouth daily.     diltiazem (CARDIZEM CD) 120 MG 24 hr capsule 120 mg daily.     fluticasone (FLONASE) 50 MCG/ACT nasal spray Place 2 sprays into the nose daily.     levocetirizine (XYZAL) 5 MG tablet Take 5 mg by mouth every evening.     levothyroxine (SYNTHROID, LEVOTHROID) 25 MCG tablet TAKE 1 TABLET ONCE DAILY. (Patient taking differently: Take 25 mcg by mouth daily before breakfast.) 90 tablet 1   lisinopril (ZESTRIL) 40 MG tablet Take 40  mg by mouth daily.     Melatonin 5 MG CHEW Chew 5 mg by mouth at bedtime.     methocarbamol (ROBAXIN) 500 MG tablet Take 1 tablet (500 mg total) by mouth every 6 (six) hours as needed for muscle spasms. 10 tablet 0   Methylcellulose, Laxative, (CITRUCEL PO) Take 1 Scoop by mouth daily.     montelukast (SINGULAIR) 10 MG tablet Take 10 mg by mouth at bedtime.     omeprazole (PRILOSEC) 40 MG capsule Take 40 mg by mouth daily before breakfast.     simvastatin (ZOCOR) 10 MG tablet TAKE ONE TABLET AT BEDTIME. (Patient taking differently: Take 10 mg by mouth at bedtime.) 90 tablet 1   Turmeric (QC TUMERIC COMPLEX PO) Take 750 mg by mouth daily.     venlafaxine XR (EFFEXOR-XR) 75 MG 24 hr capsule Take 75 mg by mouth daily with breakfast.     No current facility-administered medications for this visit.    Allergies as of 06/24/2022 - Review Complete 06/24/2022  Allergen Reaction Noted   Penicillins Diarrhea and Nausea And Vomiting 01/17/2016   Tape Rash 10/17/2016    Family History  Problem Relation Age of Onset   Heart failure Mother        liver disease   Liver disease Mother 33   Stroke Maternal Aunt    COPD Maternal Aunt    Lung cancer Maternal Aunt    Asthma Maternal Grandfather    Colon cancer Neg Hx     Social History   Socioeconomic History   Marital status: Married    Spouse name: Not on file   Number of children: 2   Years of education: Not on file   Highest education level: Not on file  Occupational History   Not on file  Tobacco Use   Smoking status: Never    Passive exposure: Never   Smokeless tobacco: Never   Tobacco comments:    Second hand smoke from mother & husband for decades  Vaping Use   Vaping Use: Never used  Substance and Sexual Activity   Alcohol use: No    Alcohol/week: 0.0 standard drinks of alcohol    Comment: Occasionally has a glass of red wine with dinner   Drug use: No   Sexual activity: Not Currently  Other Topics Concern   Not on file   Social History Narrative   Patient is married and lives at home with her husband. She has two children. She is retired. She reports she completed a 4 year degree.    Social Determinants of Health   Financial Resource Strain: Not on file  Food  Insecurity: No Food Insecurity (04/04/2022)   Hunger Vital Sign    Worried About Running Out of Food in the Last Year: Never true    Ran Out of Food in the Last Year: Never true  Transportation Needs: No Transportation Needs (04/04/2022)   PRAPARE - Hydrologist (Medical): No    Lack of Transportation (Non-Medical): No  Physical Activity: Not on file  Stress: Not on file  Social Connections: Not on file  Intimate Partner Violence: Not on file    Review of Systems:    Constitutional: No weight loss, fever or chills Cardiovascular: No chest pain  Respiratory: No SOB Gastrointestinal: See HPI and otherwise negative   Physical Exam:  Vital signs: BP (!) 140/80   Pulse 80   Ht '5\' 5"'$  (1.651 m)   Wt 124 lb 9.6 oz (56.5 kg)   SpO2 95%   BMI 20.73 kg/m    Constitutional:   Pleasant Elderly Caucasian female appears to be in NAD, Well developed, Well nourished, alert and cooperative Respiratory: Respirations even and unlabored. Lungs clear to auscultation bilaterally.   No wheezes, crackles, or rhonchi.  Cardiovascular: Normal S1, S2. No MRG. Regular rate and rhythm. No peripheral edema, cyanosis or pallor.  Gastrointestinal:  Soft, nondistended, nontender. No rebound or guarding. Normal bowel sounds, slightly hypertympanic. No appreciable masses or hepatomegaly. Rectal:  Not performed.  Psychiatric: Demonstrates good judgement and reason without abnormal affect or behaviors.  RELEVANT LABS AND IMAGING: CBC    Component Value Date/Time   WBC 13.9 (H) 09/14/2021 0521   RBC 3.29 (L) 09/14/2021 0521   HGB 10.3 (L) 09/14/2021 0521   HCT 31.4 (L) 09/14/2021 0521   PLT 242 09/14/2021 0521   MCV 95.4 09/14/2021 0521    MCH 31.3 09/14/2021 0521   MCHC 32.8 09/14/2021 0521   RDW 13.9 09/14/2021 0521   LYMPHSABS 1.7 09/10/2021 2223   MONOABS 0.8 09/10/2021 2223   EOSABS 0.1 09/10/2021 2223   BASOSABS 0.1 09/10/2021 2223    CMP     Component Value Date/Time   NA 133 (L) 09/12/2021 0512   K 4.2 09/12/2021 0512   CL 102 09/12/2021 0512   CO2 20 (L) 09/12/2021 0512   GLUCOSE 172 (H) 09/12/2021 0512   BUN 18 09/12/2021 0512   CREATININE 0.78 09/12/2021 0512   CALCIUM 8.1 (L) 09/12/2021 0512   PROT 7.0 02/16/2020 1840   ALBUMIN 4.0 02/16/2020 1840   AST 24 02/16/2020 1840   ALT 18 02/16/2020 1840   ALKPHOS 61 02/16/2020 1840   BILITOT 0.8 02/16/2020 1840   GFRNONAA >60 09/12/2021 0512   GFRAA >60 02/16/2020 1840    Assessment: 1.  Change in bowel habits: Patient describes some pebbles followed by a gush of liquid and more liquid in the afternoon, continues on MiraLAX and a fiber supplement daily will occasionally use Hyoscyamine and Lomotil pending if she has to go somewhere associated with some generalized abdominal pain; consider most likely constipation with overflow versus her typical IBS which radiates back and forth  Plan: 1.  Ordered 2 view abdominal x-ray today.  Based off of this will be able to give her further recommendations. 2.  For now continue MiraLAX and fiber supplement in the morning. 3.  Patient to follow in clinic per recommendations after imaging.  Ellouise Newer, PA-C [facility] 06/24/2022, 2:25 PM  Cc: Maurice Small, MD

## 2022-07-01 ENCOUNTER — Ambulatory Visit (HOSPITAL_BASED_OUTPATIENT_CLINIC_OR_DEPARTMENT_OTHER): Payer: Medicare PPO | Admitting: Cardiology

## 2022-07-01 NOTE — Progress Notes (Unsigned)
Cardiology Clinic Note   Patient Name: Valerie Decker Date of Encounter: 07/02/2022  Primary Care Provider:  Maurice Small, MD Primary Cardiologist:  Buford Dresser, MD  Patient Profile    Valerie Decker is an 88 year-old female with a past medical history of dyspnea on exertion, hypertension, hyperlipidemia, and hypothyroidism who presents to the clinic today for 1 year follow-up of chronic cardiac conditions.  Past Medical History    Past Medical History:  Diagnosis Date   Allergic rhinitis, cause unspecified    Asthma    Carpal tunnel syndrome    Diverticulosis    Elevated serum homocysteine level    Esophageal reflux    H. pylori infection 2010   Irritable bowel syndrome    Irritable bowel syndrome with diarrhea    Mixed hyperlipidemia    Neuralgia, neuritis, and radiculitis, unspecified    Osteopenia    Unspecified asthma(493.90)    Unspecified essential hypertension    Unspecified hypothyroidism    Unspecified sleep apnea    marginal; intolerant to CPAP   Past Surgical History:  Procedure Laterality Date   BREAST BIOPSY     COLONOSCOPY     Tics   DILATION AND CURETTAGE OF UTERUS     facial plastic surgery     TONSILLECTOMY AND ADENOIDECTOMY     ? uvulectomy   TOTAL HIP ARTHROPLASTY Right 09/11/2021   Procedure: TOTAL HIP ARTHROPLASTY ANTERIOR APPROACH;  Surgeon: Paralee Cancel, MD;  Location: [facility];  Service: Orthopedics;  Laterality: Right;   UVULECTOMY      Allergies  Allergies  Allergen Reactions   Penicillins Diarrhea and Nausea And Vomiting   Tape Rash    Red skin after tape removed after surgery    History of Present Illness    Valerie Decker has a past medical history of: Hypertension. Hyperlipidemia. Lipid panel per [facility] 05/02/2022: LDL 109, HDL 79, TG 132, total cholesterol 211.  Hypothyroidism. TSH 10/23/2021: 2.00 Dyspnea on exertion. Nuclear stress test 11/17/2020: Normal study no evidence of ischemia. Echo 12/11/2020: EF 60 to 65%.   Grade I DD.  Valerie Decker was seen as a new patient by Dr. Harrell Gave on 11/13/2020.  At that time she was complaining of dyspnea on exertion.  Scheduled for a nuclear stress test which showed a normal study.  Echo showed an EF of 60 to 65% and Grade I DD.  Today, patient is accompanied by her husband. She continues to have some dyspnea on exertion that she describes as needing to take a deep breath when she exerts herself. She wonders if this could be her asthma. She recently had her right hip replaced after a fall and did well with physical therapy without breathing difficulties. She states she is having a flare of pain with her hip. She has not followed up with orthopedics and is encouraged to do so. Her BP is slightly elevated today at 140/70 at intake and recheck. She states her PCP increased her lisinopril to 40 mg daily in September 2023. She has a BP cuff at home but has not been checking it. She denies chest pain or resting shortness of breath. She reports some mild lower extremity edema at the end of the day.    Home Medications    Current Meds  Medication Sig   acetaminophen (TYLENOL) 500 MG tablet Take 1,000 mg by mouth every 6 (six) hours as needed for moderate pain or headache.   albuterol (VENTOLIN HFA) 108 (90 Base) MCG/ACT inhaler Inhale 1  puff into the lungs 2 (two) times daily as needed for wheezing or shortness of breath.   budesonide-formoterol (SYMBICORT) 160-4.5 MCG/ACT inhaler Inhale 1-2 puffs into the lungs 2 (two) times daily.   CALCIUM CITRATE-VITAMIN D PO Take 2 tablets by mouth daily. Calcium citrate 400 iu calcium/'630mg'$  vitamin d   Cholecalciferol (VITAMIN D-3) 125 MCG (5000 UT) TABS Take 5,000 Units by mouth daily.   citalopram (CELEXA) 20 MG tablet Take 20 mg by mouth daily.   diltiazem (CARDIZEM CD) 120 MG 24 hr capsule 120 mg daily.   fluticasone (FLONASE) 50 MCG/ACT nasal spray Place 2 sprays into the nose daily.   levothyroxine (SYNTHROID, LEVOTHROID) 25 MCG  tablet TAKE 1 TABLET ONCE DAILY. (Patient taking differently: Take 25 mcg by mouth daily before breakfast.)   lisinopril (ZESTRIL) 40 MG tablet Take 40 mg by mouth daily.   Melatonin 5 MG CHEW Chew 5 mg by mouth at bedtime.   Methylcellulose, Laxative, (CITRUCEL PO) Take 1 Scoop by mouth daily.   montelukast (SINGULAIR) 10 MG tablet Take 10 mg by mouth at bedtime.   omeprazole (PRILOSEC) 40 MG capsule Take 40 mg by mouth daily before breakfast.   simvastatin (ZOCOR) 10 MG tablet TAKE ONE TABLET AT BEDTIME. (Patient taking differently: Take 10 mg by mouth at bedtime.)   Turmeric (QC TUMERIC COMPLEX PO) Take 750 mg by mouth daily.   venlafaxine XR (EFFEXOR-XR) 75 MG 24 hr capsule Take 75 mg by mouth daily with breakfast.    Family History    Family History  Problem Relation Age of Onset   Heart failure Mother        liver disease   Liver disease Mother 3   Stroke Maternal Aunt    COPD Maternal Aunt    Lung cancer Maternal Aunt    Asthma Maternal Grandfather    Colon cancer Neg Hx    She indicated that her mother is deceased. She indicated that her father is deceased. She indicated that the status of her maternal grandfather is unknown. She indicated that the status of her neg hx is unknown.   Social History    Social History   Socioeconomic History   Marital status: Married    Spouse name: Not on file   Number of children: 2   Years of education: Not on file   Highest education level: Not on file  Occupational History   Not on file  Tobacco Use   Smoking status: Never    Passive exposure: Never   Smokeless tobacco: Never   Tobacco comments:    Second hand smoke from mother & husband for decades  Vaping Use   Vaping Use: Never used  Substance and Sexual Activity   Alcohol use: No    Alcohol/week: 0.0 standard drinks of alcohol    Comment: Occasionally has a glass of red wine with dinner   Drug use: No   Sexual activity: Not Currently  Other Topics Concern   Not on  file  Social History Narrative   Patient is married and lives at home with her husband. She has two children. She is retired. She reports she completed a 4 year degree.    Social Determinants of Health   Financial Resource Strain: Not on file  Food Insecurity: No Food Insecurity (04/04/2022)   Hunger Vital Sign    Worried About Running Out of Food in the Last Year: Never true    Ran Out of Food in the Last Year: Never true  Transportation Needs: No Transportation Needs (04/04/2022)   PRAPARE - Hydrologist (Medical): No    Lack of Transportation (Non-Medical): No  Physical Activity: Not on file  Stress: Not on file  Social Connections: Not on file  Intimate Partner Violence: Not on file     Review of Systems    General: No chills, fever, night sweats or weight changes.  Cardiovascular:  No chest pain, orthopnea, palpitations, paroxysmal nocturnal dyspnea. Mild DOE. Mild lower extremity edema.  Dermatological: No rash, lesions/masses Respiratory: No cough, dyspnea Urologic: No hematuria, dysuria Abdominal:   No nausea, vomiting, diarrhea, bright red blood per rectum, melena, or hematemesis Neurologic:  No visual changes, weakness, changes in mental status. All other systems reviewed and are otherwise negative except as noted above.  Physical Exam    VS:  BP (!) 140/70 (BP Location: Left Arm, Patient Position: Sitting, Cuff Size: Normal)   Pulse 65   Ht '5\' 5"'$  (1.651 m)   Wt 125 lb 1.6 oz (56.7 kg)   BMI 20.82 kg/m  , BMI Body mass index is 20.82 kg/m. GEN: Well nourished, well developed, in no acute distress. HEENT: Normal. Neck: Supple, no JVD, carotid bruits, or masses. Cardiac: RRR, no rubs, or gallops. 1/6 systolic murmur. No clubbing, cyanosis, edema.  Radials/DP/PT 2+ and equal bilaterally.  Respiratory:  Respirations regular and unlabored, clear to auscultation bilaterally. GI: Soft, nontender, nondistended. MS: No deformity or  atrophy. Skin: Warm and dry, no rash. Neuro: Strength and sensation are intact. Psych: Normal affect.  Accessory Clinical Findings    Recent Labs: 09/11/2021: TSH 1.791 09/12/2021: BUN 18; Creatinine, Ser 0.78; Potassium 4.2; Sodium 133 09/14/2021: Hemoglobin 10.3; Platelets 242   Recent Lipid Panel    Component Value Date/Time   CHOL 221 (H) 09/08/2012 1024   TRIG 100.0 09/08/2012 1024   TRIG 114 06/19/2006 1157   HDL 72.60 09/08/2012 1024   CHOLHDL 3 09/08/2012 1024   VLDL 20.0 09/08/2012 1024   LDLCALC 108 (H) 09/18/2010 1004   LDLDIRECT 119.0 09/08/2012 1024    HYPERTENSION CONTROL Vitals:   07/02/22 1359 07/02/22 1515  BP: (!) 140/70 (!) 140/70    The patient's blood pressure is elevated above target today.  In order to address the patient's elevated BP: Blood pressure will be monitored at home to determine if medication changes need to be made.     ECG personally reviewed by me today NSR, HR 65.  Unchanged from 09/10/2021. }      Assessment & Plan   Hypertension. BP 140/70 at intake and recheck. Patient denies headaches or dizziness. She has a history of a mechanical fall recently but no frequent fall history. Lisinopril increased to 40 mg daily in September 2023 by PCP. Will have patient keep a two week BP log and call in with readings.  Hyperlipidemia. Lipid panel per [facility] 05/02/2022: LDL 109, HDL 79, TG 132, total cholesterol 211. She does not have a history of CAD. Provided information for heart healthy, low sodium diet. Continue Simvastatin 10 mg nightly.  Hypothyroidism. TSH 10/23/2021: 2.00. She denies increased fatigue. Managed by PCP. Continue synthroid 25 mcg daily.  Dyspnea on exertion. April 2022 normal nuclear stress test, echo with EF 60 to 65% and Grade I DD. She reports unchanged DOE from her last visit. Described as needing to pause and take a deep breath when she exerts herself. She has a history of asthma and uses inhalers. She recently did physical  therapy for  her hip without any breathing difficulties.     Disposition: Will call in with 2 week BP log. Follow-up in one year or sooner as needed.    Justice Britain. Adalbert Alberto, NP-C     07/02/2022, 2:17 PM [facility] [address] Office [phone] Fax [phone]   I spent 10 minutes examining this patient, reviewing medications, and using patient centered shared decision making involving her cardiac care.  Prior to her visit I spent greater than 20 minutes reviewing her past medical history,  medications, and prior cardiac tests.

## 2022-07-01 NOTE — Progress Notes (Signed)
Addendum: Reviewed and agree with assessment and management plan. Corbett Moulder M, MD  

## 2022-07-02 ENCOUNTER — Encounter (HOSPITAL_BASED_OUTPATIENT_CLINIC_OR_DEPARTMENT_OTHER): Payer: Self-pay | Admitting: Student

## 2022-07-02 ENCOUNTER — Ambulatory Visit (HOSPITAL_BASED_OUTPATIENT_CLINIC_OR_DEPARTMENT_OTHER): Payer: Medicare PPO | Admitting: Student

## 2022-07-02 VITALS — BP 140/70 | HR 65 | Ht 65.0 in | Wt 125.1 lb

## 2022-07-02 DIAGNOSIS — E782 Mixed hyperlipidemia: Secondary | ICD-10-CM | POA: Diagnosis not present

## 2022-07-02 DIAGNOSIS — I10 Essential (primary) hypertension: Secondary | ICD-10-CM | POA: Diagnosis not present

## 2022-07-02 DIAGNOSIS — R0609 Other forms of dyspnea: Secondary | ICD-10-CM | POA: Diagnosis not present

## 2022-07-02 DIAGNOSIS — E039 Hypothyroidism, unspecified: Secondary | ICD-10-CM

## 2022-07-02 NOTE — Patient Instructions (Signed)
Medication Instructions:  None *If you need a refill on your cardiac medications before your next appointment, please call your pharmacy*   Lab Work: None If you have labs (blood work) drawn today and your tests are completely normal, you will receive your results only by: [place] (if you have MyChart) OR A paper copy in the mail If you have any lab test that is abnormal or we need to change your treatment, we will call you to review the results.   Testing/Procedures: None   Follow-Up: At [facility], you and your health needs are our priority.  As part of our continuing mission to provide you with exceptional heart care, we have created designated Provider Care Teams.  These Care Teams include your primary Cardiologist (physician) and Advanced Practice Providers (APPs -  Physician Assistants and Nurse Practitioners) who all work together to provide you with the care you need, when you need it.  We recommend signing up for the patient portal called "MyChart".  Sign up information is provided on this After Visit Summary.  MyChart is used to connect with patients for Virtual Visits (Telemedicine).  Patients are able to view lab/test results, encounter notes, upcoming appointments, etc.  Non-urgent messages can be sent to your provider as well.   To learn more about what you can do with MyChart, go to [email or web address].    Your next appointment:   12 month(s)  The format for your next appointment:   In Person  Provider:   Buford Dresser, MD    Other Instructions Check Bp 1 to 2 hrs after taking morning medications. Call the office after 2 weeks of Bp checks.   Exercise recommendations: The American Heart Association recommends 150 minutes of moderate intensity exercise weekly. Try 30 minutes of moderate intensity exercise 4-5 times per week. This could include walking, jogging, or swimming.   Heart Healthy Diet Recommendations: A low-salt diet is  recommended. Meats should be grilled, baked, or boiled. Avoid fried foods. Focus on lean protein sources like fish or chicken with vegetables and fruits. The American Heart Association is a Microbiologist!  American Heart Association Diet and Lifeystyle Recommendations

## 2022-07-15 ENCOUNTER — Encounter (HOSPITAL_BASED_OUTPATIENT_CLINIC_OR_DEPARTMENT_OTHER): Payer: Self-pay

## 2022-07-22 DIAGNOSIS — G3184 Mild cognitive impairment, so stated: Secondary | ICD-10-CM | POA: Diagnosis not present

## 2022-08-13 DIAGNOSIS — I10 Essential (primary) hypertension: Secondary | ICD-10-CM | POA: Diagnosis not present

## 2022-08-13 DIAGNOSIS — M48061 Spinal stenosis, lumbar region without neurogenic claudication: Secondary | ICD-10-CM | POA: Diagnosis not present

## 2022-08-21 ENCOUNTER — Other Ambulatory Visit: Payer: Self-pay | Admitting: Physician Assistant

## 2022-08-23 ENCOUNTER — Other Ambulatory Visit: Payer: Self-pay | Admitting: Physician Assistant

## 2022-08-23 NOTE — Telephone Encounter (Signed)
Called and spoke with patient. She informed me that she does not take Levsin or Lomotil on a daily basis. She reports typically taking medications when she has somewhere to go so that she does not have an accident. Patient apologized for not being able to provide more information. I asked her if she takes the medications a couple of times a week and she stated that she doesn't have to take it that often. I informed patient that we have sent in refills to her pharmacy. Pt had no other concerns at the end of the call.

## 2022-10-17 DIAGNOSIS — I10 Essential (primary) hypertension: Secondary | ICD-10-CM | POA: Diagnosis not present

## 2022-11-01 DIAGNOSIS — R350 Frequency of micturition: Secondary | ICD-10-CM | POA: Diagnosis not present

## 2022-11-01 DIAGNOSIS — N3941 Urge incontinence: Secondary | ICD-10-CM | POA: Diagnosis not present

## 2022-11-01 DIAGNOSIS — R351 Nocturia: Secondary | ICD-10-CM | POA: Diagnosis not present

## 2022-11-13 DIAGNOSIS — J4530 Mild persistent asthma, uncomplicated: Secondary | ICD-10-CM | POA: Diagnosis not present

## 2022-11-13 DIAGNOSIS — H1045 Other chronic allergic conjunctivitis: Secondary | ICD-10-CM | POA: Diagnosis not present

## 2022-11-13 DIAGNOSIS — J3089 Other allergic rhinitis: Secondary | ICD-10-CM | POA: Diagnosis not present

## 2022-11-13 DIAGNOSIS — K219 Gastro-esophageal reflux disease without esophagitis: Secondary | ICD-10-CM | POA: Diagnosis not present

## 2022-12-16 DIAGNOSIS — F330 Major depressive disorder, recurrent, mild: Secondary | ICD-10-CM | POA: Diagnosis not present

## 2022-12-16 DIAGNOSIS — I10 Essential (primary) hypertension: Secondary | ICD-10-CM | POA: Diagnosis not present

## 2022-12-16 DIAGNOSIS — J4530 Mild persistent asthma, uncomplicated: Secondary | ICD-10-CM | POA: Diagnosis not present

## 2022-12-16 DIAGNOSIS — I700 Atherosclerosis of aorta: Secondary | ICD-10-CM | POA: Diagnosis not present

## 2023-05-09 DIAGNOSIS — E782 Mixed hyperlipidemia: Secondary | ICD-10-CM | POA: Diagnosis not present

## 2023-05-09 DIAGNOSIS — E039 Hypothyroidism, unspecified: Secondary | ICD-10-CM | POA: Diagnosis not present

## 2023-05-09 DIAGNOSIS — Z79899 Other long term (current) drug therapy: Secondary | ICD-10-CM | POA: Diagnosis not present

## 2023-05-09 DIAGNOSIS — Z0000 Encounter for general adult medical examination without abnormal findings: Secondary | ICD-10-CM | POA: Diagnosis not present

## 2023-05-09 DIAGNOSIS — M810 Age-related osteoporosis without current pathological fracture: Secondary | ICD-10-CM | POA: Diagnosis not present

## 2023-05-09 DIAGNOSIS — F331 Major depressive disorder, recurrent, moderate: Secondary | ICD-10-CM | POA: Diagnosis not present

## 2023-05-09 DIAGNOSIS — Z9181 History of falling: Secondary | ICD-10-CM | POA: Diagnosis not present

## 2023-05-09 DIAGNOSIS — Z23 Encounter for immunization: Secondary | ICD-10-CM | POA: Diagnosis not present

## 2023-05-15 DIAGNOSIS — E871 Hypo-osmolality and hyponatremia: Secondary | ICD-10-CM | POA: Diagnosis not present

## 2023-05-28 DIAGNOSIS — T148XXA Other injury of unspecified body region, initial encounter: Secondary | ICD-10-CM | POA: Diagnosis not present

## 2023-05-28 DIAGNOSIS — Z9181 History of falling: Secondary | ICD-10-CM | POA: Diagnosis not present

## 2023-07-08 ENCOUNTER — Emergency Department (HOSPITAL_COMMUNITY)
Admission: EM | Admit: 2023-07-08 | Discharge: 2023-07-09 | Disposition: A | Discharge status: Home / Self Care | Payer: Medicare PPO | Attending: Emergency Medicine | Admitting: Emergency Medicine

## 2023-07-08 DIAGNOSIS — I700 Atherosclerosis of aorta: Secondary | ICD-10-CM | POA: Diagnosis not present

## 2023-07-08 DIAGNOSIS — I959 Hypotension, unspecified: Secondary | ICD-10-CM | POA: Diagnosis not present

## 2023-07-08 DIAGNOSIS — Z79899 Other long term (current) drug therapy: Secondary | ICD-10-CM | POA: Diagnosis not present

## 2023-07-08 DIAGNOSIS — W19XXXA Unspecified fall, initial encounter: Secondary | ICD-10-CM | POA: Diagnosis not present

## 2023-07-08 DIAGNOSIS — I6782 Cerebral ischemia: Secondary | ICD-10-CM | POA: Diagnosis not present

## 2023-07-08 DIAGNOSIS — M545 Low back pain, unspecified: Secondary | ICD-10-CM | POA: Diagnosis not present

## 2023-07-08 DIAGNOSIS — S0990XA Unspecified injury of head, initial encounter: Secondary | ICD-10-CM | POA: Insufficient documentation

## 2023-07-08 DIAGNOSIS — R55 Syncope and collapse: Secondary | ICD-10-CM | POA: Diagnosis not present

## 2023-07-08 DIAGNOSIS — R42 Dizziness and giddiness: Secondary | ICD-10-CM | POA: Diagnosis not present

## 2023-07-08 DIAGNOSIS — M5136 Other intervertebral disc degeneration, lumbar region with discogenic back pain only: Secondary | ICD-10-CM | POA: Diagnosis not present

## 2023-07-08 DIAGNOSIS — Z043 Encounter for examination and observation following other accident: Secondary | ICD-10-CM | POA: Diagnosis not present

## 2023-07-08 NOTE — ED Triage Notes (Incomplete)
Pt came from home via EMS due to syncope episode. She was at kitchen counter when she became dizzy, fell and hit her head. No obvious deformities and pt denies pain. Endorses dizziness and nausea.  EMS gave 4mg  of zofran and 150 ml of 0.9% sodium chloride. CBG 102

## 2023-07-08 NOTE — ED Triage Notes (Signed)
Pt came from home via EMS due to syncope episode. She was at kitchen counter when she became dizzy, fell and hit her head. No obvious deformities and pt denies pain. Endorses dizziness and nausea. Pt not on blood thinners  EMS gave 4mg  of zofran and 150 ml of 0.9% sodium chloride. CBG 102

## 2023-07-09 ENCOUNTER — Other Ambulatory Visit: Payer: Self-pay

## 2023-07-09 ENCOUNTER — Encounter (HOSPITAL_COMMUNITY): Payer: Self-pay

## 2023-07-09 ENCOUNTER — Emergency Department (HOSPITAL_COMMUNITY): Payer: Medicare PPO

## 2023-07-09 DIAGNOSIS — S0990XA Unspecified injury of head, initial encounter: Secondary | ICD-10-CM | POA: Diagnosis not present

## 2023-07-09 DIAGNOSIS — I700 Atherosclerosis of aorta: Secondary | ICD-10-CM | POA: Diagnosis not present

## 2023-07-09 DIAGNOSIS — I6782 Cerebral ischemia: Secondary | ICD-10-CM | POA: Diagnosis not present

## 2023-07-09 DIAGNOSIS — Z043 Encounter for examination and observation following other accident: Secondary | ICD-10-CM | POA: Diagnosis not present

## 2023-07-09 DIAGNOSIS — M5136 Other intervertebral disc degeneration, lumbar region with discogenic back pain only: Secondary | ICD-10-CM | POA: Diagnosis not present

## 2023-07-09 DIAGNOSIS — W19XXXA Unspecified fall, initial encounter: Secondary | ICD-10-CM | POA: Diagnosis not present

## 2023-07-09 DIAGNOSIS — R55 Syncope and collapse: Secondary | ICD-10-CM | POA: Diagnosis not present

## 2023-07-09 NOTE — ED Provider Notes (Signed)
[place] EMERGENCY DEPARTMENT AT [facility] Provider Note   CSN: [id number] Arrival date & time: 07/08/23  2349     History  Chief Complaint  Patient presents with   Marletta Lor    Valerie Decker is a 89 y.o. female.  Patient presents to the emergency department for evaluation after a fall.  Fall was witnessed by husband who indicated that she did hit her head, no loss of consciousness.  Patient reports that she does not remember falling.  She complains of low back pain but reports that she always has back pain.  No other complaints of injury at this time.       Home Medications Prior to Admission medications   Medication Sig Start Date End Date Taking? Authorizing Provider  acetaminophen (TYLENOL) 500 MG tablet Take 1,000 mg by mouth every 6 (six) hours as needed for moderate pain or headache.    [provider]  albuterol (VENTOLIN HFA) 108 (90 Base) MCG/ACT inhaler Inhale 1 puff into the lungs 2 (two) times daily as needed for wheezing or shortness of breath.    [provider]  budesonide-formoterol (SYMBICORT) 160-4.5 MCG/ACT inhaler Inhale 1-2 puffs into the lungs 2 (two) times daily.    [provider]  CALCIUM CITRATE-VITAMIN D PO Take 2 tablets by mouth daily. Calcium citrate 400 iu calcium/630mg  vitamin d    [provider]  Cholecalciferol (VITAMIN D-3) 125 MCG (5000 UT) TABS Take 5,000 Units by mouth daily.    [provider]  citalopram (CELEXA) 20 MG tablet Take 20 mg by mouth daily. 05/19/14   [provider]  diltiazem (CARDIZEM CD) 120 MG 24 hr capsule 120 mg daily. 03/23/20   [provider]  diphenoxylate-atropine (LOMOTIL) 2.5-0.025 MG tablet Take 1 tablet by mouth as needed. 08/23/22   Unk Lightning, PA  fluticasone (FLONASE) 50 MCG/ACT nasal spray Place 2 sprays into the nose daily.    [provider]  hyoscyamine (LEVSIN SL) 0.125 MG SL tablet DISSOLVE 1 tablet (0.125 mg  total) ON TONGUE every 6 (six) hours as needed. 08/22/22   Unk Lightning, PA  levothyroxine (SYNTHROID, LEVOTHROID) 25 MCG tablet TAKE 1 TABLET ONCE DAILY. Patient taking differently: Take 25 mcg by mouth daily before breakfast. 09/17/13   [name], MD  lisinopril (ZESTRIL) 40 MG tablet Take 40 mg by mouth daily. 04/25/20   [provider]  Melatonin 5 MG CHEW Chew 5 mg by mouth at bedtime.    [provider]  Methylcellulose, Laxative, (CITRUCEL PO) Take 1 Scoop by mouth daily.    [provider]  montelukast (SINGULAIR) 10 MG tablet Take 10 mg by mouth at bedtime.    [provider]  omeprazole (PRILOSEC) 40 MG capsule Take 40 mg by mouth daily before breakfast.    [provider]  simvastatin (ZOCOR) 10 MG tablet TAKE ONE TABLET AT BEDTIME. Patient taking differently: Take 10 mg by mouth at bedtime. 09/21/13   [name], MD  Turmeric (QC TUMERIC COMPLEX PO) Take 750 mg by mouth daily.    [provider]  venlafaxine XR (EFFEXOR-XR) 75 MG 24 hr capsule Take 75 mg by mouth daily with breakfast.    [provider]      Allergies    Penicillins and Tape    Review of Systems   Review of Systems  Physical Exam Updated Vital Signs BP 122/69   Pulse 87   Temp (!) 97.5 F (36.4 C) (  Oral)   Resp (!) 21   Ht 5\' 4"  (1.626 m)   Wt 56.2 kg   SpO2 96%   BMI 21.28 kg/m  Physical Exam Vitals and nursing note reviewed.  Constitutional:      General: She is not in acute distress.    Appearance: She is well-developed.  HENT:     Head: Normocephalic and atraumatic.     Mouth/Throat:     Mouth: Mucous membranes are moist.  Eyes:     General: Vision grossly intact. Gaze aligned appropriately.     Extraocular Movements: Extraocular movements intact.     Conjunctiva/sclera: Conjunctivae normal.  Cardiovascular:     Rate and Rhythm: Normal rate and regular rhythm.     Pulses: Normal pulses.     Heart sounds:  Normal heart sounds, S1 normal and S2 normal. No murmur heard.    No friction rub. No gallop.  Pulmonary:     Effort: Pulmonary effort is normal. No respiratory distress.     Breath sounds: Normal breath sounds.  Abdominal:     General: Bowel sounds are normal.     Palpations: Abdomen is soft.     Tenderness: There is no abdominal tenderness. There is no guarding or rebound.     Hernia: No hernia is present.  Musculoskeletal:        General: No swelling.     Cervical back: Full passive range of motion without pain, normal range of motion and neck supple. No spinous process tenderness or muscular tenderness. Normal range of motion.     Lumbar back: Tenderness present. Positive right straight leg raise test and positive left straight leg raise test.     Right lower leg: No edema.     Left lower leg: No edema.  Skin:    General: Skin is warm and dry.     Capillary Refill: Capillary refill takes less than 2 seconds.     Findings: No ecchymosis, erythema, rash or wound.  Neurological:     General: No focal deficit present.     Mental Status: She is alert and oriented to person, place, and time.     GCS: GCS eye subscore is 4. GCS verbal subscore is 5. GCS motor subscore is 6.     Cranial Nerves: Cranial nerves 2-12 are intact.     Sensory: Sensation is intact.     Motor: Motor function is intact.     Coordination: Coordination is intact.  Psychiatric:        Attention and Perception: Attention normal.        Mood and Affect: Mood normal.        Speech: Speech normal.        Behavior: Behavior normal.     ED Results / Procedures / Treatments   Labs (all labs ordered are listed, but only abnormal results are displayed) Labs Reviewed - No data to display  EKG None  Radiology CT HEAD WO CONTRAST ( )  Result Date: 07/09/2023 CLINICAL DATA:  Head trauma, minor.  Syncopal episode. EXAM: CT HEAD WITHOUT CONTRAST TECHNIQUE: Contiguous axial images were obtained from the base of  the skull through the vertex without intravenous contrast. RADIATION DOSE REDUCTION: This exam was performed according to the departmental dose-optimization program which includes automated exposure control, adjustment of the mA and/or kV according to patient size and/or use of iterative reconstruction technique. COMPARISON:  10/09/2019. FINDINGS: Brain: No acute intracranial hemorrhage, midline shift or mass effect. No extra-axial fluid collection. Diffuse  atrophy is noted. Periventricular white matter hypodensities are present bilaterally. No hydrocephalus. Vascular: No hyperdense vessel or unexpected calcification. Skull: Normal. Negative for fracture or focal lesion. Sinuses/Orbits: No acute finding. Other: None. IMPRESSION: 1. No acute intracranial process. 2. Atrophy with chronic microvascular ischemic changes. Electronically Signed   By: Thornell Sartorius M.D.   On: 07/09/2023 01:58   DG Lumbar Spine Complete  Result Date: 07/09/2023 CLINICAL DATA:  Fall, low back pain EXAM: LUMBAR SPINE - COMPLETE 4+ VIEW COMPARISON:  None Available. FINDINGS: Diffuse degenerative disc and facet disease. Normal alignment. No fracture. SI joints symmetric. Aortic atherosclerosis. No visible aneurysm. IMPRESSION: Diffuse degenerative disc and facet disease. No acute bony abnormality. Electronically Signed   By: Charlett Nose M.D.   On: 07/09/2023 01:58    Procedures Procedures    Medications Ordered in ED Medications - No data to display  ED Course/ Medical Decision Making/ A&P                                 Medical Decision Making Amount and/or Complexity of Data Reviewed Radiology: ordered.   Presents for evaluation after a fall.  Husband reports that she did hit her head pretty hard.  She does not take blood thinners.  No loss of consciousness.  Patient without complaints other than mild low back pain at arrival.  No significant cephalhematoma.  She is awake, alert, no focal neurologic findings.  CT  head without acute changes.  Patient does report chronic lower back pain.  No step-offs or defects on exam.  No radicular pain.  Normal strength and sensation in lower extremities.  No saddle anesthesia, no foot drop.  X-ray of lumbar spine without acute fracture.        Final Clinical Impression(s) / ED Diagnoses Final diagnoses:  Fall, initial encounter  Minor head injury, initial encounter    Rx / DC Orders ED Discharge Orders     None         Wally Shevchenko, Canary Brim, MD 07/09/23 [phone]

## 2023-07-21 DIAGNOSIS — S0990XD Unspecified injury of head, subsequent encounter: Secondary | ICD-10-CM | POA: Diagnosis not present

## 2023-07-21 DIAGNOSIS — Z85820 Personal history of malignant melanoma of skin: Secondary | ICD-10-CM | POA: Diagnosis not present

## 2023-07-21 DIAGNOSIS — S0990XA Unspecified injury of head, initial encounter: Secondary | ICD-10-CM | POA: Diagnosis not present

## 2023-07-21 DIAGNOSIS — D692 Other nonthrombocytopenic purpura: Secondary | ICD-10-CM | POA: Diagnosis not present

## 2023-07-21 DIAGNOSIS — Z85828 Personal history of other malignant neoplasm of skin: Secondary | ICD-10-CM | POA: Diagnosis not present

## 2023-07-21 DIAGNOSIS — L720 Epidermal cyst: Secondary | ICD-10-CM | POA: Diagnosis not present

## 2023-07-21 DIAGNOSIS — W19XXXA Unspecified fall, initial encounter: Secondary | ICD-10-CM | POA: Diagnosis not present

## 2023-07-23 DIAGNOSIS — R55 Syncope and collapse: Secondary | ICD-10-CM | POA: Diagnosis not present

## 2023-07-23 DIAGNOSIS — W19XXXD Unspecified fall, subsequent encounter: Secondary | ICD-10-CM | POA: Diagnosis not present

## 2023-07-24 DIAGNOSIS — R2689 Other abnormalities of gait and mobility: Secondary | ICD-10-CM | POA: Diagnosis not present

## 2023-07-24 DIAGNOSIS — N390 Urinary tract infection, site not specified: Secondary | ICD-10-CM | POA: Diagnosis not present

## 2023-07-28 ENCOUNTER — Other Ambulatory Visit: Payer: Self-pay

## 2023-07-28 ENCOUNTER — Inpatient Hospital Stay (HOSPITAL_COMMUNITY): Payer: Medicare PPO

## 2023-07-28 ENCOUNTER — Encounter (HOSPITAL_COMMUNITY): Payer: Self-pay

## 2023-07-28 ENCOUNTER — Observation Stay (HOSPITAL_COMMUNITY)
Admission: EM | Admit: 2023-07-28 | Discharge: 2023-07-29 | Disposition: A | Discharge status: Home / Self Care | Payer: Medicare PPO | Attending: Internal Medicine | Admitting: Internal Medicine

## 2023-07-28 DIAGNOSIS — R42 Dizziness and giddiness: Secondary | ICD-10-CM | POA: Diagnosis not present

## 2023-07-28 DIAGNOSIS — E039 Hypothyroidism, unspecified: Secondary | ICD-10-CM | POA: Diagnosis not present

## 2023-07-28 DIAGNOSIS — I10 Essential (primary) hypertension: Secondary | ICD-10-CM | POA: Insufficient documentation

## 2023-07-28 DIAGNOSIS — E871 Hypo-osmolality and hyponatremia: Principal | ICD-10-CM | POA: Diagnosis present

## 2023-07-28 DIAGNOSIS — E782 Mixed hyperlipidemia: Secondary | ICD-10-CM | POA: Diagnosis present

## 2023-07-28 DIAGNOSIS — R0902 Hypoxemia: Secondary | ICD-10-CM | POA: Diagnosis not present

## 2023-07-28 DIAGNOSIS — Z79899 Other long term (current) drug therapy: Secondary | ICD-10-CM | POA: Insufficient documentation

## 2023-07-28 DIAGNOSIS — J45909 Unspecified asthma, uncomplicated: Secondary | ICD-10-CM | POA: Insufficient documentation

## 2023-07-28 DIAGNOSIS — R112 Nausea with vomiting, unspecified: Secondary | ICD-10-CM | POA: Diagnosis present

## 2023-07-28 DIAGNOSIS — R110 Nausea: Secondary | ICD-10-CM | POA: Diagnosis not present

## 2023-07-28 DIAGNOSIS — Z96641 Presence of right artificial hip joint: Secondary | ICD-10-CM | POA: Diagnosis not present

## 2023-07-28 LAB — CBC
HCT: 38.7 % (ref 36.0–46.0)
Hemoglobin: 13.3 g/dL (ref 12.0–15.0)
MCH: 31.9 pg (ref 26.0–34.0)
MCHC: 34.4 g/dL (ref 30.0–36.0)
MCV: 92.8 fL (ref 80.0–100.0)
Platelets: 326 10*3/uL (ref 150–400)
RBC: 4.17 MIL/uL (ref 3.87–5.11)
RDW: 13.2 % (ref 11.5–15.5)
WBC: 7.6 10*3/uL (ref 4.0–10.5)
nRBC: 0 % (ref 0.0–0.2)

## 2023-07-28 LAB — BASIC METABOLIC PANEL
Anion gap: 10 (ref 5–15)
Anion gap: 11 (ref 5–15)
BUN: 12 mg/dL (ref 8–23)
BUN: 10 mg/dL (ref 8–23)
CO2: 20 mmol/L — ABNORMAL LOW (ref 22–32)
CO2: 21 mmol/L — ABNORMAL LOW (ref 22–32)
Calcium: 8.8 mg/dL — ABNORMAL LOW (ref 8.9–10.3)
Calcium: 9.2 mg/dL (ref 8.9–10.3)
Chloride: 93 mmol/L — ABNORMAL LOW (ref 98–111)
Chloride: 95 mmol/L — ABNORMAL LOW (ref 98–111)
Creatinine, Ser: 0.91 mg/dL (ref 0.44–1.00)
Creatinine, Ser: 0.98 mg/dL (ref 0.44–1.00)
GFR, Estimated: 60 mL/min — ABNORMAL LOW (ref >60)
GFR, Estimated: 55 mL/min — ABNORMAL LOW (ref >60)
Glucose, Bld: 105 mg/dL — ABNORMAL HIGH (ref 70–99)
Glucose, Bld: 123 mg/dL — ABNORMAL HIGH (ref 70–99)
Potassium: 4.1 mmol/L (ref 3.5–5.1)
Potassium: 4.3 mmol/L (ref 3.5–5.1)
Sodium: 123 mmol/L — ABNORMAL LOW (ref 135–145)
Sodium: 127 mmol/L — ABNORMAL LOW (ref 135–145)

## 2023-07-28 LAB — CBC WITH DIFFERENTIAL/PLATELET
Abs Immature Granulocytes: 0.02 10*3/uL (ref 0.00–0.07)
Basophils Absolute: 0 10*3/uL (ref 0.0–0.1)
Basophils Relative: 1 %
Eosinophils Absolute: 0 10*3/uL (ref 0.0–0.5)
Eosinophils Relative: 0 %
HCT: 38 % (ref 36.0–46.0)
Hemoglobin: 13.1 g/dL (ref 12.0–15.0)
Immature Granulocytes: 0 %
Lymphocytes Relative: 19 %
Lymphs Abs: 1.5 10*3/uL (ref 0.7–4.0)
MCH: 32.2 pg (ref 26.0–34.0)
MCHC: 34.5 g/dL (ref 30.0–36.0)
MCV: 93.4 fL (ref 80.0–100.0)
Monocytes Absolute: 0.5 10*3/uL (ref 0.1–1.0)
Monocytes Relative: 6 %
Neutro Abs: 5.6 10*3/uL (ref 1.7–7.7)
Neutrophils Relative %: 74 %
Platelets: 323 10*3/uL (ref 150–400)
RBC: 4.07 MIL/uL (ref 3.87–5.11)
RDW: 13.1 % (ref 11.5–15.5)
WBC: 7.7 10*3/uL (ref 4.0–10.5)
nRBC: 0 % (ref 0.0–0.2)

## 2023-07-28 LAB — TSH: TSH: 2.6 u[IU]/mL (ref 0.350–4.500)

## 2023-07-28 LAB — URINALYSIS, W/ REFLEX TO CULTURE (INFECTION SUSPECTED)
Bacteria, UA: NONE SEEN
Bilirubin Urine: NEGATIVE
Glucose, UA: NEGATIVE mg/dL
Hgb urine dipstick: NEGATIVE
Ketones, ur: 5 mg/dL — AB
Leukocytes,Ua: NEGATIVE
Nitrite: NEGATIVE
Protein, ur: NEGATIVE mg/dL
Specific Gravity, Urine: 1.012 (ref 1.005–1.030)
pH: 6 (ref 5.0–8.0)

## 2023-07-28 LAB — MAGNESIUM
Magnesium: 1.9 mg/dL (ref 1.7–2.4)
Magnesium: 2.8 mg/dL — ABNORMAL HIGH (ref 1.7–2.4)

## 2023-07-28 LAB — HEMOGLOBIN A1C
Hgb A1c MFr Bld: 5.5 % (ref 4.8–5.6)
Mean Plasma Glucose: 111.15 mg/dL

## 2023-07-28 MED ORDER — HEPARIN SODIUM (PORCINE) 5000 UNIT/ML IJ SOLN
5000.0000 [IU] | Freq: Three times a day (TID) | INTRAMUSCULAR | Status: DC
  Administered 2023-07-28 – 2023-07-29 (×3): 5000 [IU] via SUBCUTANEOUS
  Filled 2023-07-28 (×3): qty 1

## 2023-07-28 MED ORDER — ONDANSETRON HCL 4 MG/2ML IJ SOLN: 4.0000 mg | Freq: Once | INTRAMUSCULAR | Status: DC

## 2023-07-28 MED ORDER — ONDANSETRON HCL 4 MG/2ML IJ SOLN
4.0000 mg | Freq: Once | INTRAMUSCULAR | Status: AC
Start: 2023-07-28 — End: 2023-07-28
  Administered 2023-07-28: 4 mg via INTRAVENOUS
  Filled 2023-07-28: qty 2

## 2023-07-28 MED ORDER — ONDANSETRON HCL 4 MG/2ML IJ SOLN
4.0000 mg | Freq: Four times a day (QID) | INTRAMUSCULAR | Status: DC | PRN
Start: 2023-07-28 — End: 2023-07-29
  Administered 2023-07-28: 4 mg via INTRAVENOUS
  Filled 2023-07-28: qty 2

## 2023-07-28 MED ORDER — SODIUM CHLORIDE 0.9 % IV SOLN: INTRAVENOUS | Status: AC

## 2023-07-28 MED ORDER — ALBUTEROL SULFATE (2.5 MG/3ML) 0.083% IN NEBU: 2.5000 mg | INHALATION_SOLUTION | RESPIRATORY_TRACT | Status: DC | PRN

## 2023-07-28 MED ORDER — ACETAMINOPHEN 650 MG RE SUPP: 650.0000 mg | Freq: Four times a day (QID) | RECTAL | Status: DC | PRN

## 2023-07-28 MED ORDER — LACTATED RINGERS IV BOLUS: 500.0000 mL | Freq: Once | INTRAVENOUS | Status: DC

## 2023-07-28 MED ORDER — METOCLOPRAMIDE HCL 5 MG/ML IJ SOLN
5.0000 mg | Freq: Once | INTRAMUSCULAR | Status: AC
Start: 2023-07-28 — End: 2023-07-28
  Administered 2023-07-28: 5 mg via INTRAVENOUS
  Filled 2023-07-28: qty 2

## 2023-07-28 MED ORDER — ACETAMINOPHEN 325 MG PO TABS: 650.0000 mg | ORAL_TABLET | Freq: Four times a day (QID) | ORAL | Status: DC | PRN

## 2023-07-28 MED ORDER — SODIUM CHLORIDE 0.9 % IV SOLN
12.5000 mg | Freq: Once | INTRAVENOUS | Status: AC
  Administered 2023-07-28: 12.5 mg via INTRAVENOUS
  Filled 2023-07-28: qty 0.5

## 2023-07-28 MED ORDER — MELATONIN 5 MG PO TABS
5.0000 mg | ORAL_TABLET | Freq: Once | ORAL | Status: AC
  Administered 2023-07-28: 5 mg via ORAL
  Filled 2023-07-28: qty 1

## 2023-07-28 MED ORDER — MAGNESIUM SULFATE 2 GM/50ML IV SOLN
2.0000 g | Freq: Once | INTRAVENOUS | Status: AC
  Administered 2023-07-28: 2 g via INTRAVENOUS
  Filled 2023-07-28: qty 50

## 2023-07-28 MED ORDER — ONDANSETRON HCL 4 MG PO TABS: 4.0000 mg | ORAL_TABLET | Freq: Four times a day (QID) | ORAL | Status: DC | PRN

## 2023-07-28 MED ORDER — SODIUM CHLORIDE 0.9 % IV BOLUS
500.0000 mL | Freq: Once | INTRAVENOUS | Status: AC
Start: 2023-07-28 — End: 2023-07-28
  Administered 2023-07-28: 500 mL via INTRAVENOUS

## 2023-07-28 MED ORDER — ONDANSETRON 4 MG PO TBDP
4.0000 mg | ORAL_TABLET | Freq: Once | ORAL | Status: AC
  Administered 2023-07-28: 4 mg via ORAL
  Filled 2023-07-28: qty 1

## 2023-07-28 NOTE — ED Notes (Signed)
Called [facility] for pt pick.

## 2023-07-28 NOTE — ED Notes (Signed)
Pt reporting continuous nausea. MD updated.

## 2023-07-28 NOTE — ED Triage Notes (Addendum)
Pt dx with a bladder infection on Thursday, started on abx, developed N/V. Hx of same in past

## 2023-07-28 NOTE — ED Notes (Signed)
ED TO INPATIENT HANDOFF REPORT  ED Nurse Name and Phone #: Toma Copier [phone]  S Name/Age/Gender Valerie Decker 89 y.o. female Room/Bed: 044C/044C  Code Status   Code Status: Full Code  Home/SNF/Other Home Patient oriented to: self, place, time, and situation Is this baseline? Yes   Triage Complete: Triage complete  Chief Complaint Hyponatremia [E87.1]  Triage Note Pt dx with a bladder infection on Thursday, started on abx, developed N/V. Hx of same in past    Allergies Allergies  Allergen Reactions   Penicillins Diarrhea and Nausea And Vomiting   Tape Rash    Red skin after tape removed after surgery    Level of Care/Admitting Diagnosis ED Disposition     ED Disposition  Admit   Condition  --   Comment  Hospital Area: [facility] [[id number]]  Level of Care: Progressive [102]  Admit to Progressive based on following criteria: GI, ENDOCRINE disease patients with GI bleeding, acute liver failure or pancreatitis, stable with diabetic ketoacidosis or thyrotoxicosis (hypothyroid) state.  May admit patient to [facility] or [facility] if equivalent level of care is available:: Yes  Covid Evaluation: Symptomatic Person Under Investigation (PUI) or recent exposure (last 10 days) *Testing Required*  Diagnosis: Hyponatremia [657846]  Admitting Physician: Lurline Del [[id number]]  Attending Physician: Lurline Del [[id number]]  Certification:: I certify this patient will need inpatient services for at least 2 midnights          B Medical/Surgery History Past Medical History:  Diagnosis Date   Allergic rhinitis, cause unspecified    Asthma    Carpal tunnel syndrome    Diverticulosis    Elevated serum homocysteine level    Esophageal reflux    H. pylori infection 2010   Irritable bowel syndrome    Irritable bowel syndrome with diarrhea    Mixed hyperlipidemia    Neuralgia, neuritis, and radiculitis, unspecified    Osteopenia     Unspecified asthma(493.90)    Unspecified essential hypertension    Unspecified hypothyroidism    Unspecified sleep apnea    marginal; intolerant to CPAP   Past Surgical History:  Procedure Laterality Date   BREAST BIOPSY     COLONOSCOPY     Tics   DILATION AND CURETTAGE OF UTERUS     facial plastic surgery     TONSILLECTOMY AND ADENOIDECTOMY     ? uvulectomy   TOTAL HIP ARTHROPLASTY Right 09/11/2021   Procedure: TOTAL HIP ARTHROPLASTY ANTERIOR APPROACH;  Surgeon: [name], MD;  Location: [facility];  Service: Orthopedics;  Laterality: Right;   UVULECTOMY       A IV Location/Drains/Wounds Patient Lines/Drains/Airways Status     Active Line/Drains/Airways     Name Placement date Placement time Site Days   Peripheral IV 07/28/23 20 G Anterior;Left Forearm 07/28/23  0851  Forearm  less than 1            Intake/Output Last 24 hours  Intake/Output Summary (Last 24 hours) at 07/28/2023 1559 Last data filed at 07/28/2023 1558 Gross per 24 hour  Intake 652.11 ml  Output --  Net 652.11 ml    Labs/Imaging Results for orders placed or performed during the hospital encounter of 07/28/23 (from the past 48 hour(s))  Basic metabolic panel     Status: Abnormal   Collection Time: 07/28/23  6:40 AM  Result Value Ref Range   Sodium 123 (L) 135 - 145 mmol/L   Potassium 4.1 3.5 - 5.1 mmol/L   Chloride  93 (L) 98 - 111 mmol/L   CO2 20 (L) 22 - 32 mmol/L   Glucose, Bld 105 (H) 70 - 99 mg/dL    Comment: Glucose reference range applies only to samples taken after fasting for at least 8 hours.   BUN 12 8 - 23 mg/dL   Creatinine, Ser 2.13 0.44 - 1.00 mg/dL   Calcium 8.8 (L) 8.9 - 10.3 mg/dL   GFR, Estimated 60 (L) >60 mL/min    Comment: (NOTE) Calculated using the CKD-EPI Creatinine Equation (2021)    Anion gap 10 5 - 15    Comment: Performed at [facility], [address], [place], [place] [address]  CBC with Differential     Status: None   Collection Time: 07/28/23  6:40  AM  Result Value Ref Range   WBC 7.7 4.0 - 10.5 K/uL   RBC 4.07 3.87 - 5.11 MIL/uL   Hemoglobin 13.1 12.0 - 15.0 g/dL   HCT 84.6 96.2 - 95.2 %   MCV 93.4 80.0 - 100.0 fL   MCH 32.2 26.0 - 34.0 pg   MCHC 34.5 30.0 - 36.0 g/dL   RDW 84.1 32.4 - 40.1 %   Platelets 323 150 - 400 K/uL   nRBC 0.0 0.0 - 0.2 %   Neutrophils Relative % 74 %   Neutro Abs 5.6 1.7 - 7.7 K/uL   Lymphocytes Relative 19 %   Lymphs Abs 1.5 0.7 - 4.0 K/uL   Monocytes Relative 6 %   Monocytes Absolute 0.5 0.1 - 1.0 K/uL   Eosinophils Relative 0 %   Eosinophils Absolute 0.0 0.0 - 0.5 K/uL   Basophils Relative 1 %   Basophils Absolute 0.0 0.0 - 0.1 K/uL   Immature Granulocytes 0 %   Abs Immature Granulocytes 0.02 0.00 - 0.07 K/uL    Comment: Performed at [facility], [address], [place], [place] [address]  Magnesium     Status: None   Collection Time: 07/28/23  6:40 AM  Result Value Ref Range   Magnesium 1.9 1.7 - 2.4 mg/dL    Comment: Performed at [facility], [address] [place], [place] [address]  Urinalysis, w/ Reflex to Culture (Infection Suspected) -Urine, Clean Catch     Status: Abnormal   Collection Time: 07/28/23  9:01 AM  Result Value Ref Range   Specimen Source URINE, CLEAN CATCH    Color, Urine YELLOW YELLOW   APPearance CLEAR CLEAR   Specific Gravity, Urine 1.012 1.005 - 1.030   pH 6.0 5.0 - 8.0   Glucose, UA NEGATIVE NEGATIVE mg/dL   Hgb urine dipstick NEGATIVE NEGATIVE   Bilirubin Urine NEGATIVE NEGATIVE   Ketones, ur 5 (A) NEGATIVE mg/dL   Protein, ur NEGATIVE NEGATIVE mg/dL   Nitrite NEGATIVE NEGATIVE   Leukocytes,Ua NEGATIVE NEGATIVE   RBC / HPF 0-5 0 - 5 RBC/hpf   WBC, UA 0-5 0 - 5 WBC/hpf    Comment:        Reflex urine culture not performed if WBC <=10, OR if Squamous epithelial cells >5. If Squamous epithelial cells >5 suggest recollection.    Bacteria, UA NONE SEEN NONE SEEN   Squamous Epithelial / HPF 0-5 0 - 5 /HPF    Comment: Performed at [facility], [address], [place], [place] [address]  CBC     Status: None   Collection Time: 07/28/23 10:35 AM  Result Value Ref Range   WBC 7.6 4.0 -  10.5 K/uL   RBC 4.17 3.87 - 5.11 MIL/uL   Hemoglobin 13.3 12.0 - 15.0 g/dL   HCT 62.1 30.8 - 65.7 %   MCV 92.8 80.0 - 100.0 fL   MCH 31.9 26.0 - 34.0 pg   MCHC 34.4 30.0 - 36.0 g/dL   RDW 84.6 96.2 - 95.2 %   Platelets 326 150 - 400 K/uL   nRBC 0.0 0.0 - 0.2 %    Comment: Performed at [facility], [address] [place], [place] [address]  Magnesium     Status: Abnormal   Collection Time: 07/28/23 10:35 AM  Result Value Ref Range   Magnesium 2.8 (H) 1.7 - 2.4 mg/dL    Comment: Performed at [facility], [address], [place], [place] [address]  Basic metabolic panel     Status: Abnormal   Collection Time: 07/28/23 10:35 AM  Result Value Ref Range   Sodium 127 (L) 135 - 145 mmol/L   Potassium 4.3 3.5 - 5.1 mmol/L   Chloride 95 (L) 98 - 111 mmol/L   CO2 21 (L) 22 - 32 mmol/L   Glucose, Bld 123 (H) 70 - 99 mg/dL    Comment: Glucose reference range applies only to samples taken after fasting for at least 8 hours.   BUN 10 8 - 23 mg/dL   Creatinine, Ser 2.72 0.44 - 1.00 mg/dL   Calcium 9.2 8.9 - 53.6 mg/dL   GFR, Estimated 55 (L) >60 mL/min    Comment: (NOTE) Calculated using the CKD-EPI Creatinine Equation (2021)    Anion gap 11 5 - 15    Comment: Performed at [facility], [address] [place], [place] [address]  TSH     Status: None   Collection Time: 07/28/23 10:38 AM  Result Value Ref Range   TSH 2.600 0.350 - 4.500 uIU/mL    Comment: Performed by a 3rd Generation assay with a functional sensitivity of <=0.01 uIU/mL. Performed at [facility], [address] [place], [place] [address]   Hemoglobin A1c     Status: None   Collection Time: 07/28/23 10:38 AM  Result Value Ref Range   Hgb A1c MFr Bld 5.5 4.8 - 5.6 %    Comment: (NOTE) Pre diabetes:          5.7%-6.4%  Diabetes:               >6.4%  Glycemic control for   <7.0% adults with diabetes    Mean Plasma Glucose 111.15 mg/dL    Comment: Performed at [facility], [address] [place], [place] [address]   No results found.  Pending Labs Unresulted Labs (From admission, onward)     Start     Ordered   07/29/23 0500  CBC  Tomorrow morning,   R        07/28/23 1037   07/29/23 0500  Comprehensive metabolic panel  Tomorrow morning,   R        07/28/23 1037   07/28/23 1236  Osmolality  Once,   R        07/28/23 1235   07/28/23 1235  Na and K (sodium & potassium), rand urine  Once,   R        07/28/23 1235   07/28/23 1038  Urinalysis, w/ Reflex to Culture (Infection Suspected) -Urine, Clean Catch  (Urine Culture)  Once,   R       Question:  Specimen  Source  Answer:  Urine, Clean Catch   07/28/23 1037            Vitals/Pain Today's Vitals   07/28/23 1500 07/28/23 1515 07/28/23 1545 07/28/23 1558  BP: (!) 154/93 (!) 146/68 135/70   Pulse: 81 74 78   Resp: (!) 24 20 (!) 21   Temp:    98.3 F (36.8 C)  TempSrc:    Oral  SpO2: 97% 98% 100%   Weight:      Height:      PainSc:        Isolation Precautions No active isolations  Medications Medications  heparin injection 5,000 Units (5,000 Units Subcutaneous Given 07/28/23 1457)  0.9 %  sodium chloride infusion (0 mLs Intravenous Stopped 07/28/23 1558)  acetaminophen (TYLENOL) tablet 650 mg (has no administration in time range)    Or  acetaminophen (TYLENOL) suppository 650 mg (has no administration in time range)  ondansetron (ZOFRAN) tablet 4 mg ( Oral See Alternative 07/28/23 1231)    Or  ondansetron (ZOFRAN) injection 4 mg (4 mg Intravenous Given 07/28/23 1231)  albuterol (PROVENTIL) (2.5 MG/3ML) 0.083% nebulizer solution 2.5 mg (has no administration in time range)  ondansetron (ZOFRAN-ODT) disintegrating tablet 4 mg (4 mg Oral Given 07/28/23 0706)  ondansetron (ZOFRAN) injection 4 mg (4 mg Intravenous Given 07/28/23 0859)  sodium chloride  0.9 % bolus 500 mL (0 mLs Intravenous Stopped 07/28/23 1514)  metoCLOPramide (REGLAN) injection 5 mg (5 mg Intravenous Given 07/28/23 0945)  magnesium sulfate IVPB 2 g 50 mL (0 g Intravenous Stopped 07/28/23 1243)  promethazine (PHENERGAN) 12.5 mg in sodium chloride 0.9 % 1,000 mL infusion (12.5 mg Intravenous New Bag/Given 07/28/23 1556)    Mobility walks     Focused Assessments    R Recommendations: See Admitting Provider Note  Report given to:   Additional Notes:

## 2023-07-28 NOTE — ED Notes (Signed)
Patient transported to CT 

## 2023-07-28 NOTE — H&P (Addendum)
History and Physical    [name]:[id number] DOB: 09/12/1930 DOA: 07/28/2023  PCP: Shirlean Mylar, MD  Patient coming from: home  I have personally briefly reviewed patient's old medical records in [facility]  Chief Complaint: n/v  HPI: Valerie Decker is a 89 y.o. female with medical history significant of asthma,GERD, IBS, HLD,hypothyroidism, OSA intolerant to CPAP who presents to ED with complaint of n/v s/p starting a antibiotic ( bactrim ) for recently diagnosed UTI. Patient notes that her symptoms of nausea started 3 days ago after starting antibiotic for uti.  She notes no current dysuria, diarrhea, fever , sob , chills/ chest pain or abdominal pain.  She does note feeling dizzy.    ED Course:  Afeb, 158/76,Hr 73, rr 20 sat 100% on ra  Wbc: 7.7, hgb 13.1, plt323 NA 123 ( 133), K 4.1, cL 93, bicarb 20,  glu 105,  cr 0.91,  Mag 1.9 UA neg Tx zofran, reglan  Review of Systems: As per HPI otherwise 10 point review of systems negative.   Past Medical History:  Diagnosis Date   Allergic rhinitis, cause unspecified    Asthma    Carpal tunnel syndrome    Diverticulosis    Elevated serum homocysteine level    Esophageal reflux    H. pylori infection 2010   Irritable bowel syndrome    Irritable bowel syndrome with diarrhea    Mixed hyperlipidemia    Neuralgia, neuritis, and radiculitis, unspecified    Osteopenia    Unspecified asthma(493.90)    Unspecified essential hypertension    Unspecified hypothyroidism    Unspecified sleep apnea    marginal; intolerant to CPAP    Past Surgical History:  Procedure Laterality Date   BREAST BIOPSY     COLONOSCOPY     Tics   DILATION AND CURETTAGE OF UTERUS     facial plastic surgery     TONSILLECTOMY AND ADENOIDECTOMY     ? uvulectomy   TOTAL HIP ARTHROPLASTY Right 09/11/2021   Procedure: TOTAL HIP ARTHROPLASTY ANTERIOR APPROACH;  Surgeon: [name], MD;  Location: [facility];  Service: Orthopedics;  Laterality: Right;    UVULECTOMY       reports that she has never smoked. She has never been exposed to tobacco smoke. She has never used smokeless tobacco. She reports that she does not drink alcohol and does not use drugs.  Allergies  Allergen Reactions   Penicillins Diarrhea and Nausea And Vomiting   Tape Rash    Red skin after tape removed after surgery    Family History  Problem Relation Age of Onset   Heart failure Mother        liver disease   Liver disease Mother 40   Stroke Maternal Aunt    COPD Maternal Aunt    Lung cancer Maternal Aunt    Asthma Maternal Grandfather    Colon cancer Neg Hx     Prior to Admission medications   Medication Sig Start Date End Date Taking? Authorizing Provider  acetaminophen (TYLENOL) 500 MG tablet Take 1,000 mg by mouth every 6 (six) hours as needed for moderate pain or headache.    [provider]  albuterol (VENTOLIN HFA) 108 (90 Base) MCG/ACT inhaler Inhale 1 puff into the lungs 2 (two) times daily as needed for wheezing or shortness of breath.    [provider]  budesonide-formoterol (SYMBICORT) 160-4.5 MCG/ACT inhaler Inhale 1-2 puffs into the lungs 2 (two) times daily.    [provider]  CALCIUM CITRATE-VITAMIN D PO Take 2 tablets by mouth daily. Calcium citrate 400 iu calcium/630mg  vitamin d    [provider]  Cholecalciferol (VITAMIN D-3) 125 MCG (5000 UT) TABS Take 5,000 Units by mouth daily.    [provider]  citalopram (CELEXA) 20 MG tablet Take 20 mg by mouth daily. 05/19/14   [provider]  diltiazem (CARDIZEM CD) 120 MG 24 hr capsule 120 mg daily. 03/23/20   [provider]  diphenoxylate-atropine (LOMOTIL) 2.5-0.025 MG tablet Take 1 tablet by mouth as needed. 08/23/22   Unk Lightning, PA  fluticasone (FLONASE) 50 MCG/ACT nasal spray Place 2 sprays into the nose daily.    [provider]  hyoscyamine (LEVSIN SL) 0.125 MG SL tablet DISSOLVE 1 tablet (0.125 mg total)  ON TONGUE every 6 (six) hours as needed. 08/22/22   Unk Lightning, PA  levothyroxine (SYNTHROID, LEVOTHROID) 25 MCG tablet TAKE 1 TABLET ONCE DAILY. Patient taking differently: Take 25 mcg by mouth daily before breakfast. 09/17/13   [name], MD  lisinopril (ZESTRIL) 40 MG tablet Take 40 mg by mouth daily. 04/25/20   [provider]  Melatonin 5 MG CHEW Chew 5 mg by mouth at bedtime.    [provider]  Methylcellulose, Laxative, (CITRUCEL PO) Take 1 Scoop by mouth daily.    [provider]  montelukast (SINGULAIR) 10 MG tablet Take 10 mg by mouth at bedtime.    [provider]  omeprazole (PRILOSEC) 40 MG capsule Take 40 mg by mouth daily before breakfast.    [provider]  simvastatin (ZOCOR) 10 MG tablet TAKE ONE TABLET AT BEDTIME. Patient taking differently: Take 10 mg by mouth at bedtime. 09/21/13   [name], MD  Turmeric (QC TUMERIC COMPLEX PO) Take 750 mg by mouth daily.    [provider]  venlafaxine XR (EFFEXOR-XR) 75 MG 24 hr capsule Take 75 mg by mouth daily with breakfast.    [provider]    Physical Exam: Vitals:   07/28/23 0815 07/28/23 0845 07/28/23 0930 07/28/23 0945  BP: 137/71 124/68 (!) 153/71 (!) 144/71  Pulse: 73 72 73 74  Resp: 19 18 17  (!) 23  Temp:      TempSrc:      SpO2: 98% 99% 98% 99%  Weight:      Height:        Constitutional: NAD, calm, comfortable Vitals:   07/28/23 0815 07/28/23 0845 07/28/23 0930 07/28/23 0945  BP: 137/71 124/68 (!) 153/71 (!) 144/71  Pulse: 73 72 73 74  Resp: 19 18 17  (!) 23  Temp:      TempSrc:      SpO2: 98% 99% 98% 99%  Weight:      Height:       Eyes: PERRL, lids and conjunctivae normal ENMT: Mucous membranes are moist. Posterior pharynx clear of any exudate or lesions.Normal dentition.  Neck: normal, supple, no masses, no thyromegaly Respiratory: clear to auscultation bilaterally, no wheezing, no crackles. Normal respiratory  effort. No accessory muscle use.  Cardiovascular: Regular rate and rhythm, no murmurs / rubs / gallops. No extremity edema. 2+ pedal pulses. Abdomen: no tenderness, no masses palpated. No hepatosplenomegaly. Bowel sounds positive.  Musculoskeletal: no clubbing / cyanosis. No joint deformity upper and lower extremities. Good ROM, no contractures. Normal muscle tone.  Skin: no rashes, lesions, ulcers. No induration Neurologic: CN 2-12 grossly intact. Sensation intact,  Strength 5/5 in all 4.  Psychiatric: Normal judgment and insight.  Alert and oriented x 3. Normal mood.    Labs on Admission: I have personally reviewed following labs and imaging studies  CBC: Recent Labs  Lab 07/28/23 0640  WBC 7.7  NEUTROABS 5.6  HGB 13.1  HCT 38.0  MCV 93.4  PLT 323   Basic Metabolic Panel: Recent Labs  Lab 07/28/23 0640  NA 123*  K 4.1  CL 93*  CO2 20*  GLUCOSE 105*  BUN 12  CREATININE 0.91  CALCIUM 8.8*  MG 1.9   GFR: Estimated Creatinine Clearance: 35.5 mL/min (by C-G formula based on SCr of 0.91 mg/dL). Liver Function Tests: No results for input(s): "AST", "ALT", "ALKPHOS", "BILITOT", "PROT", "ALBUMIN" in the last 168 hours. No results for input(s): "LIPASE", "AMYLASE" in the last 168 hours. No results for input(s): "AMMONIA" in the last 168 hours. Coagulation Profile: No results for input(s): "INR", "PROTIME" in the last 168 hours. Cardiac Enzymes: No results for input(s): "CKTOTAL", "CKMB", "CKMBINDEX", "TROPONINI" in the last 168 hours. BNP (last 3 results) No results for input(s): "PROBNP" in the last 8760 hours. HbA1C: No results for input(s): "HGBA1C" in the last 72 hours. CBG: No results for input(s): "GLUCAP" in the last 168 hours. Lipid Profile: No results for input(s): "CHOL", "HDL", "LDLCALC", "TRIG", "CHOLHDL", "LDLDIRECT" in the last 72 hours. Thyroid Function Tests: No results for input(s): "TSH", "T4TOTAL", "FREET4", "T3FREE", "THYROIDAB" in the last 72  hours. Anemia Panel: No results for input(s): "VITAMINB12", "FOLATE", "FERRITIN", "TIBC", "IRON", "RETICCTPCT" in the last 72 hours. Urine analysis:    Component Value Date/Time   COLORURINE YELLOW 07/28/2023 0901   APPEARANCEUR CLEAR 07/28/2023 0901   LABSPEC 1.012 07/28/2023 0901   PHURINE 6.0 07/28/2023 0901   GLUCOSEU NEGATIVE 07/28/2023 0901   HGBUR NEGATIVE 07/28/2023 0901   BILIRUBINUR NEGATIVE 07/28/2023 0901   BILIRUBINUR Neg 02/27/2012 1544   KETONESUR 5 (A) 07/28/2023 0901   PROTEINUR NEGATIVE 07/28/2023 0901   UROBILINOGEN 0.2 02/27/2012 1544   UROBILINOGEN 0.2 08/19/2011 0749   NITRITE NEGATIVE 07/28/2023 0901   LEUKOCYTESUR NEGATIVE 07/28/2023 0901    Radiological Exams on Admission: No results found.  EKG: Independently reviewed.   Assessment/Plan  Symptomatic Hyponatremia in setting of bactrim use -admit to progressive care  - hold offending agent - of note patient is on ssri/ace/as well as has low normal Na at 133 as baseline , possible lower na potentiated by addition of bactrim insetting of poor po intake  - check urine osmo, chest tsh , urine na  -continue with gently ivfs  -supportive care for nausea  -neuro checks  -can consider renal consult in am if  NA not trending upward   Asthma - no exacerbation , resume home controller regimen  -prn nebs   GERD -ppi    IBS -no active issues currently    HLD - continue statin   Hypothyroidism -resume synthroid  -f/u on tsh   OSA  intolerant to CPAP    DVT prophylaxis:  heparin Code Status:  full/ as discussed per patient wishes in event of cardiac arrest  Family Communication:   Parkerson,Ed (Spouse) [phone] (Mobile)   Disposition Plan: patient  expected to be admitted greater than 2 midnights  Consults called: n/a Admission status: progressive care   Lurline Del MD [facility]   If 7PM-7AM, please contact night-coverage [email or web address] Password  [email or web address]  07/28/2023, 10:12 AM

## 2023-07-28 NOTE — Plan of Care (Signed)
  Problem: Health Behavior/Discharge Planning: Goal: Ability to manage health-related needs will improve Outcome: Progressing   Problem: Clinical Measurements: Goal: Will remain free from infection Outcome: Progressing Goal: Diagnostic test results will improve Outcome: Progressing Goal: Cardiovascular complication will be avoided Outcome: Progressing   Problem: Activity: Goal: Risk for activity intolerance will decrease Outcome: Progressing   Problem: Elimination: Goal: Will not experience complications related to urinary retention Outcome: Progressing   Problem: Skin Integrity: Goal: Risk for impaired skin integrity will decrease Outcome: Progressing

## 2023-07-28 NOTE — ED Provider Notes (Signed)
[place] EMERGENCY DEPARTMENT AT [facility] Provider Note   CSN: [id number] Arrival date & time: 07/28/23  2440     History  Chief Complaint  Patient presents with   Nausea    Valerie Decker is a 89 y.o. female with a PMH of HTN, HLD, hypothyroidism who presented to the ED for nausea.  Patient reports that for the past 2-3 days, she has felt nauseous and generally weak.  She denies vomiting or diarrhea.  Husbandreports that the patient was recently diagnosed with a UTI on Thursday and started on bactrim and had nausea afterward.  Reports she has had similar issues with antibiotics in the past.  Patient otherwise denies abdominal pain, chest pain.  She denies fevers, dysuria, hematuria.  Reports she has been eating and drinking well.  HPI     Home Medications Prior to Admission medications   Medication Sig Start Date End Date Taking? Authorizing Provider  acetaminophen (TYLENOL) 500 MG tablet Take 1,000 mg by mouth every 6 (six) hours as needed for moderate pain or headache.    [provider]  albuterol (VENTOLIN HFA) 108 (90 Base) MCG/ACT inhaler Inhale 1 puff into the lungs 2 (two) times daily as needed for wheezing or shortness of breath.    [provider]  budesonide-formoterol (SYMBICORT) 160-4.5 MCG/ACT inhaler Inhale 1-2 puffs into the lungs 2 (two) times daily.    [provider]  CALCIUM CITRATE-VITAMIN D PO Take 2 tablets by mouth daily. Calcium citrate 400 iu calcium/630mg  vitamin d    [provider]  Cholecalciferol (VITAMIN D-3) 125 MCG (5000 UT) TABS Take 5,000 Units by mouth daily.    [provider]  citalopram (CELEXA) 20 MG tablet Take 20 mg by mouth daily. 05/19/14   [provider]  diltiazem (CARDIZEM CD) 120 MG 24 hr capsule 120 mg daily. 03/23/20   [provider]  diphenoxylate-atropine (LOMOTIL) 2.5-0.025 MG tablet Take 1 tablet by mouth as needed. 08/23/22   Unk Lightning, PA   fluticasone (FLONASE) 50 MCG/ACT nasal spray Place 2 sprays into the nose daily.    [provider]  hyoscyamine (LEVSIN SL) 0.125 MG SL tablet DISSOLVE 1 tablet (0.125 mg total) ON TONGUE every 6 (six) hours as needed. 08/22/22   Unk Lightning, PA  levothyroxine (SYNTHROID, LEVOTHROID) 25 MCG tablet TAKE 1 TABLET ONCE DAILY. Patient taking differently: Take 25 mcg by mouth daily before breakfast. 09/17/13   [name], MD  lisinopril (ZESTRIL) 40 MG tablet Take 40 mg by mouth daily. 04/25/20   [provider]  Melatonin 5 MG CHEW Chew 5 mg by mouth at bedtime.    [provider]  Methylcellulose, Laxative, (CITRUCEL PO) Take 1 Scoop by mouth daily.    [provider]  montelukast (SINGULAIR) 10 MG tablet Take 10 mg by mouth at bedtime.    [provider]  omeprazole (PRILOSEC) 40 MG capsule Take 40 mg by mouth daily before breakfast.    [provider]  simvastatin (ZOCOR) 10 MG tablet TAKE ONE TABLET AT BEDTIME. Patient taking differently: Take 10 mg by mouth at bedtime. 09/21/13   [name], MD  Turmeric (QC TUMERIC COMPLEX PO) Take 750 mg by mouth daily.    [provider]  venlafaxine XR (EFFEXOR-XR) 75 MG 24 hr capsule Take 75 mg by mouth daily with breakfast.    [provider]      Allergies    Penicillins and Tape  Review of Systems   Review of Systems  Physical Exam Updated Vital Signs BP 134/82   Pulse 72   Temp 98 F (36.7 C) (Oral)   Resp 19   Ht 5\' 4"  (1.626 m)   Wt 56.7 kg   SpO2 98%   BMI 21.46 kg/m  Physical Exam Constitutional:      Appearance: Normal appearance.     Comments: Laying in bed comfortably  HENT:     Head: Normocephalic and atraumatic.     Nose: Nose normal.     Mouth/Throat:     Mouth: Mucous membranes are moist.     Pharynx: Oropharynx is clear.  Eyes:     Pupils: Pupils are equal, round, and reactive to light.  Cardiovascular:     Rate and  Rhythm: Normal rate and regular rhythm.     Heart sounds: Normal heart sounds. No murmur heard.    No friction rub. No gallop.  Pulmonary:     Breath sounds: Normal breath sounds. No stridor. No wheezing, rhonchi or rales.  Abdominal:     Palpations: Abdomen is soft.     Tenderness: There is no abdominal tenderness. There is no guarding or rebound.  Musculoskeletal:     Cervical back: Normal range of motion and neck supple.     Right lower leg: No edema.     Left lower leg: No edema.  Skin:    General: Skin is warm and dry.  Neurological:     General: No focal deficit present.     Mental Status: She is alert.     ED Results / Procedures / Treatments   Labs (all labs ordered are listed, but only abnormal results are displayed) Labs Reviewed  BASIC METABOLIC PANEL - Abnormal; Notable for the following components:      Result Value   Sodium 123 (*)    Chloride 93 (*)    CO2 20 (*)    Glucose, Bld 105 (*)    Calcium 8.8 (*)    GFR, Estimated 60 (*)    All other components within normal limits  URINALYSIS, W/ REFLEX TO CULTURE (INFECTION SUSPECTED) - Abnormal; Notable for the following components:   Ketones, ur 5 (*)    All other components within normal limits  CBC WITH DIFFERENTIAL/PLATELET  MAGNESIUM  CBC  MAGNESIUM  BASIC METABOLIC PANEL  URINALYSIS, W/ REFLEX TO CULTURE (INFECTION SUSPECTED)  TSH  HEMOGLOBIN A1C    EKG None  Radiology No results found.  Procedures Procedures    Medications Ordered in ED Medications  heparin injection 5,000 Units (has no administration in time range)  0.9 %  sodium chloride infusion (has no administration in time range)  acetaminophen (TYLENOL) tablet 650 mg (has no administration in time range)    Or  acetaminophen (TYLENOL) suppository 650 mg (has no administration in time range)  ondansetron (ZOFRAN) tablet 4 mg (has no administration in time range)    Or  ondansetron (ZOFRAN) injection 4 mg (has no administration in  time range)  albuterol (PROVENTIL) (2.5 MG/3ML) 0.083% nebulizer solution 2.5 mg (has no administration in time range)  magnesium sulfate IVPB 2 g 50 mL (has no administration in time range)  ondansetron (ZOFRAN-ODT) disintegrating tablet 4 mg (4 mg Oral Given 07/28/23 0706)  ondansetron (ZOFRAN) injection 4 mg (4 mg Intravenous Given 07/28/23 0859)  sodium chloride 0.9 % bolus 500 mL (500 mLs Intravenous New Bag/Given 07/28/23 0858)  metoCLOPramide (REGLAN) injection 5 mg (5 mg Intravenous Given  07/28/23 0945)    ED Course/ Medical Decision Making/ A&P                                 Medical Decision Making Amount and/or Complexity of Data Reviewed Labs: ordered.  Risk Prescription drug management. Decision regarding hospitalization.   Vital signs stable, physical exam reassuring. BMP concerning for hyponatremia with sodium 123, CBC with no leukocytosis or anemia. UA not concerning for infection.  Patient was administered 500 cc normal saline bolus with Zofran and Reglan for nausea.  Etiology of patient's symptoms may be secondary to her antibiotics that she is taking for UTI or her hyponatremia.  Husband reports that they do almost completely restrict her salt intake.  Patient was discussed with the hospitalist service, who has agreed to admit the patient.        Final Clinical Impression(s) / ED Diagnoses Final diagnoses:  Hyponatremia    Rx / DC Orders ED Discharge Orders     None         Janyth Pupa, MD 07/28/23 1041    Blane Ohara, MD 07/29/23 [phone]

## 2023-07-29 DIAGNOSIS — E871 Hypo-osmolality and hyponatremia: Secondary | ICD-10-CM | POA: Diagnosis not present

## 2023-07-29 LAB — COMPREHENSIVE METABOLIC PANEL
ALT: 10 U/L (ref 0–44)
AST: 20 U/L (ref 15–41)
Albumin: 3.4 g/dL — ABNORMAL LOW (ref 3.5–5.0)
Alkaline Phosphatase: 61 U/L (ref 38–126)
Anion gap: 12 (ref 5–15)
BUN: 16 mg/dL (ref 8–23)
CO2: 18 mmol/L — ABNORMAL LOW (ref 22–32)
Calcium: 8.7 mg/dL — ABNORMAL LOW (ref 8.9–10.3)
Chloride: 100 mmol/L (ref 98–111)
Creatinine, Ser: 1 mg/dL (ref 0.44–1.00)
GFR, Estimated: 54 mL/min — ABNORMAL LOW (ref >60)
Glucose, Bld: 83 mg/dL (ref 70–99)
Potassium: 4.3 mmol/L (ref 3.5–5.1)
Sodium: 130 mmol/L — ABNORMAL LOW (ref 135–145)
Total Bilirubin: 0.6 mg/dL (ref <1.2)
Total Protein: 6.4 g/dL — ABNORMAL LOW (ref 6.5–8.1)

## 2023-07-29 LAB — CBC
HCT: 37.4 % (ref 36.0–46.0)
Hemoglobin: 12 g/dL (ref 12.0–15.0)
MCH: 31.8 pg (ref 26.0–34.0)
MCHC: 32.1 g/dL (ref 30.0–36.0)
MCV: 99.2 fL (ref 80.0–100.0)
Platelets: 284 10*3/uL (ref 150–400)
RBC: 3.77 MIL/uL — ABNORMAL LOW (ref 3.87–5.11)
RDW: 13.4 % (ref 11.5–15.5)
WBC: 6.7 10*3/uL (ref 4.0–10.5)
nRBC: 0 % (ref 0.0–0.2)

## 2023-07-29 MED ORDER — ORAL CARE MOUTH RINSE: 15.0000 mL | OROMUCOSAL | Status: DC | PRN

## 2023-07-29 NOTE — Care Management CC44 (Signed)
Condition Code 44 Documentation Completed  Patient Details  Name: Jakyiah Steffenhagen MRN: [id number] Date of Birth: [birth year]   Condition Code 44 given:  Yes Patient signature on Condition Code 44 notice:   (declined to sign) Documentation of 2 MD's agreement:  Yes Code 44 added to claim:  Yes    Howell Rucks, RN 07/29/2023, 9:40 AM

## 2023-07-29 NOTE — TOC CM/SW Note (Signed)
Transition of Care [facility]) - Inpatient Brief Assessment   Patient Details  Name: Colin Puck MRN: [id number] Date of Birth: 10/21/1930  [facility] CM/SW Contact:    Howell Rucks, RN Phone Number: 07/29/2023, 9:42 AM   Clinical Narrative: Met with pt and spouse at bedside to introduce role of TOC/NCM and review for dc planning. Pt has documented PCP and pharmacy on file, resides with spouse who will provide transportation at discharge. Order for change to OBS status, Code 44 explained to pt/spouse, declined to sign Code 44 form, copy of Code 44-MOON provided to pt. No further TOC needs identified.     Transition of Care Asessment: Insurance and Status: Insurance coverage has been reviewed Patient has primary care physician: Yes Home environment has been reviewed: resides with spouse in private residence   Prior/Current Home Services: No current home services Social Determinants of Health Reivew: SDOH reviewed no interventions necessary Readmission risk has been reviewed: Yes Transition of care needs: no transition of care needs at this time

## 2023-07-29 NOTE — Hospital Course (Signed)
Valerie Decker is a 89 year old female with PMH asthma, diverticulosis, IBS, HLD, HTN, hypothyroidism who presented with nausea and vomiting with some dizziness.  She has recently been started on Bactrim outpatient for UTI and completed 3 days.  Symptoms began after starting the antibiotic. Due to worsening weakness, she presented to the ER for further evaluation.  Sodium level was found to be 123.  Remainder lab workup relatively unremarkable.  She was started on fluids and monitored overnight.  Sodium level improved to 130 in the morning following admission and symptoms resolved.  She was recommended to discontinue Bactrim upon returning home.  Urinalysis was also obtained during hospitalization which was negative for further signs of infection. She was discharged home in the care of her husband and amenable with the plan.

## 2023-07-29 NOTE — Discharge Summary (Signed)
Physician Discharge Summary   Idman Vittitoe ZOX:[id number] DOB: 1931/07/19 DOA: 07/28/2023  PCP: Shirlean Mylar, MD  Admit date: 07/28/2023 Discharge date: 07/29/2023   Admitted From: Home Disposition:  Home Discharging physician: Lewie Chamber, MD Barriers to discharge: none  Recommendations at discharge: Continue chronic medical management Repeat BMP  Discharge Condition: stable CODE STATUS: Full Diet recommendation:  Diet Orders (From admission, onward)     Start     Ordered   07/29/23 0754  Diet regular Fluid consistency: Thin  Diet effective now       Question:  Fluid consistency:  Answer:  Thin   07/29/23 0753   07/29/23 0000  Diet general        07/29/23 0913            Hospital Course: Ms. Jacome is a 89 year old female with PMH asthma, diverticulosis, IBS, HLD, HTN, hypothyroidism who presented with nausea and vomiting with some dizziness.  She has recently been started on Bactrim outpatient for UTI and completed 3 days.  Symptoms began after starting the antibiotic. Due to worsening weakness, she presented to the ER for further evaluation.  Sodium level was found to be 123.  Remainder lab workup relatively unremarkable.  She was started on fluids and monitored overnight.  Sodium level improved to 130 in the morning following admission and symptoms resolved.  She was recommended to discontinue Bactrim upon returning home.  Urinalysis was also obtained during hospitalization which was negative for further signs of infection. She was discharged home in the care of her husband and amenable with the plan.   The patient's acute and chronic medical conditions were treated accordingly. On day of discharge, patient was felt deemed stable for discharge. Patient/family member advised to call PCP or come back to ER if needed.   Principal Diagnosis: Hyponatremia  Discharge Diagnoses: Active Hospital Problems   Diagnosis Date Noted   Hyponatremia 07/28/2023    Priority: 1.    HYPERLIPIDEMIA 07/31/2007   Hypothyroidism 05/14/2007   HTN (hypertension) 05/14/2007    Resolved Hospital Problems  No resolved problems to display.     Discharge Instructions     Diet general   Complete by: As directed    Increase activity slowly   Complete by: As directed       Allergies as of 07/29/2023       Reactions   Penicillins Diarrhea, Nausea And Vomiting   Tape Rash   Red skin after tape removed after surgery        Medication List     STOP taking these medications    sulfamethoxazole-trimethoprim 800-160 MG tablet Commonly known as: BACTRIM DS       TAKE these medications    acetaminophen 500 MG tablet Commonly known as: TYLENOL Take 1,000 mg by mouth every 6 (six) hours as needed for moderate pain or headache.   budesonide-formoterol 160-4.5 MCG/ACT inhaler Commonly known as: SYMBICORT Inhale 2 puffs into the lungs 2 (two) times daily.   citalopram 20 MG tablet Commonly known as: CELEXA Take 20 mg by mouth daily.   diltiazem 120 MG 24 hr capsule Commonly known as: CARDIZEM CD 120 mg daily.   fluticasone 50 MCG/ACT nasal spray Commonly known as: FLONASE Place 1-2 sprays into the nose 2 (two) times daily.   indapamide 1.25 MG tablet Commonly known as: LOZOL Take 1.25 mg by mouth daily.   levocetirizine 5 MG tablet Commonly known as: XYZAL Take 5 mg by mouth daily.   levothyroxine 25 MCG  tablet Commonly known as: SYNTHROID TAKE 1 TABLET ONCE DAILY. What changed: when to take this   lisinopril 40 MG tablet Commonly known as: ZESTRIL Take 40 mg by mouth daily.   montelukast 10 MG tablet Commonly known as: SINGULAIR Take 10 mg by mouth at bedtime.   OVER THE COUNTER MEDICATION Take 1 capsule by mouth at bedtime. OTC Relaxium sleep aid   simvastatin 10 MG tablet Commonly known as: ZOCOR TAKE ONE TABLET AT BEDTIME.        Allergies  Allergen Reactions   Penicillins Diarrhea and Nausea And Vomiting   Tape Rash    Red  skin after tape removed after surgery    Consultations:   Procedures:   Discharge Exam: BP 135/79 (BP Location: Right Arm)   Pulse 80   Temp 98.1 F (36.7 C) (Oral)   Resp 18   Ht 5\' 4"  (1.626 m)   Wt 55.4 kg   SpO2 95%   BMI 20.96 kg/m  Physical Exam Constitutional:      Appearance: Normal appearance.  HENT:     Head: Normocephalic and atraumatic.     Mouth/Throat:     Mouth: Mucous membranes are moist.  Eyes:     Extraocular Movements: Extraocular movements intact.  Cardiovascular:     Rate and Rhythm: Normal rate and regular rhythm.  Pulmonary:     Effort: Pulmonary effort is normal. No respiratory distress.     Breath sounds: Normal breath sounds. No wheezing.  Abdominal:     General: Bowel sounds are normal. There is no distension.     Palpations: Abdomen is soft.     Tenderness: There is no abdominal tenderness.  Musculoskeletal:        General: Normal range of motion.     Cervical back: Normal range of motion and neck supple.  Skin:    General: Skin is warm and dry.  Neurological:     General: No focal deficit present.     Mental Status: She is alert.  Psychiatric:        Mood and Affect: Mood normal.      The results of significant diagnostics from this hospitalization (including imaging, microbiology, ancillary and laboratory) are listed below for reference.   Microbiology: No results found for this or any previous visit (from the past 240 hour(s)).   Labs: BNP (last 3 results) No results for input(s): "BNP" in the last 8760 hours. Basic Metabolic Panel: Recent Labs  Lab 07/28/23 0640 07/28/23 1035 07/29/23 0335  NA 123* 127* 130*  K 4.1 4.3 4.3  CL 93* 95* 100  CO2 20* 21* 18*  GLUCOSE 105* 123* 83  BUN 12 10 16   CREATININE 0.91 0.98 1.00  CALCIUM 8.8* 9.2 8.7*  MG 1.9 2.8*  --    Liver Function Tests: Recent Labs  Lab 07/29/23 0335  AST 20  ALT 10  ALKPHOS 61  BILITOT 0.6  PROT 6.4*  ALBUMIN 3.4*   No results for  input(s): "LIPASE", "AMYLASE" in the last 168 hours. No results for input(s): "AMMONIA" in the last 168 hours. CBC: Recent Labs  Lab 07/28/23 0640 07/28/23 1035 07/29/23 0335  WBC 7.7 7.6 6.7  NEUTROABS 5.6  --   --   HGB 13.1 13.3 12.0  HCT 38.0 38.7 37.4  MCV 93.4 92.8 99.2  PLT 323 326 284   Cardiac Enzymes: No results for input(s): "CKTOTAL", "CKMB", "CKMBINDEX", "TROPONINI" in the last 168 hours. BNP: Invalid input(s): "POCBNP" CBG: No results  for input(s): "GLUCAP" in the last 168 hours. D-Dimer No results for input(s): "DDIMER" in the last 72 hours. Hgb A1c Recent Labs    07/28/23 1038  HGBA1C 5.5   Lipid Profile No results for input(s): "CHOL", "HDL", "LDLCALC", "TRIG", "CHOLHDL", "LDLDIRECT" in the last 72 hours. Thyroid function studies Recent Labs    07/28/23 1038  TSH 2.600   Anemia work up No results for input(s): "VITAMINB12", "FOLATE", "FERRITIN", "TIBC", "IRON", "RETICCTPCT" in the last 72 hours. Urinalysis    Component Value Date/Time   COLORURINE YELLOW 07/28/2023 0901   APPEARANCEUR CLEAR 07/28/2023 0901   LABSPEC 1.012 07/28/2023 0901   PHURINE 6.0 07/28/2023 0901   GLUCOSEU NEGATIVE 07/28/2023 0901   HGBUR NEGATIVE 07/28/2023 0901   BILIRUBINUR NEGATIVE 07/28/2023 0901   BILIRUBINUR Neg 02/27/2012 1544   KETONESUR 5 (A) 07/28/2023 0901   PROTEINUR NEGATIVE 07/28/2023 0901   UROBILINOGEN 0.2 02/27/2012 1544   UROBILINOGEN 0.2 08/19/2011 0749   NITRITE NEGATIVE 07/28/2023 0901   LEUKOCYTESUR NEGATIVE 07/28/2023 0901   Sepsis Labs Recent Labs  Lab 07/28/23 0640 07/28/23 1035 07/29/23 0335  WBC 7.7 7.6 6.7   Microbiology No results found for this or any previous visit (from the past 240 hour(s)).  Procedures/Studies: CT HEAD WO CONTRAST ( )  Result Date: 07/28/2023 CLINICAL DATA:  Central vertigo. EXAM: CT HEAD WITHOUT CONTRAST TECHNIQUE: Contiguous axial images were obtained from the base of the skull through the vertex  without intravenous contrast. RADIATION DOSE REDUCTION: This exam was performed according to the departmental dose-optimization program which includes automated exposure control, adjustment of the mA and/or kV according to patient size and/or use of iterative reconstruction technique. COMPARISON:  Head CT 07/09/2023. FINDINGS: Brain: No acute hemorrhage. Unchanged chronic small-vessel disease. Cortical gray-white differentiation is otherwise preserved. Prominence of the ventricles and sulci within expected range for age. No hydrocephalus or extra-axial collection. No mass effect or midline shift. Vascular: No hyperdense vessel or unexpected calcification. Skull: No calvarial fracture or suspicious bone lesion. Skull base is unremarkable. Sinuses/Orbits: No acute finding. Other: None. IMPRESSION: 1. No acute intracranial abnormality. 2. Unchanged chronic small-vessel disease. Electronically Signed   By: Orvan Falconer M.D.   On: 07/28/2023 16:05   CT HEAD WO CONTRAST ( )  Result Date: 07/09/2023 CLINICAL DATA:  Head trauma, minor.  Syncopal episode. EXAM: CT HEAD WITHOUT CONTRAST TECHNIQUE: Contiguous axial images were obtained from the base of the skull through the vertex without intravenous contrast. RADIATION DOSE REDUCTION: This exam was performed according to the departmental dose-optimization program which includes automated exposure control, adjustment of the mA and/or kV according to patient size and/or use of iterative reconstruction technique. COMPARISON:  10/09/2019. FINDINGS: Brain: No acute intracranial hemorrhage, midline shift or mass effect. No extra-axial fluid collection. Diffuse atrophy is noted. Periventricular white matter hypodensities are present bilaterally. No hydrocephalus. Vascular: No hyperdense vessel or unexpected calcification. Skull: Normal. Negative for fracture or focal lesion. Sinuses/Orbits: No acute finding. Other: None. IMPRESSION: 1. No acute intracranial process. 2.  Atrophy with chronic microvascular ischemic changes. Electronically Signed   By: Thornell Sartorius M.D.   On: 07/09/2023 01:58   DG Lumbar Spine Complete  Result Date: 07/09/2023 CLINICAL DATA:  Fall, low back pain EXAM: LUMBAR SPINE - COMPLETE 4+ VIEW COMPARISON:  None Available. FINDINGS: Diffuse degenerative disc and facet disease. Normal alignment. No fracture. SI joints symmetric. Aortic atherosclerosis. No visible aneurysm. IMPRESSION: Diffuse degenerative disc and facet disease. No acute bony abnormality. Electronically Signed   By: Charlett Nose  M.D.   On: 07/09/2023 01:58     Time coordinating discharge: Over 30 minutes    Lewie Chamber, MD  [facility] 07/29/2023, 12:36 PM

## 2023-07-29 NOTE — Care Management Obs Status (Signed)
MEDICARE OBSERVATION STATUS NOTIFICATION   Patient Details  Name: Valerie Decker MRN: [id number] Date of Birth: 02/21/1931   Medicare Observation Status Notification Given:       Howell Rucks, RN 07/29/2023, 9:40 AM

## 2023-07-31 DIAGNOSIS — N390 Urinary tract infection, site not specified: Secondary | ICD-10-CM | POA: Diagnosis not present

## 2023-07-31 DIAGNOSIS — E871 Hypo-osmolality and hyponatremia: Secondary | ICD-10-CM | POA: Diagnosis not present

## 2023-07-31 DIAGNOSIS — R110 Nausea: Secondary | ICD-10-CM | POA: Diagnosis not present

## 2023-07-31 DIAGNOSIS — I10 Essential (primary) hypertension: Secondary | ICD-10-CM | POA: Diagnosis not present

## 2023-07-31 DIAGNOSIS — E039 Hypothyroidism, unspecified: Secondary | ICD-10-CM | POA: Diagnosis not present

## 2023-08-11 ENCOUNTER — Other Ambulatory Visit: Payer: Self-pay | Admitting: Family Medicine

## 2023-08-11 DIAGNOSIS — R110 Nausea: Secondary | ICD-10-CM

## 2023-08-19 ENCOUNTER — Ambulatory Visit
Admission: RE | Admit: 2023-08-19 | Discharge: 2023-08-19 | Disposition: A | Discharge status: Home / Self Care | Payer: Medicare PPO | Source: Ambulatory Visit | Attending: Family Medicine | Admitting: Family Medicine

## 2023-08-19 DIAGNOSIS — R110 Nausea: Secondary | ICD-10-CM | POA: Diagnosis not present

## 2023-09-03 DIAGNOSIS — I10 Essential (primary) hypertension: Secondary | ICD-10-CM | POA: Diagnosis not present

## 2023-09-03 DIAGNOSIS — F331 Major depressive disorder, recurrent, moderate: Secondary | ICD-10-CM | POA: Diagnosis not present

## 2023-09-03 DIAGNOSIS — E871 Hypo-osmolality and hyponatremia: Secondary | ICD-10-CM | POA: Diagnosis not present

## 2023-11-04 DIAGNOSIS — G301 Alzheimer's disease with late onset: Secondary | ICD-10-CM | POA: Diagnosis not present

## 2023-11-04 DIAGNOSIS — F02B4 Dementia in other diseases classified elsewhere, moderate, with anxiety: Secondary | ICD-10-CM | POA: Diagnosis not present

## 2023-11-04 DIAGNOSIS — I10 Essential (primary) hypertension: Secondary | ICD-10-CM | POA: Diagnosis not present

## 2023-11-04 DIAGNOSIS — R413 Other amnesia: Secondary | ICD-10-CM | POA: Diagnosis not present

## 2023-11-26 DIAGNOSIS — H1045 Other chronic allergic conjunctivitis: Secondary | ICD-10-CM | POA: Diagnosis not present

## 2023-11-26 DIAGNOSIS — L723 Sebaceous cyst: Secondary | ICD-10-CM | POA: Diagnosis not present

## 2023-11-26 DIAGNOSIS — R42 Dizziness and giddiness: Secondary | ICD-10-CM | POA: Diagnosis not present

## 2023-11-26 DIAGNOSIS — J4530 Mild persistent asthma, uncomplicated: Secondary | ICD-10-CM | POA: Diagnosis not present

## 2023-11-26 DIAGNOSIS — R030 Elevated blood-pressure reading, without diagnosis of hypertension: Secondary | ICD-10-CM | POA: Diagnosis not present

## 2023-11-26 DIAGNOSIS — Q181 Preauricular sinus and cyst: Secondary | ICD-10-CM | POA: Diagnosis not present

## 2023-11-26 DIAGNOSIS — R110 Nausea: Secondary | ICD-10-CM | POA: Diagnosis not present

## 2023-11-26 DIAGNOSIS — J3089 Other allergic rhinitis: Secondary | ICD-10-CM | POA: Diagnosis not present

## 2023-11-26 DIAGNOSIS — K219 Gastro-esophageal reflux disease without esophagitis: Secondary | ICD-10-CM | POA: Diagnosis not present

## 2023-12-12 DIAGNOSIS — Z961 Presence of intraocular lens: Secondary | ICD-10-CM | POA: Diagnosis not present

## 2023-12-12 DIAGNOSIS — H04123 Dry eye syndrome of bilateral lacrimal glands: Secondary | ICD-10-CM | POA: Diagnosis not present

## 2023-12-12 DIAGNOSIS — H52203 Unspecified astigmatism, bilateral: Secondary | ICD-10-CM | POA: Diagnosis not present

## 2023-12-15 DIAGNOSIS — Z85828 Personal history of other malignant neoplasm of skin: Secondary | ICD-10-CM | POA: Diagnosis not present

## 2023-12-15 DIAGNOSIS — Z85820 Personal history of malignant melanoma of skin: Secondary | ICD-10-CM | POA: Diagnosis not present

## 2023-12-15 DIAGNOSIS — L720 Epidermal cyst: Secondary | ICD-10-CM | POA: Diagnosis not present

## 2023-12-16 DIAGNOSIS — F02B4 Dementia in other diseases classified elsewhere, moderate, with anxiety: Secondary | ICD-10-CM | POA: Diagnosis not present

## 2023-12-16 DIAGNOSIS — G301 Alzheimer's disease with late onset: Secondary | ICD-10-CM | POA: Diagnosis not present

## 2023-12-23 DIAGNOSIS — N3941 Urge incontinence: Secondary | ICD-10-CM | POA: Diagnosis not present

## 2023-12-23 DIAGNOSIS — R351 Nocturia: Secondary | ICD-10-CM | POA: Diagnosis not present

## 2024-01-23 DIAGNOSIS — H04123 Dry eye syndrome of bilateral lacrimal glands: Secondary | ICD-10-CM | POA: Diagnosis not present

## 2024-01-23 DIAGNOSIS — H5712 Ocular pain, left eye: Secondary | ICD-10-CM | POA: Diagnosis not present

## 2024-02-03 DIAGNOSIS — R351 Nocturia: Secondary | ICD-10-CM | POA: Diagnosis not present

## 2024-02-03 DIAGNOSIS — N3941 Urge incontinence: Secondary | ICD-10-CM | POA: Diagnosis not present

## 2024-04-07 DIAGNOSIS — R6889 Other general symptoms and signs: Secondary | ICD-10-CM | POA: Diagnosis not present

## 2024-04-07 DIAGNOSIS — F0390 Unspecified dementia without behavioral disturbance: Secondary | ICD-10-CM | POA: Diagnosis not present

## 2024-04-07 DIAGNOSIS — F02B4 Dementia in other diseases classified elsewhere, moderate, with anxiety: Secondary | ICD-10-CM | POA: Diagnosis not present

## 2024-04-07 DIAGNOSIS — N390 Urinary tract infection, site not specified: Secondary | ICD-10-CM | POA: Diagnosis not present

## 2024-04-07 DIAGNOSIS — M549 Dorsalgia, unspecified: Secondary | ICD-10-CM | POA: Diagnosis not present

## 2024-04-07 DIAGNOSIS — M47814 Spondylosis without myelopathy or radiculopathy, thoracic region: Secondary | ICD-10-CM | POA: Diagnosis not present

## 2024-05-17 ENCOUNTER — Encounter (HOSPITAL_COMMUNITY): Payer: Self-pay | Admitting: Radiology

## 2024-05-17 ENCOUNTER — Other Ambulatory Visit: Payer: Self-pay

## 2024-05-17 ENCOUNTER — Emergency Department (HOSPITAL_COMMUNITY)
Admission: EM | Admit: 2024-05-17 | Discharge: 2024-05-17 | Disposition: A | Discharge status: Home / Self Care | Attending: Emergency Medicine | Admitting: Emergency Medicine

## 2024-05-17 ENCOUNTER — Emergency Department (HOSPITAL_COMMUNITY)

## 2024-05-17 DIAGNOSIS — I10 Essential (primary) hypertension: Secondary | ICD-10-CM | POA: Diagnosis not present

## 2024-05-17 DIAGNOSIS — R531 Weakness: Secondary | ICD-10-CM | POA: Insufficient documentation

## 2024-05-17 DIAGNOSIS — J45909 Unspecified asthma, uncomplicated: Secondary | ICD-10-CM | POA: Insufficient documentation

## 2024-05-17 DIAGNOSIS — R110 Nausea: Secondary | ICD-10-CM | POA: Diagnosis present

## 2024-05-17 DIAGNOSIS — R5383 Other fatigue: Secondary | ICD-10-CM | POA: Insufficient documentation

## 2024-05-17 DIAGNOSIS — R109 Unspecified abdominal pain: Secondary | ICD-10-CM | POA: Insufficient documentation

## 2024-05-17 DIAGNOSIS — Z7951 Long term (current) use of inhaled steroids: Secondary | ICD-10-CM | POA: Diagnosis not present

## 2024-05-17 DIAGNOSIS — G309 Alzheimer's disease, unspecified: Secondary | ICD-10-CM | POA: Diagnosis not present

## 2024-05-17 DIAGNOSIS — Z79899 Other long term (current) drug therapy: Secondary | ICD-10-CM | POA: Insufficient documentation

## 2024-05-17 DIAGNOSIS — E039 Hypothyroidism, unspecified: Secondary | ICD-10-CM | POA: Diagnosis not present

## 2024-05-17 LAB — CBC WITH DIFFERENTIAL/PLATELET
Abs Immature Granulocytes: 0.02 K/uL (ref 0.00–0.07)
Basophils Absolute: 0.1 K/uL (ref 0.0–0.1)
Basophils Relative: 2 %
Eosinophils Absolute: 0.5 K/uL (ref 0.0–0.5)
Eosinophils Relative: 7 %
HCT: 38.5 % (ref 36.0–46.0)
Hemoglobin: 12.3 g/dL (ref 12.0–15.0)
Immature Granulocytes: 0 %
Lymphocytes Relative: 15 %
Lymphs Abs: 1 K/uL (ref 0.7–4.0)
MCH: 29.5 pg (ref 26.0–34.0)
MCHC: 31.9 g/dL (ref 30.0–36.0)
MCV: 92.3 fL (ref 80.0–100.0)
Monocytes Absolute: 0.6 K/uL (ref 0.1–1.0)
Monocytes Relative: 9 %
Neutro Abs: 4.7 K/uL (ref 1.7–7.7)
Neutrophils Relative %: 67 %
Platelets: 312 K/uL (ref 150–400)
RBC: 4.17 MIL/uL (ref 3.87–5.11)
RDW: 13.4 % (ref 11.5–15.5)
WBC: 6.9 K/uL (ref 4.0–10.5)
nRBC: 0 % (ref 0.0–0.2)

## 2024-05-17 LAB — URINALYSIS, ROUTINE W REFLEX MICROSCOPIC
Bacteria, UA: NONE SEEN
Bilirubin Urine: NEGATIVE
Glucose, UA: NEGATIVE mg/dL
Hgb urine dipstick: NEGATIVE
Ketones, ur: NEGATIVE mg/dL
Nitrite: NEGATIVE
Protein, ur: NEGATIVE mg/dL
Specific Gravity, Urine: 1.009 (ref 1.005–1.030)
pH: 6 (ref 5.0–8.0)

## 2024-05-17 LAB — LIPASE, BLOOD: Lipase: 41 U/L (ref 11–51)

## 2024-05-17 LAB — COMPREHENSIVE METABOLIC PANEL WITH GFR
ALT: 6 U/L (ref 0–44)
AST: 20 U/L (ref 15–41)
Albumin: 3.8 g/dL (ref 3.5–5.0)
Alkaline Phosphatase: 98 U/L (ref 38–126)
Anion gap: 13 (ref 5–15)
BUN: 7 mg/dL — ABNORMAL LOW (ref 8–23)
CO2: 23 mmol/L (ref 22–32)
Calcium: 9.1 mg/dL (ref 8.9–10.3)
Chloride: 100 mmol/L (ref 98–111)
Creatinine, Ser: 0.65 mg/dL (ref 0.44–1.00)
GFR, Estimated: >60 mL/min (ref >60)
Glucose, Bld: 103 mg/dL — ABNORMAL HIGH (ref 70–99)
Potassium: 3.5 mmol/L (ref 3.5–5.1)
Sodium: 136 mmol/L (ref 135–145)
Total Bilirubin: 0.3 mg/dL (ref 0.0–1.2)
Total Protein: 7 g/dL (ref 6.5–8.1)

## 2024-05-17 LAB — TROPONIN T, HIGH SENSITIVITY
Troponin T High Sensitivity: 32 ng/L — ABNORMAL HIGH (ref 0–19)
Troponin T High Sensitivity: 27 ng/L — ABNORMAL HIGH (ref 0–19)

## 2024-05-17 MED ORDER — ONDANSETRON HCL 4 MG/2ML IJ SOLN
4.0000 mg | Freq: Once | INTRAMUSCULAR | Status: AC
  Administered 2024-05-17: 4 mg via INTRAVENOUS
  Filled 2024-05-17: qty 2

## 2024-05-17 MED ORDER — PANTOPRAZOLE SODIUM 20 MG PO TBEC: 20.0000 mg | DELAYED_RELEASE_TABLET | Freq: Every day | ORAL | 1 refills | Status: AC

## 2024-05-17 MED ORDER — IOHEXOL 300 MG/ML  SOLN
75.0000 mL | Freq: Once | INTRAMUSCULAR | Status: AC | PRN
  Administered 2024-05-17: 75 mL via INTRAVENOUS

## 2024-05-17 MED ORDER — ONDANSETRON HCL 4 MG PO TABS: 4.0000 mg | ORAL_TABLET | Freq: Three times a day (TID) | ORAL | 0 refills | Status: AC | PRN

## 2024-05-17 NOTE — ED Notes (Signed)
 Patient placedd on bedpan to obtain urine specimen

## 2024-05-17 NOTE — Discharge Instructions (Signed)
 ### Emergency Room Discharge Guide     **Discharge Instructions After Your Emergency Room Visit for Nausea**      You came to the emergency room because you were having ongoing nausea. After careful evaluation, no dangerous causes were found. Your heart and lungs looked healthy on tests, and your appetite improved after starting a medicine called ondansetron . Some hardening of the arteries in your heart and aorta (the main blood vessel from your heart) was found, which is common in older adults and does not require urgent treatment if you feel well.[1][2][3][4]      **Your Medications**      - **Ondansetron  (Zofran ):** This medicine helps control nausea. Take it as directed, usually 4-8 mg by mouth every 4-8 hours as needed. Most people tolerate it well, but it can sometimes cause constipation or changes in heart rhythm. If you feel your heart racing, feel faint, or have new chest pain, let your doctor know right away.[5][6][7][8][9]      - **Pantoprazole  (Protonix ):** This medicine reduces stomach acid and helps with symptoms of reflux or heartburn. Take it as directed, usually once daily before a meal. Swallow the tablet whole; do not crush or chew it. If you have diarrhea that does not go away, or muscle cramps, contact your doctor.[10][11]      **What to Watch For**      Go to the emergency room or call your doctor if you notice:      - Severe chest pain, trouble breathing, or fainting      - New or worsening nausea, vomiting, or belly pain      - Blood in your stool or vomit      - Fast or irregular heartbeat, feeling lightheaded, or passing out      - Diarrhea that does not improve      **Tips for Managing Nausea at Home**      - Eat small, frequent meals instead of large ones.[12][13][14]      - Avoid spicy, fatty, or strong-smelling foods that make you feel sick.[12][13][14]      - Sit upright while eating and for at least 30 minutes after meals.[12][13]      - Drink fluids  slowly throughout the day.      - If constipation occurs, try to eat more fiber (fruits, vegetables, whole grains) and drink plenty of water . Your doctor may recommend a gentle laxative if needed.[12][14]      **Heart and Blood Vessel Health**      Hardening of the arteries (atherosclerosis) is common as people age. It can increase the risk of heart problems over time, but healthy habits can help:      - Stay active as you are able--walking and gentle exercise are good for your heart.[15]      - Eat a balanced diet with lots of fruits, vegetables, and whole grains.[15]      - Take your medications as prescribed.      - Keep regular appointments with your primary care doctor and specialists.      **Follow-Up Care**      You have a referral to a stomach and digestive specialist (gastroenterologist) to help find the cause of your nausea and make sure nothing serious is missed.[13][16][17] Bring a list of your medications and any new symptoms to your appointment.      **Safety and Support**      If you need help with medications, meals, or getting to appointments, ask your care  team about community resources. Family members and caregivers can help by keeping track of symptoms and making sure you take your medicines as directed.[18][19]      **Questions?**      If you have any questions about your medicines or your health, contact your doctor or pharmacist.      ### References  1. Management of Acute Coronary Syndrome in the Older Adult Population: A Scientific Statement From the [employer]. Damluji AA, Mackie SHEERER, Wang TY, et al. Circulation. 2023;147(3):e32-e62. doi:10.1161/CIR.0000000000001112. 2. High-Sensitivity Cardiac Troponin T in Geriatric Inpatients. Laurita MIU, Cesario ELINORE Randa RANDI, et al. Archives of Gerontology and Geriatrics. 2016 Jul-Aug;65:111-5. doi:10.1016/j.archger.2016.03.010. 3. Aortic Calcification Is Associated With Coronary Artery Calcification and Is a  Potential Surrogate Marker for Ischemic Heart Disease Risk: A Cross-Sectional Study. Bronwen GRADE, Mochizuki J, Okamoto S, Matsumi H, Hashimoto K. Medicine. 2022;101(29):e29875. doi:10.1097/MD.0000000000029875. 4. Comparative Performance of Subclinical Atherosclerosis Tests in Predicting Coronary Heart Disease in Asymptomatic Individuals. Ray DELENA Wille KANDICE Trygve J. European Heart Journal. 2007;28(24):2967-71. doi:10.1093/eurheartj/ehm487. 5. Ondansetron  Hydrochloride. Food and Drug Administration. Updated date: 2016-10-03. 6. Antiemesis. Occupational hygienist. Updated 2023-12-29. 7. ONDANSETRON  HYDROCHLORIDE. Food and Drug Administration. Updated date: 2010-07-17. 8. ONDANSETRON  HYDROCHLORIDE. Food and Drug Administration. Updated date: 2024-02-16. 9. 5-Hydroxytryptamine3 Receptor Antagonists and Cardiac Side Effects. Brygger L, Herrstedt J. Expert Opinion on Drug Safety. 2014;13(10):1407-22. doi:10.1517/14740338.2014.954546. 10. Protonix  . Food and Drug Administration. Updated date: 2014-07-11. 11. Protonix . Food and Drug Administration. Updated date: 2010-08-08. 12. Palliative Pharmacotherapy for Cardiovascular Disease: A Scientific Statement From the American Heart Association. Leona Roosevelt KE, Feder S, Baggenstos YT, et al. Circulation. Cardiovascular Quality and Outcomes. 2024;17(8):e000131. doi:10.1161/HCQ.0000000000000131. 13. Evaluation and Treatment of Nausea and Vomiting in Adults. Naomi ONEIDA Jerilynn FORBES. American Family Physician. 2024;109(5):417-425. 14. Palliative Care. Occupational hygienist. Updated 2023-11-17. 15. 2019 ACC/AHA Guideline on the Primary Prevention of Cardiovascular Disease: A Report of the [employer] of Cardiology/American Heart Association Task Force on Clinical Practice Guidelines. Arnett DK, Jame ALESIA Cancer MA, et al. Circulation. 2019;140(11):e596-e646. doi:10.1161/CIR.0000000000000678. 16. ACG Clinical Guideline: Gastroparesis.  Camilleri M, Kuo B, Nguyen L, et al. The American Journal of Gastroenterology. 2022;117(8):1197-1220. doi:10.14309/ajg.0000000000001874. 17. AGA Clinical Practice Update on Management of Inflammatory Bowel Disease in Elderly Patients: Expert Review. Ananthakrishnan AN, Nguyen GC, Bernstein CN. Gastroenterology. 2021;160(1):445-451. doi:10.1053/j.gastro.2020.08.060. 18. Geriatric Emergency Department Guidelines. Annals of Emergency Medicine. 2014;63(5):e7-25. doi:10.1016/j.annemergmed.2014.02.008. 19. Planning for a Safe Discharge: More Than a Capacity Evaluation. Cyrena SP, Sharda N, Zietlow KE, Heflin MT. Journal of the [employer]. 2020;68(4):859-866. doi:10.1111/jgs.83684.

## 2024-05-17 NOTE — ED Triage Notes (Signed)
 Patient arrives from home BIB ambulance.  Patient states has had nausea/weakness for the last month along with loss of appetite.  VS WMNl.  Patient reports she is so weak that she cannot get out of bed and husband called 911.

## 2024-05-17 NOTE — ED Provider Notes (Signed)
  EMERGENCY DEPARTMENT AT [facility] Provider Note   CSN: [id number] Arrival date & time: 05/17/24  1214     Patient presents with: Nausea and Weakness   Valerie Decker is a 89 y.o. female with a past medical history of Alzheimer's dementia, hypertension, hyperlipidemia brought in by EMS for evaluation of nausea and fatigue.  History is given at bedside by EMS.  The patient is also able to give some history but states I have dementia so he might not believe everything I say.  EMS reports that the patient called out for nausea.  She apparently has been nauseated for about 1 month.  She was able to ambulate from her bed to the stretcher.  She reports that she feels nauseated and weak.  She tries to eat 3 meals a day but states that she is not very hungry with her nausea.  She denies any diarrhea vomiting fevers chills or urinary symptoms.  She has no other complaints at this time.  Patient lives at home with her husband    Weakness      Prior to Admission medications   Medication Sig Start Date End Date Taking? Authorizing Provider  acetaminophen  (TYLENOL ) 500 MG tablet Take 1,000 mg by mouth every 6 (six) hours as needed for moderate pain or headache.    [provider]  budesonide -formoterol  (SYMBICORT) 160-4.5 MCG/ACT inhaler Inhale 2 puffs into the lungs 2 (two) times daily.    [provider]  citalopram  (CELEXA ) 20 MG tablet Take 20 mg by mouth daily. 05/19/14   [provider]  diltiazem  (CARDIZEM  CD) 120 MG 24 hr capsule 120 mg daily. 03/23/20   [provider]  fluticasone  (FLONASE ) 50 MCG/ACT nasal spray Place 1-2 sprays into the nose 2 (two) times daily.    [provider]  indapamide (LOZOL) 1.25 MG tablet Take 1.25 mg by mouth daily.    [provider]  levocetirizine (XYZAL ) 5 MG tablet Take 5 mg by mouth daily.    [provider]  levothyroxine  (SYNTHROID , LEVOTHROID) 25 MCG tablet TAKE 1  TABLET ONCE DAILY. Patient taking differently: Take 25 mcg by mouth daily before breakfast. 09/17/13   Tish Elsie FALCON, MD  lisinopril  (ZESTRIL ) 40 MG tablet Take 40 mg by mouth daily. 04/25/20   [provider]  montelukast  (SINGULAIR ) 10 MG tablet Take 10 mg by mouth at bedtime.    [provider]  OVER THE COUNTER MEDICATION Take 1 capsule by mouth at bedtime. OTC Relaxium sleep aid    [provider]  simvastatin  (ZOCOR ) 10 MG tablet TAKE ONE TABLET AT BEDTIME. Patient taking differently: Take 10 mg by mouth at bedtime. 09/21/13   Tish Elsie FALCON, MD    Allergies: Penicillins and Tape    Review of Systems  Neurological:  Positive for weakness.    Updated Vital Signs BP (!) 178/83 (BP Location: Left Arm)   Pulse 65   Temp 97.6 F (36.4 C)   Resp 16   SpO2 95%   Physical Exam Vitals and nursing note reviewed.  Constitutional:      General: She is not in acute distress.    Appearance: She is well-developed. She is not diaphoretic.  HENT:     Head: Normocephalic and atraumatic.     Right Ear: External ear normal.     Left Ear: External ear normal.     Nose: Nose normal.     Mouth/Throat:     Mouth: Mucous membranes  are moist.  Eyes:     General: No scleral icterus.    Extraocular Movements: Extraocular movements intact.     Conjunctiva/sclera: Conjunctivae normal.     Pupils: Pupils are equal, round, and reactive to light.  Cardiovascular:     Rate and Rhythm: Normal rate and regular rhythm.     Heart sounds: Normal heart sounds. No murmur heard.    No friction rub. No gallop.  Pulmonary:     Effort: Pulmonary effort is normal. No respiratory distress.     Breath sounds: Normal breath sounds.  Abdominal:     General: Bowel sounds are normal. There is no distension.     Palpations: Abdomen is soft. There is no mass.     Tenderness: There is no abdominal tenderness. There is no guarding.     Hernia: No hernia is present.  Musculoskeletal:      Cervical back: Normal range of motion.  Skin:    General: Skin is warm and dry.  Neurological:     Mental Status: She is alert and oriented to person, place, and time.  Psychiatric:        Behavior: Behavior normal.     (all labs ordered are listed, but only abnormal results are displayed) Labs Reviewed - No data to display  EKG: None  Radiology: No results found.   Procedures   Medications Ordered in the ED - No data to display  Clinical Course as of 05/17/24 1620  Mon May 17, 2024  1508 Troponin T High Sensitivity(!): 32 [AH]    Clinical Course User Index [AH] Arloa Chroman, PA-C                                 Medical Decision Making Amount and/or Complexity of Data Reviewed Labs: ordered. Decision-making details documented in ED Course. Radiology: ordered. ECG/medicine tests: ordered.  Risk Prescription drug management.   This patient presents to the ED for concern of nausea and fatigue, this involves an extensive number of treatment options, and is a complaint that carries with it a high risk of complications and morbidity.   The differential diagnosis of weakness includes but is not limited to neurologic causes (GBS, myasthenia gravis, CVA, MS, ALS, transverse myelitis, spinal cord injury, CVA, botulism, ) and other causes: ACS, Arrhythmia, syncope, orthostatic hypotension, sepsis, hypoglycemia, electrolyte disturbance, hypothyroidism, respiratory failure, symptomatic anemia, dehydration, heat injury, polypharmacy, malignancy.   Co morbidities:    has a past medical history of Allergic rhinitis, cause unspecified, Asthma, Carpal tunnel syndrome, Diverticulosis, Elevated serum homocysteine level, Esophageal reflux, H. pylori infection (2010), Irritable bowel syndrome, Irritable bowel syndrome with diarrhea, Mixed hyperlipidemia, Neuralgia, neuritis, and radiculitis, unspecified, Osteopenia, Unspecified asthma(493.90), Unspecified essential hypertension,  Unspecified hypothyroidism, and Unspecified sleep apnea.   Social Determinants of Health:   SDOH Screenings   Food Insecurity: No Food Insecurity (07/28/2023)  Housing: Low Risk  (07/28/2023)  Transportation Needs: No Transportation Needs (07/28/2023)  Utilities: Not At Risk (07/28/2023)  Depression (PHQ2-9): Low Risk  (04/04/2022)  Tobacco Use: Low Risk  (07/28/2023)     Additional history:  {Additional history obtained from family at bedside    Lab Tests:  I Ordered, and personally interpreted labs.  The pertinent results include:   Labs reviewed.  CMP shows no abnormalities, CBC urine and lipase all within normal limits without acute finding. Troponin T is mildly elevated at 33, 27.  Does not appear to present  acute coronary syndrome. Imaging Studies:  I ordered imaging studies including portable 1 view chest and CT abdomen and pelvis I independently visualized and interpreted imaging which showed no acute findings I agree with the radiologist interpretation  Cardiac Monitoring/ECG:  The patient was maintained on a cardiac monitor.  I personally viewed and interpreted the cardiac monitored which showed an underlying rhythm of:  Sinus rhythm at a rate of 66, no signs of ischemic abnormality at this time     Medicines ordered and prescription drug management:  I ordered medication including  Medications  ondansetron  (ZOFRAN ) injection 4 mg (4 mg Intravenous Given 05/17/24 1250)   for nausea Reevaluation of the patient after these medicines showed that the patient improved patient had a sandwich, applesauce, and juice to drink. I have reviewed the patients home medicines and have made adjustments as needed  Test Considered:    Critical Interventions:    Consultations Obtained:   Problem List / ED Course:     ICD-10-CM   1. Nausea  R11.0       MDM: Patient here due to nausea, fatigue.  Family at bedside provides additional history.  They have been unable  to get in with primary care due to her nausea and decided to have her evaluated today.  After review of all data points patient does not appear to have acute coronary syndrome, bowel obstruction, intractable vomiting.  She was given Zofran  and had total resolution of her symptoms and ate multiple things here in the emergency department is feeling greatly improved and appears appropriate for discharge at this time with close outpatient follow-up and return precautions.   Dispostion:  After consideration of the diagnostic results and the patients response to treatment, I feel that the patent would benefit from discharge.  Will discharge with Protonix  and Zofran .  GI follow-up given.      Final diagnoses:  Nausea    ED Discharge Orders     None          Arloa Chroman, PA-C 05/17/24 1626    Randol Simmonds, MD 05/19/24 MAURINE

## 2024-06-28 ENCOUNTER — Encounter: Payer: Self-pay | Admitting: Physician Assistant

## 2024-06-28 ENCOUNTER — Other Ambulatory Visit: Payer: Self-pay

## 2024-06-28 ENCOUNTER — Ambulatory Visit: Admitting: Physician Assistant

## 2024-06-28 DIAGNOSIS — M654 Radial styloid tenosynovitis [de Quervain]: Secondary | ICD-10-CM

## 2024-06-28 DIAGNOSIS — M1811 Unilateral primary osteoarthritis of first carpometacarpal joint, right hand: Secondary | ICD-10-CM

## 2024-06-28 DIAGNOSIS — M5134 Other intervertebral disc degeneration, thoracic region: Secondary | ICD-10-CM | POA: Diagnosis not present

## 2024-06-28 NOTE — Progress Notes (Signed)
 Office Visit Note   Patient: Valerie Decker           Date of Birth: 1930/11/26           MRN: [id number] Visit Date: 06/28/2024              Requested by: Chrystal Lamarr RAMAN, MD [address] [facility] [address] [place],  [place] PCP: Chrystal Lamarr RAMAN, MD  Chief Complaint  Patient presents with   Right Wrist - Pain      HPI: 89 y/o female with right thumb base swelling, stiffness and tenderness at the base of the thumb.  She is right hand dominant and has pain with certain activities of ADL's.  She denies injury to the thum and the pain has come about gradually.    She has thoracis spine pain with certain activities daily to include washing the dishes aggravates her back pain.  Her husband has been rubbing her back with hempvana cream for pain and it seems to be helping some.    Assessment & Plan: Visit Diagnoses:  1. Tenosynovitis, de Quervain   2. DDD (degenerative disc disease), thoracic   3. Arthritis of carpometacarpal (CMC) joint of right thumb     Plan: Thumb arthritis is common with aging as well as thoracic DDD changes with arthritis.  Conservative therapy includes Voltarin gel multiple times a day to the thumb base.  If her pain interferes with ADL's and rest after a trial with topicals she will call us  back and she can be referred to the hand specialist here for further work up.  She has been and will continue to use OTC Hempvana pain topical cream.    Follow-Up Instructions: Return if symptoms worsen or fail to improve.   Ortho Exam  Patient is alert, oriented, no adenopathy, well-dressed, normal affect, normal respiratory effort. Right carpometacarpal joint of the thumb with edema and pain on active motion.  Tenderness to palpation at the base of the thumb.  No pain with finger abduction or adduction.  Palpable radial pulses B.  No cellulitis or ischemic skin changes.    Forward flexion of spine with ambulation, mild kyphosis.      Imaging: T spine kyphosis with  diffuse degenerative disc and facet disease   carpometacarpal joint with osteophytes and spurring loss of joint space.     Labs: Lab Results  Component Value Date   HGBA1C 5.5 07/28/2023   HGBA1C 5.7 06/19/2006   REPTSTATUS 08/07/2020 FINAL 08/07/2020   CULT (A) 08/07/2020    <10,000 COLONIES/mL INSIGNIFICANT GROWTH Performed at [facility], [address], [place], [place] [address]    [facility] ENTEROCOCCUS SPECIES 02/27/2012     Lab Results  Component Value Date   ALBUMIN 3.8 05/17/2024   ALBUMIN 3.4 (L) 07/29/2023   ALBUMIN 4.0 02/16/2020    Lab Results  Component Value Date   MG 2.8 (H) 07/28/2023   MG 1.9 07/28/2023   Lab Results  Component Value Date   VD25OH 71 08/21/2011    No results found for: PREALBUMIN    Latest Ref Rng & Units 05/17/2024   12:35 PM 07/29/2023    3:35 AM 07/28/2023   10:35 AM  CBC EXTENDED  WBC 4.0 - 10.5 K/uL 6.9  6.7  7.6   RBC 3.87 - 5.11 MIL/uL 4.17  3.77  4.17   Hemoglobin 12.0 - 15.0 g/dL 87.6  87.9  86.6   HCT 36.0 - 46.0 % 38.5  37.4  38.7   Platelets 150 - 400 K/uL 312  284  326   NEUT# 1.7 - 7.7 K/uL 4.7     Lymph# 0.7 - 4.0 K/uL 1.0        There is no height or weight on file to calculate BMI.  Orders:  Orders Placed This Encounter  Procedures   XR Wrist Complete Right   XR Thoracic Spine 2 View   No orders of the defined types were placed in this encounter.    Procedures: No procedures performed  Clinical Data: No additional findings.  ROS:  All other systems negative, except as noted in the HPI. Review of Systems  Objective: Vital Signs: There were no vitals taken for this visit.  Specialty Comments:  No specialty comments available.  PMFS History: Patient Active Problem List   Diagnosis Date Noted   Hyponatremia 07/28/2023   Malnutrition of moderate degree 09/11/2021   S/P total right hip arthroplasty 09/11/2021   Closed right hip fracture, initial encounter (HCC) 09/10/2021    Loose stools 12/19/2016   Generalized abdominal pain 12/19/2016   Syncope and collapse 07/01/2012   Unspecified adverse effect of unspecified drug, medicinal and biological substance 03/12/2012   Pulmonary nodule 03/12/2012   OSTEOPENIA 06/05/2010   ALLERGIC RHINITIS 04/18/2009   GERD 11/08/2008   IRRITABLE BOWEL SYNDROME 10/21/2008   HYPERLIPIDEMIA 07/31/2007   SYMPTOM, APNEA, SLEEP NOS 06/09/2007   Hypothyroidism 05/14/2007   HTN (hypertension) 05/14/2007   RADICULOPATHY 01/21/2007   CARPAL TUNNEL SYNDROME 11/24/2006   Asthma 11/24/2006   Past Medical History:  Diagnosis Date   Allergic rhinitis, cause unspecified    Asthma    Carpal tunnel syndrome    Diverticulosis    Elevated serum homocysteine level    Esophageal reflux    H. pylori infection 2010   Irritable bowel syndrome    Irritable bowel syndrome with diarrhea    Mixed hyperlipidemia    Neuralgia, neuritis, and radiculitis, unspecified    Osteopenia    Unspecified asthma(493.90)    Unspecified essential hypertension    Unspecified hypothyroidism    Unspecified sleep apnea    marginal; intolerant to CPAP    Family History  Problem Relation Age of Onset   Heart failure Mother        liver disease   Liver disease Mother 34   Stroke Maternal Aunt    COPD Maternal Aunt    Lung cancer Maternal Aunt    Asthma Maternal Grandfather    Colon cancer Neg Hx     Past Surgical History:  Procedure Laterality Date   BREAST BIOPSY     COLONOSCOPY     Tics   DILATION AND CURETTAGE OF UTERUS     facial plastic surgery     TONSILLECTOMY AND ADENOIDECTOMY     ? uvulectomy   TOTAL HIP ARTHROPLASTY Right 09/11/2021   Procedure: TOTAL HIP ARTHROPLASTY ANTERIOR APPROACH;  Surgeon: Ernie Cough, MD;  Location: [facility];  Service: Orthopedics;  Laterality: Right;   UVULECTOMY     Social History   Occupational History   Not on file  Tobacco Use   Smoking status: Never    Passive exposure: Never   Smokeless tobacco:  Never   Tobacco comments:    Second hand smoke from mother & husband for decades  Vaping Use   Vaping status: Never Used  Substance and Sexual Activity   Alcohol use: No    Alcohol/week: 0.0 standard drinks of alcohol    Comment: Occasionally  has a glass of red wine with dinner   Drug use: No   Sexual activity: Not Currently
